# Patient Record
Sex: Female | Born: 1955 | Hispanic: Yes | Marital: Married | Smoking: Never smoker
Health system: Southern US, Community
[De-identification: ages and names within clinical notes are randomized; demographics above are authoritative.]

## PROBLEM LIST (undated history)

## (undated) DIAGNOSIS — N281 Cyst of kidney, acquired: Secondary | ICD-10-CM

## (undated) DIAGNOSIS — K5790 Diverticulosis of intestine, part unspecified, without perforation or abscess without bleeding: Secondary | ICD-10-CM

## (undated) DIAGNOSIS — E7800 Pure hypercholesterolemia, unspecified: Secondary | ICD-10-CM

## (undated) DIAGNOSIS — I2510 Atherosclerotic heart disease of native coronary artery without angina pectoris: Secondary | ICD-10-CM

## (undated) DIAGNOSIS — G4733 Obstructive sleep apnea (adult) (pediatric): Secondary | ICD-10-CM

## (undated) DIAGNOSIS — Z9889 Other specified postprocedural states: Secondary | ICD-10-CM

## (undated) DIAGNOSIS — I471 Supraventricular tachycardia, unspecified: Secondary | ICD-10-CM

## (undated) DIAGNOSIS — E559 Vitamin D deficiency, unspecified: Secondary | ICD-10-CM

## (undated) DIAGNOSIS — I2089 Other forms of angina pectoris: Secondary | ICD-10-CM

## (undated) DIAGNOSIS — N393 Stress incontinence (female) (male): Secondary | ICD-10-CM

## (undated) DIAGNOSIS — A048 Other specified bacterial intestinal infections: Secondary | ICD-10-CM

## (undated) DIAGNOSIS — I5181 Takotsubo syndrome: Secondary | ICD-10-CM

## (undated) DIAGNOSIS — I701 Atherosclerosis of renal artery: Secondary | ICD-10-CM

## (undated) DIAGNOSIS — K219 Gastro-esophageal reflux disease without esophagitis: Secondary | ICD-10-CM

## (undated) DIAGNOSIS — F419 Anxiety disorder, unspecified: Secondary | ICD-10-CM

## (undated) DIAGNOSIS — I10 Essential (primary) hypertension: Secondary | ICD-10-CM

## (undated) DIAGNOSIS — R000 Tachycardia, unspecified: Secondary | ICD-10-CM

## (undated) DIAGNOSIS — N644 Mastodynia: Secondary | ICD-10-CM

## (undated) DIAGNOSIS — E871 Hypo-osmolality and hyponatremia: Secondary | ICD-10-CM

## (undated) DIAGNOSIS — I5189 Other ill-defined heart diseases: Secondary | ICD-10-CM

## (undated) DIAGNOSIS — G4700 Insomnia, unspecified: Secondary | ICD-10-CM

## (undated) DIAGNOSIS — I219 Acute myocardial infarction, unspecified: Secondary | ICD-10-CM

## (undated) DIAGNOSIS — G894 Chronic pain syndrome: Secondary | ICD-10-CM

## (undated) DIAGNOSIS — E785 Hyperlipidemia, unspecified: Secondary | ICD-10-CM

## (undated) DIAGNOSIS — I480 Paroxysmal atrial fibrillation: Secondary | ICD-10-CM

## (undated) DIAGNOSIS — N993 Prolapse of vaginal vault after hysterectomy: Secondary | ICD-10-CM

## (undated) DIAGNOSIS — R0600 Dyspnea, unspecified: Secondary | ICD-10-CM

## (undated) DIAGNOSIS — I252 Old myocardial infarction: Secondary | ICD-10-CM

## (undated) DIAGNOSIS — R7303 Prediabetes: Secondary | ICD-10-CM

## (undated) HISTORY — DX: Old myocardial infarction: I25.2

## (undated) HISTORY — DX: Essential (primary) hypertension: I10

## (undated) HISTORY — DX: Paroxysmal atrial fibrillation: I48.0

## (undated) HISTORY — DX: Atherosclerosis of renal artery: I70.1

## (undated) HISTORY — DX: Hyperlipidemia, unspecified: E78.5

## (undated) HISTORY — DX: Other specified postprocedural states: Z98.890

## (undated) HISTORY — DX: Stress incontinence (female) (male): N39.3

## (undated) HISTORY — DX: Prolapse of vaginal vault after hysterectomy: N99.3

## (undated) HISTORY — DX: Mastodynia: N64.4

## (undated) HISTORY — DX: Tachycardia, unspecified: R00.0

## (undated) HISTORY — PX: VAGINAL PROLAPSE REPAIR: SHX830

## (undated) HISTORY — PX: CLAVICLE SURGERY: SHX598

## (undated) HISTORY — DX: Acute myocardial infarction, unspecified: I21.9

---

## 1996-05-26 DIAGNOSIS — I219 Acute myocardial infarction, unspecified: Secondary | ICD-10-CM

## 1996-05-26 DIAGNOSIS — I2510 Atherosclerotic heart disease of native coronary artery without angina pectoris: Secondary | ICD-10-CM

## 1996-05-26 DIAGNOSIS — I252 Old myocardial infarction: Secondary | ICD-10-CM

## 1996-05-26 HISTORY — PX: NECK SURGERY: SHX720

## 1996-05-26 HISTORY — PX: CORONARY BALLOON ANGIOPLASTY: CATH118233

## 1996-05-26 HISTORY — DX: Atherosclerotic heart disease of native coronary artery without angina pectoris: I25.10

## 1996-05-26 HISTORY — DX: Acute myocardial infarction, unspecified: I21.9

## 1996-05-26 HISTORY — DX: Old myocardial infarction: I25.2

## 1996-05-26 HISTORY — PX: CERVICAL SPINE SURGERY: SHX589

## 1998-08-16 ENCOUNTER — Observation Stay (HOSPITAL_COMMUNITY): Admission: RE | Admit: 1998-08-16 | Discharge: 1998-08-17 | Payer: Self-pay | Admitting: Neurosurgery

## 1998-08-16 ENCOUNTER — Encounter: Payer: Self-pay | Admitting: Neurosurgery

## 1998-09-07 ENCOUNTER — Ambulatory Visit (HOSPITAL_COMMUNITY): Admission: RE | Admit: 1998-09-07 | Discharge: 1998-09-07 | Payer: Self-pay | Admitting: Neurosurgery

## 2000-11-23 DIAGNOSIS — I214 Non-ST elevation (NSTEMI) myocardial infarction: Secondary | ICD-10-CM

## 2000-11-23 HISTORY — DX: Non-ST elevation (NSTEMI) myocardial infarction: I21.4

## 2000-11-23 HISTORY — PX: CORONARY BALLOON ANGIOPLASTY: CATH118233

## 2000-12-22 DIAGNOSIS — I2542 Coronary artery dissection: Secondary | ICD-10-CM

## 2000-12-22 HISTORY — DX: Coronary artery dissection: I25.42

## 2002-05-26 HISTORY — PX: ABDOMINAL HYSTERECTOMY: SHX81

## 2003-04-26 HISTORY — PX: LEFT HEART CATH AND CORONARY ANGIOGRAPHY: CATH118249

## 2004-05-30 ENCOUNTER — Ambulatory Visit: Payer: Self-pay | Admitting: Unknown Physician Specialty

## 2005-07-07 ENCOUNTER — Ambulatory Visit: Payer: Self-pay | Admitting: Internal Medicine

## 2008-10-05 ENCOUNTER — Ambulatory Visit: Payer: Self-pay

## 2012-09-08 ENCOUNTER — Ambulatory Visit: Payer: Self-pay

## 2012-09-13 ENCOUNTER — Ambulatory Visit: Payer: Self-pay

## 2012-10-05 ENCOUNTER — Encounter: Payer: Self-pay | Admitting: General Surgery

## 2012-10-05 ENCOUNTER — Other Ambulatory Visit: Payer: Self-pay

## 2012-10-05 ENCOUNTER — Ambulatory Visit (INDEPENDENT_AMBULATORY_CARE_PROVIDER_SITE_OTHER): Payer: Medicaid Other | Admitting: General Surgery

## 2012-10-05 VITALS — BP 120/70 | HR 78 | Resp 14 | Ht 64.0 in | Wt 157.0 lb

## 2012-10-05 DIAGNOSIS — N644 Mastodynia: Secondary | ICD-10-CM

## 2012-10-05 DIAGNOSIS — N63 Unspecified lump in unspecified breast: Secondary | ICD-10-CM

## 2012-10-05 NOTE — Patient Instructions (Addendum)
The patient is aware to use an antiinflammatory of  Aleve 2 times aday for an month.

## 2012-10-05 NOTE — Progress Notes (Signed)
Patient ID: Jenny Lee, female   DOB: [birth year], 57 y.o.   MRN: [id number]  Chief Complaint  Patient presents with  . Other    left breast pain    HPI Jenny Lee is a 57 y.o. female here today for left breast pain. Patient states the pain has been going on for three months in her upper outer quadrant. Patient had her mammogram 09/13/12 cat 2 . Never had any breast problems. Her sister was diagnosed with breast cancer at the age 25. The patient denies any history of trauma to the breast.  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   HPI  Past Medical History  Diagnosis Date  . Heart attack     2001    Past Surgical History  Procedure Laterality Date  . Abdominal hysterectomy  2004  . Neck surgery  1998    Family History  Problem Relation Age of Onset  . Breast cancer Sister 74    Social History History  Substance Use Topics  . Smoking status: Current Every Day Smoker -- 0.50 packs/day for 5 years  . Smokeless tobacco: Never Used  . Alcohol Use: No    No Known Allergies  Current Outpatient Prescriptions  Medication Sig Dispense Refill  . aspirin 81 MG tablet Take 81 mg by mouth daily.      Marland Kitchen estradiol (ESTRACE) 0.5 MG tablet Take 0.5 mg by mouth daily.       No current facility-administered medications for this visit.    Review of Systems Review of Systems  Constitutional: Negative.   Respiratory: Negative.   Cardiovascular: Negative.     Blood pressure 120/70, pulse 78, resp. rate 14, height 5\' 4"  (1.626 m), weight 157 lb (71.215 kg).  Physical Exam Physical Exam  Constitutional: She appears well-developed and well-nourished.  Neck: Neck supple.  Cardiovascular: Normal rate, regular rhythm and normal heart sounds.   Pulmonary/Chest: Effort normal and breath sounds normal. Right breast exhibits no inverted nipple, no mass, no nipple discharge, no skin change and no tenderness. Left breast exhibits no inverted nipple,  no mass, no nipple discharge, no skin change and no tenderness. Breasts are symmetrical.  Lymphadenopathy:       Right axillary: No pectoral and no lateral adenopathy present.  Tender at the base of the left axilla   There is mild tenderness in the axillary tail bilaterally. This is slightly more pronounced in the left axillary tail. There is also some mild tenderness along serrate his muscle on the left side. There is mild thickening in the axillary tail on the left more so than the right.  Data Reviewed Bilateral mammograms dated 09/13/2012 were compared to previous studies completed in 2010. No discernible mass effect, architectural distortion or microcalcifications of concern identified. Heterogeneously dense. BI-RAD-2.  Ultrasound examination was completed of the upper-outer quadrant of the left breast due to the patient's report of pain and focal thickening on exam. Examination in the 1:00 position 10 cm from the nipple showed a irregular isoechoic/hyperechoic area with focal shadowing measuring 0.78 x 1.16 x 1.4 cm. This was near the periphery of the breast parenchyma towards the axilla. No other cystic or solid areas were appreciated. There is mild tenderness with compression by the ultrasound probe over this area.  2006 hysterectomy pathology: SECRETORY ENDOMETRIUM, BODY OF UTERUS (MORCELLATED/SUPRACERVICAL). ADENOMYOSIS. OVARY WITH CORPUS LUTEUM CYST, FOLLICULAR CYSTS AND FIBROUS ADHESIONS, LEFT. FALLOPIAN TUBE WITH FIBROUS ADHESIONS, LEFT. KCT/05/31/2004 .                               [  Final Report         ]                   Electronically signed:                                          Skip Mayer, MD, FCAP, Pathologist   Assessment    The ultrasound findings are indeterminate, and may be related to focal irritation.    Plan    The patient has been asked to make use of a trial Aleve 2 tablets twice a day with a small amount of food for the next month. We'll  plan for reassessment 6 weeks to see her response to conservative treatment.       Jenny Lee 10/06/2012, 7:25 AM

## 2012-10-06 ENCOUNTER — Encounter: Payer: Self-pay | Admitting: General Surgery

## 2012-10-06 DIAGNOSIS — N644 Mastodynia: Secondary | ICD-10-CM | POA: Insufficient documentation

## 2012-10-06 HISTORY — DX: Mastodynia: N64.4

## 2012-11-16 ENCOUNTER — Ambulatory Visit: Payer: Medicaid Other | Admitting: General Surgery

## 2012-12-14 ENCOUNTER — Encounter: Payer: Self-pay | Admitting: *Deleted

## 2014-03-27 ENCOUNTER — Encounter: Payer: Self-pay | Admitting: General Surgery

## 2014-08-15 DIAGNOSIS — N393 Stress incontinence (female) (male): Secondary | ICD-10-CM | POA: Insufficient documentation

## 2014-08-15 DIAGNOSIS — N993 Prolapse of vaginal vault after hysterectomy: Secondary | ICD-10-CM

## 2014-08-15 DIAGNOSIS — Z01818 Encounter for other preprocedural examination: Secondary | ICD-10-CM | POA: Insufficient documentation

## 2014-08-15 DIAGNOSIS — I252 Old myocardial infarction: Secondary | ICD-10-CM

## 2014-08-15 HISTORY — DX: Old myocardial infarction: I25.2

## 2014-08-15 HISTORY — DX: Prolapse of vaginal vault after hysterectomy: N99.3

## 2014-08-15 HISTORY — DX: Stress incontinence (female) (male): N39.3

## 2015-10-31 ENCOUNTER — Ambulatory Visit (INDEPENDENT_AMBULATORY_CARE_PROVIDER_SITE_OTHER): Payer: BLUE CROSS/BLUE SHIELD | Admitting: Podiatry

## 2015-10-31 ENCOUNTER — Ambulatory Visit (INDEPENDENT_AMBULATORY_CARE_PROVIDER_SITE_OTHER): Payer: BLUE CROSS/BLUE SHIELD

## 2015-10-31 ENCOUNTER — Ambulatory Visit: Payer: Self-pay

## 2015-10-31 ENCOUNTER — Encounter: Payer: Self-pay | Admitting: Podiatry

## 2015-10-31 VITALS — BP 132/85 | HR 73 | Resp 16

## 2015-10-31 DIAGNOSIS — M898X9 Other specified disorders of bone, unspecified site: Secondary | ICD-10-CM | POA: Diagnosis not present

## 2015-10-31 DIAGNOSIS — Q828 Other specified congenital malformations of skin: Secondary | ICD-10-CM | POA: Diagnosis not present

## 2015-10-31 DIAGNOSIS — B079 Viral wart, unspecified: Secondary | ICD-10-CM | POA: Diagnosis not present

## 2015-10-31 DIAGNOSIS — M2010 Hallux valgus (acquired), unspecified foot: Secondary | ICD-10-CM | POA: Diagnosis not present

## 2015-10-31 DIAGNOSIS — M2040 Other hammer toe(s) (acquired), unspecified foot: Secondary | ICD-10-CM | POA: Diagnosis not present

## 2015-10-31 DIAGNOSIS — M779 Enthesopathy, unspecified: Secondary | ICD-10-CM

## 2015-10-31 DIAGNOSIS — M79671 Pain in right foot: Secondary | ICD-10-CM

## 2015-10-31 NOTE — Progress Notes (Signed)
   Subjective:    Patient ID: Jenny Lee, female    DOB: [birth year], 60 y.o.   MRN: [id number]  HPI: She presents today with chief complaint of a painful nodule beneath the hallux of the right foot. She states it is been there before and has been burned off by another doctor but it came back. She is wondering if there is anything we can do to prevent it from coming back again. She's also has pain beneath her second metatarsophalangeal joints bilaterally left worse than the right she says these calluses are painful and she refers to reactive hyperkeratotic lesion plantar aspect of the bilateral second metatarsals. She states that it hurts to bend her second toe on the left foot. She's also states that it hurts to bend all of her toes 2 through 4 on both feet at the level of the PIPJ's because they're stiff. 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    Review of Systems  All other systems reviewed and are negative.      Objective:   Physical Exam: Vital signs are stable alert and oriented 3 pulses are strongly palpable. Neurologic sensorium is intact. Deep tendon reflexes are intact. Muscle strength +5 over 5 dorsiflexion plantar flexors and inverters everters all into the musculature is intact. Orthopedic evaluation and x-rays all joints distal to the ankle range of motion without crepitus she has hallux abductovalgus deformity and rigid hammertoe deformities bilateral. She has pain on end range of motion the second metatarsophalangeal joint of the left foot with some edema in the area. No erythema cellulitis drainage or odor no open lesions or wounds. Cutaneous evaluation does demonstrate broad callus or tyloma beneath the second metatarsophalangeal joints bilaterally. She also has a raised keratotic lesion on the plantar aspect of the proximal phalanx hallux right is it is exquisitely tender. I do not demonstrate any type of thrombosed capillaries this appears to be a  porokeratotic or cornified lesion I do think that this is something that we should excise and sent for pathology.        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 Verrucoid lesion versus porokeratotic lesion plantar aspect of the hallux right.  Plan: I injected her second metatarsophalangeal joint periarticular Truman Hayward today with Kenalog and local anesthetic left foot. I also had her scan for a set of orthotics. I also performed surgical curettage to the hallux right. And debridement all reactive hyperkeratotic lesions. She was provided with both oral and written home going instructions for care and soaking of her toe and I will follow-up with her in a couple of weeks for reevaluation lesion was then sent for pathologic evaluation.

## 2015-10-31 NOTE — Patient Instructions (Signed)

## 2015-11-12 ENCOUNTER — Ambulatory Visit (INDEPENDENT_AMBULATORY_CARE_PROVIDER_SITE_OTHER): Payer: BLUE CROSS/BLUE SHIELD | Admitting: Podiatry

## 2015-11-12 ENCOUNTER — Encounter: Payer: Self-pay | Admitting: Podiatry

## 2015-11-12 DIAGNOSIS — B079 Viral wart, unspecified: Secondary | ICD-10-CM

## 2015-11-12 NOTE — Patient Instructions (Signed)

## 2015-11-12 NOTE — Progress Notes (Addendum)
She presents today for follow-up of a painful wart excision right forefoot She states is still sore. She states it is still sore and she can believe that is growing back already.  Objective: Vital signs are stable she is alert and oriented 3. Pulses are palpable. Neurologic sensorium is intact. No erythema edema cellulose drainage or odor. The excision site appears to be healing uneventfully. There are hyperkeratotic plug is already present.  Assessment: well-healing surgical foot right.  Plan: I debrided the area today and placed small amount of Cantharone to assistant blistering the wound. She still leave this on until tomorrow washed off thoroughly.

## 2015-11-20 ENCOUNTER — Telehealth: Payer: Self-pay | Admitting: *Deleted

## 2015-11-20 NOTE — Telephone Encounter (Addendum)
Dr. Milinda Pointer reviewed 10/31/2015 biopsy results as wart.  Unable to leave a message number had been disconnected. Left message for pt to call for results. Pt returned my call, informed pt of Dr. Stephenie Acres statement.  Pt states the surgery site is doing fine.  Gave pt next appt date as 12/07/2015 at 1100am to pick up orthotics in [place].

## 2015-11-25 ENCOUNTER — Observation Stay
Admission: EM | Admit: 2015-11-25 | Discharge: 2015-11-27 | Disposition: A | Discharge status: Home / Self Care | Payer: BLUE CROSS/BLUE SHIELD | Attending: Internal Medicine | Admitting: Internal Medicine

## 2015-11-25 DIAGNOSIS — Z7982 Long term (current) use of aspirin: Secondary | ICD-10-CM | POA: Diagnosis not present

## 2015-11-25 DIAGNOSIS — I10 Essential (primary) hypertension: Secondary | ICD-10-CM | POA: Diagnosis not present

## 2015-11-25 DIAGNOSIS — I253 Aneurysm of heart: Secondary | ICD-10-CM | POA: Diagnosis not present

## 2015-11-25 DIAGNOSIS — I252 Old myocardial infarction: Secondary | ICD-10-CM | POA: Insufficient documentation

## 2015-11-25 DIAGNOSIS — I25119 Atherosclerotic heart disease of native coronary artery with unspecified angina pectoris: Secondary | ICD-10-CM | POA: Diagnosis present

## 2015-11-25 DIAGNOSIS — R55 Syncope and collapse: Secondary | ICD-10-CM | POA: Diagnosis not present

## 2015-11-25 DIAGNOSIS — R079 Chest pain, unspecified: Secondary | ICD-10-CM

## 2015-11-25 DIAGNOSIS — Z79890 Hormone replacement therapy (postmenopausal): Secondary | ICD-10-CM | POA: Diagnosis not present

## 2015-11-25 DIAGNOSIS — E785 Hyperlipidemia, unspecified: Secondary | ICD-10-CM | POA: Insufficient documentation

## 2015-11-25 DIAGNOSIS — F329 Major depressive disorder, single episode, unspecified: Secondary | ICD-10-CM | POA: Insufficient documentation

## 2015-11-25 DIAGNOSIS — Z79899 Other long term (current) drug therapy: Secondary | ICD-10-CM | POA: Insufficient documentation

## 2015-11-25 DIAGNOSIS — F17210 Nicotine dependence, cigarettes, uncomplicated: Secondary | ICD-10-CM | POA: Insufficient documentation

## 2015-11-25 DIAGNOSIS — Z23 Encounter for immunization: Secondary | ICD-10-CM | POA: Insufficient documentation

## 2015-11-25 DIAGNOSIS — E7800 Pure hypercholesterolemia, unspecified: Secondary | ICD-10-CM | POA: Insufficient documentation

## 2015-11-25 HISTORY — DX: Essential (primary) hypertension: I10

## 2015-11-25 HISTORY — DX: Pure hypercholesterolemia, unspecified: E78.00

## 2015-11-26 ENCOUNTER — Emergency Department: Payer: BLUE CROSS/BLUE SHIELD

## 2015-11-26 ENCOUNTER — Encounter
Admission: EM | Disposition: A | Discharge status: Home / Self Care | Payer: Self-pay | Source: Home / Self Care | Attending: Internal Medicine

## 2015-11-26 ENCOUNTER — Encounter: Payer: Self-pay | Admitting: Emergency Medicine

## 2015-11-26 DIAGNOSIS — Z9889 Other specified postprocedural states: Secondary | ICD-10-CM

## 2015-11-26 DIAGNOSIS — R55 Syncope and collapse: Secondary | ICD-10-CM | POA: Diagnosis present

## 2015-11-26 HISTORY — DX: Other specified postprocedural states: Z98.890

## 2015-11-26 HISTORY — PX: CARDIAC CATHETERIZATION: SHX172

## 2015-11-26 HISTORY — DX: Syncope and collapse: R55

## 2015-11-26 LAB — CBC WITH DIFFERENTIAL/PLATELET
BASOS PCT: 1 %
Basophils Absolute: 0.1 10*3/uL (ref 0–0.1)
EOS ABS: 0.1 10*3/uL (ref 0–0.7)
EOS PCT: 1 %
HCT: 36 % (ref 35.0–47.0)
Hemoglobin: 12.4 g/dL (ref 12.0–16.0)
LYMPHS ABS: 3.6 10*3/uL (ref 1.0–3.6)
Lymphocytes Relative: 37 %
MCH: 28.9 pg (ref 26.0–34.0)
MCHC: 34.3 g/dL (ref 32.0–36.0)
MCV: 84.3 fL (ref 80.0–100.0)
Monocytes Absolute: 0.6 10*3/uL (ref 0.2–0.9)
Monocytes Relative: 6 %
Neutro Abs: 5.3 10*3/uL (ref 1.4–6.5)
Neutrophils Relative %: 55 %
PLATELETS: 229 10*3/uL (ref 150–440)
RBC: 4.28 MIL/uL (ref 3.80–5.20)
RDW: 14 % (ref 11.5–14.5)
WBC: 9.8 10*3/uL (ref 3.6–11.0)

## 2015-11-26 LAB — COMPREHENSIVE METABOLIC PANEL
ALBUMIN: 4.3 g/dL (ref 3.5–5.0)
ALT: 19 U/L (ref 14–54)
ANION GAP: 6 (ref 5–15)
AST: 25 U/L (ref 15–41)
Alkaline Phosphatase: 60 U/L (ref 38–126)
BUN: 16 mg/dL (ref 6–20)
CHLORIDE: 105 mmol/L (ref 101–111)
CO2: 26 mmol/L (ref 22–32)
Calcium: 9.2 mg/dL (ref 8.9–10.3)
Creatinine, Ser: 0.76 mg/dL (ref 0.44–1.00)
GFR calc non Af Amer: >60 mL/min (ref >60)
GLUCOSE: 152 mg/dL — AB (ref 65–99)
Potassium: 3.6 mmol/L (ref 3.5–5.1)
SODIUM: 137 mmol/L (ref 135–145)
Total Bilirubin: 0.4 mg/dL (ref 0.3–1.2)
Total Protein: 7.2 g/dL (ref 6.5–8.1)

## 2015-11-26 LAB — ETHANOL: Alcohol, Ethyl (B): <5 mg/dL (ref <5)

## 2015-11-26 LAB — TROPONIN I
Troponin I: <0.03 ng/mL (ref <0.03)
Troponin I: <0.03 ng/mL (ref <0.03)
Troponin I: <0.03 ng/mL (ref <0.03)

## 2015-11-26 LAB — TSH: TSH: 1.073 u[IU]/mL (ref 0.350–4.500)

## 2015-11-26 LAB — HEMOGLOBIN A1C: Hgb A1c MFr Bld: 6.2 % — ABNORMAL HIGH (ref 4.0–6.0)

## 2015-11-26 SURGERY — LEFT HEART CATH AND CORONARY ANGIOGRAPHY
Anesthesia: Moderate Sedation

## 2015-11-26 MED ORDER — MIDAZOLAM HCL 2 MG/2ML IJ SOLN
INTRAMUSCULAR | Status: AC
  Filled 2015-11-26: qty 2

## 2015-11-26 MED ORDER — SIMVASTATIN 20 MG PO TABS
20.0000 mg | ORAL_TABLET | Freq: Every day | ORAL | Status: DC
  Administered 2015-11-26: 20 mg via ORAL
  Filled 2015-11-26: qty 1

## 2015-11-26 MED ORDER — ACETAMINOPHEN 650 MG RE SUPP: 650.0000 mg | Freq: Four times a day (QID) | RECTAL | Status: DC | PRN

## 2015-11-26 MED ORDER — FENTANYL CITRATE (PF) 100 MCG/2ML IJ SOLN
INTRAMUSCULAR | Status: AC
  Filled 2015-11-26: qty 2

## 2015-11-26 MED ORDER — SODIUM CHLORIDE 0.9% FLUSH: 3.0000 mL | Freq: Two times a day (BID) | INTRAVENOUS | Status: DC

## 2015-11-26 MED ORDER — ONDANSETRON HCL 4 MG/2ML IJ SOLN: 4.0000 mg | Freq: Four times a day (QID) | INTRAMUSCULAR | Status: DC | PRN

## 2015-11-26 MED ORDER — MORPHINE SULFATE (PF) 2 MG/ML IV SOLN: 2.0000 mg | INTRAVENOUS | Status: DC | PRN

## 2015-11-26 MED ORDER — SODIUM CHLORIDE 0.9 % WEIGHT BASED INFUSION: 3.0000 mL/kg/h | INTRAVENOUS | Status: DC

## 2015-11-26 MED ORDER — SODIUM CHLORIDE 0.9 % WEIGHT BASED INFUSION: 1.0000 mL/kg/h | INTRAVENOUS | Status: DC

## 2015-11-26 MED ORDER — SODIUM CHLORIDE 0.9 % WEIGHT BASED INFUSION: 1.0000 mL/kg/h | INTRAVENOUS | Status: AC

## 2015-11-26 MED ORDER — ESTRADIOL 1 MG PO TABS
0.5000 mg | ORAL_TABLET | Freq: Every day | ORAL | Status: DC
Start: 2015-11-26 — End: 2015-11-27
  Administered 2015-11-26 – 2015-11-27 (×2): 0.5 mg via ORAL
  Filled 2015-11-26: qty 1
  Filled 2015-11-26: qty 0.5

## 2015-11-26 MED ORDER — ONDANSETRON HCL 4 MG PO TABS: 4.0000 mg | ORAL_TABLET | Freq: Four times a day (QID) | ORAL | Status: DC | PRN

## 2015-11-26 MED ORDER — ENOXAPARIN SODIUM 40 MG/0.4ML ~~LOC~~ SOLN
40.0000 mg | SUBCUTANEOUS | Status: DC
  Administered 2015-11-26: 40 mg via SUBCUTANEOUS
  Filled 2015-11-26: qty 0.4

## 2015-11-26 MED ORDER — DOCUSATE SODIUM 100 MG PO CAPS
100.0000 mg | ORAL_CAPSULE | Freq: Two times a day (BID) | ORAL | Status: DC
  Administered 2015-11-26 – 2015-11-27 (×3): 100 mg via ORAL
  Filled 2015-11-26 (×3): qty 1

## 2015-11-26 MED ORDER — ACETAMINOPHEN 325 MG PO TABS
650.0000 mg | ORAL_TABLET | Freq: Four times a day (QID) | ORAL | Status: DC | PRN
Start: 2015-11-26 — End: 2015-11-27
  Administered 2015-11-26 (×2): 650 mg via ORAL
  Filled 2015-11-26 (×2): qty 2

## 2015-11-26 MED ORDER — IOPAMIDOL (ISOVUE-300) INJECTION 61%
INTRAVENOUS | Status: DC | PRN
  Administered 2015-11-26: 80 mL via INTRA_ARTERIAL

## 2015-11-26 MED ORDER — ASPIRIN 81 MG PO CHEW: 81.0000 mg | CHEWABLE_TABLET | ORAL | Status: DC

## 2015-11-26 MED ORDER — HEPARIN (PORCINE) IN NACL 2-0.9 UNIT/ML-% IJ SOLN
INTRAMUSCULAR | Status: AC
  Filled 2015-11-26: qty 500

## 2015-11-26 MED ORDER — PNEUMOCOCCAL VAC POLYVALENT 25 MCG/0.5ML IJ INJ
0.5000 mL | INJECTION | INTRAMUSCULAR | Status: AC
  Administered 2015-11-27: 0.5 mL via INTRAMUSCULAR
  Filled 2015-11-26: qty 0.5

## 2015-11-26 MED ORDER — SODIUM CHLORIDE 0.9% FLUSH: 3.0000 mL | INTRAVENOUS | Status: DC | PRN

## 2015-11-26 MED ORDER — ASPIRIN EC 81 MG PO TBEC
81.0000 mg | DELAYED_RELEASE_TABLET | Freq: Every day | ORAL | Status: DC
  Administered 2015-11-26 – 2015-11-27 (×2): 81 mg via ORAL
  Filled 2015-11-26 (×2): qty 1

## 2015-11-26 MED ORDER — SODIUM CHLORIDE 0.9 % IV SOLN: 250.0000 mL | INTRAVENOUS | Status: DC | PRN

## 2015-11-26 MED ORDER — SODIUM CHLORIDE 0.9% FLUSH
3.0000 mL | Freq: Two times a day (BID) | INTRAVENOUS | Status: DC
  Administered 2015-11-26 – 2015-11-27 (×2): 3 mL via INTRAVENOUS

## 2015-11-26 MED ORDER — OMEGA-3-ACID ETHYL ESTERS 1 G PO CAPS: 2.0000 g | ORAL_CAPSULE | Freq: Every day | ORAL | Status: DC

## 2015-11-26 MED ORDER — MIDAZOLAM HCL 2 MG/2ML IJ SOLN
INTRAMUSCULAR | Status: DC | PRN
  Administered 2015-11-26 (×2): 1 mg via INTRAVENOUS

## 2015-11-26 MED ORDER — METOPROLOL TARTRATE 25 MG PO TABS
25.0000 mg | ORAL_TABLET | Freq: Every day | ORAL | Status: DC
  Administered 2015-11-26 – 2015-11-27 (×2): 25 mg via ORAL
  Filled 2015-11-26 (×2): qty 1

## 2015-11-26 MED ORDER — ASPIRIN 81 MG PO CHEW
324.0000 mg | CHEWABLE_TABLET | Freq: Once | ORAL | Status: AC
  Administered 2015-11-26: 324 mg via ORAL
  Filled 2015-11-26: qty 4

## 2015-11-26 MED ORDER — FENTANYL CITRATE (PF) 100 MCG/2ML IJ SOLN
INTRAMUSCULAR | Status: DC | PRN
  Administered 2015-11-26 (×2): 25 ug via INTRAVENOUS

## 2015-11-26 MED ORDER — ACETAMINOPHEN 325 MG PO TABS: 650.0000 mg | ORAL_TABLET | ORAL | Status: DC | PRN

## 2015-11-26 MED ORDER — SERTRALINE HCL 50 MG PO TABS
50.0000 mg | ORAL_TABLET | Freq: Every day | ORAL | Status: DC
  Administered 2015-11-26: 50 mg via ORAL
  Filled 2015-11-26: qty 1

## 2015-11-26 SURGICAL SUPPLY — 9 items
CATH INFINITI 5FR ANG PIGTAIL (CATHETERS) ×2 IMPLANT
CATH INFINITI 5FR JL4 (CATHETERS) ×2 IMPLANT
CATH INFINITI JR4 5F (CATHETERS) ×2 IMPLANT
KIT MANI 3VAL PERCEP (MISCELLANEOUS) ×3 IMPLANT
NDL PERC 18GX7CM (NEEDLE) IMPLANT
NEEDLE PERC 18GX7CM (NEEDLE) ×3 IMPLANT
PACK CARDIAC CATH (CUSTOM PROCEDURE TRAY) ×3 IMPLANT
SHEATH PINNACLE 5F 10CM (SHEATH) ×2 IMPLANT
WIRE EMERALD 3MM-J .035X150CM (WIRE) ×2 IMPLANT

## 2015-11-26 NOTE — ED Notes (Signed)
Per EMS, patient was home sitting in a chair and all of a sudden she felt flush and diaphoretic.  Patient felt weak and was sliding out of her chair but her son kept her from hitting the floor.  Patient denies LOC.  She presents AOx4 and able to answer questions.  She is spooked by this happening.  EMS says this patient had 4 MI's at age 60.  She has been taking a prescription for neck muscle spasms and is on her second refill.  The family is trying to get the name of the medication.

## 2015-11-26 NOTE — Progress Notes (Signed)
Patient had cardiac catheterization done without complication and findings revealed normal coronaries but left radical ejection fraction 35%. 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 Advise lifevest which has been arranged with the nurse manager. Patient should not be discharged and to lifevest has been installed. Also will start the patient on Entresto.

## 2015-11-26 NOTE — ED Notes (Signed)
XR at bedside

## 2015-11-26 NOTE — Discharge Summary (Addendum)
[facility] NAME: Jenny Lee    MR#:  [id number]  DATE OF BIRTH:  16-Jul-1955  DATE OF ADMISSION:  11/25/2015 ADMITTING PHYSICIAN: Harrie Foreman, MD  DATE OF DISCHARGE: 11/26/2015  PRIMARY CARE PHYSICIAN: Casilda Carls, MD    ADMISSION DIAGNOSIS:  Near syncope [R55] Coronary artery disease involving native coronary artery of native heart with angina pectoris ([place]) [I25.119]  DISCHARGE DIAGNOSIS:  Active Problems:   Syncope    CAD ruled out but have aneurysm on apical region and Low EF  SECONDARY DIAGNOSIS:   Past Medical History  Diagnosis Date  . Heart attack (Jenny Lee)     2001  . Hypertension   . Hypercholesteremia     HOSPITAL COURSE:   1. Syncope:   Telemetry and troponin negative   She had a negative stress test recently in the office so that Dr. Humphrey Rolls decided to do cardiac catheterization, which showed decreased ejection fraction and aneurysm in the Apical region. Sensory suggested to discharge with LifeVest and and enteresto, advised - but her Blood pressure is running on lower normal side, so we will not start now, it can be done in future , if BP stable.    2. Coronary artery disease: Stable; no chest pain. Troponin is negative and EKG shows no signs of ischemia. Patient reports that she recently underwent an exercise stress test which was normal. Cardiology has been consulted, cath done. Continue aspirin 3. Essential hypertension: Controlled; continue metoprolol. 4. Hyperlipidemia: Continue statin therapy 5. Depression: Continue Zoloft 6. DVT prophylaxis: Lovenox 7. GI prophylaxis: None  DISCHARGE CONDITIONS:   Stable.  CONSULTS OBTAINED:  Treatment Team:  Dionisio David, MD  DRUG ALLERGIES:  No Known Allergies  DISCHARGE MEDICATIONS:   Current Discharge Medication List    CONTINUE these medications which have NOT CHANGED   Details  aspirin 81 MG tablet Take 81 mg by mouth daily.     estradiol (ESTRACE) 0.5 MG tablet Take 0.5 mg by mouth daily.    metoprolol tartrate (LOPRESSOR) 25 MG tablet Take 25 mg by mouth daily.  Refills: 4    omega-3 acid ethyl esters (LOVAZA) 1 g capsule Take 2 g by mouth daily at 3 pm.     sertraline (ZOLOFT) 50 MG tablet Take 50 mg by mouth at bedtime.     simvastatin (ZOCOR) 20 MG tablet Take 20 mg by mouth daily at 6 PM.  Refills: 4         DISCHARGE INSTRUCTIONS:    Follow with cardiology clinic in 1 weeks.  If you experience worsening of your admission symptoms, develop shortness of breath, life threatening emergency, suicidal or homicidal thoughts you must seek medical attention immediately by calling 911 or calling your MD immediately  if symptoms less severe.  You Must read complete instructions/literature along with all the possible adverse reactions/side effects for all the Medicines you take and that have been prescribed to you. Take any new Medicines after you have completely understood and accept all the possible adverse reactions/side effects.   Please note  You were cared for by a hospitalist during your hospital stay. If you have any questions about your discharge medications or the care you received while you were in the hospital after you are discharged, you can call the unit and asked to speak with the hospitalist on call if the hospitalist that took care of you is not available. Once you are discharged, your primary care physician  will handle any further medical issues. Please note that NO REFILLS for any discharge medications will be authorized once you are discharged, as it is imperative that you return to your primary care physician (or establish a relationship with a primary care physician if you do not have one) for your aftercare needs so that they can reassess your need for medications and monitor your lab values.    Today   CHIEF COMPLAINT:   Chief Complaint  Patient presents with  . Near Syncope     HISTORY OF PRESENT ILLNESS:  Jenny Lee  is a 60 y.o. female with a known history of coronary artery disease presents to the emergency department after a fainting episode. She states that she recalls feeling nauseous and diaphoretic while sitting. She stood to walk upstairs and gradually felt herself becoming more lightheaded. Her husband reports that he caught her before she fell. The patient did not hit her head. Loss of consciousness lasted approximately 30 seconds to 1 minute. The patient denies chest pain, shortness of breath, vomiting or diarrhea. She admits to feeling pressure in her throat which has eased to some degree but is still present. The patient reports that she had chest pain over her left chest when she has had heart attacks in the past. She denies any symptoms similar to her previous acute coronary events. Due to her cardiovascular risk the emergency department staff called the hospitalist service for admission.   VITAL SIGNS:  Blood pressure 115/58, pulse 57, temperature 97.8 F (36.6 C), temperature source Oral, resp. rate 14, height 5\' 4"  (1.626 m), weight 69.945 kg (154 lb 3.2 oz), SpO2 96 %.  I/O:   Intake/Output Summary (Last 24 hours) at 11/26/15 1604 Last data filed at 11/26/15 0900  Gross per 24 hour  Intake      0 ml  Output    250 ml  Net   -250 ml    PHYSICAL EXAMINATION:  GENERAL:  60 y.o.-year-old patient lying in the bed with no acute distress.  EYES: Pupils equal, round, reactive to light and accommodation. No scleral icterus. Extraocular muscles intact.  HEENT: Head atraumatic, normocephalic. Oropharynx and nasopharynx clear.  NECK:  Supple, no jugular venous distention. No thyroid enlargement, no tenderness.  LUNGS: Normal breath sounds bilaterally, no wheezing, rales,rhonchi or crepitation. No use of accessory muscles of respiration.  CARDIOVASCULAR: S1, S2 normal. No murmurs, rubs, or gallops.  ABDOMEN: Soft, non-tender, non-distended. Bowel  sounds present. No organomegaly or mass.  EXTREMITIES: No pedal edema, cyanosis, or clubbing.  NEUROLOGIC: Cranial nerves II through XII are intact. Muscle strength 5/5 in all extremities. Sensation intact. Gait not checked.  PSYCHIATRIC: The patient is alert and oriented x 3.  SKIN: No obvious rash, lesion, or ulcer.   DATA REVIEW:   CBC  Recent Labs Lab 11/26/15 0046  WBC 9.8  HGB 12.4  HCT 36.0  PLT 229    Chemistries   Recent Labs Lab 11/26/15 0046  NA 137  K 3.6  CL 105  CO2 26  GLUCOSE 152*  BUN 16  CREATININE 0.76  CALCIUM 9.2  AST 25  ALT 19  ALKPHOS 60  BILITOT 0.4    Cardiac Enzymes  Recent Labs Lab 11/26/15 1047  TROPONINI <0.03    Microbiology Results  No results found for this or any previous visit.  RADIOLOGY:  Dg Chest Port 1 View  11/26/2015  CLINICAL DATA:  Near syncope while sitting on a chair. EXAM: PORTABLE CHEST 1 VIEW COMPARISON:  Report from chest radiograph 07/12/2002, images not available. FINDINGS: The cardiomediastinal contours are normal. The lungs are clear. Pulmonary vasculature is normal. No consolidation, pleural effusion, or pneumothorax. Broad-base dextroscoliosis of the thoracic spine. No acute osseous abnormalities are seen. IMPRESSION: No acute pulmonary process. Electronically Signed   By: Jeb Levering M.D.   On: 11/26/2015 00:42    EKG:   Orders placed or performed during the hospital encounter of 11/25/15  . ED EKG  . ED EKG  . EKG 12-Lead  . EKG 12-Lead  . EKG 12-Lead  . EKG 12-Lead      Management plans discussed with the patient, family and they are in agreement.  CODE STATUS:     Code Status Orders        Start     Ordered   11/26/15 0455  Full code   Continuous     11/26/15 0454    Code Status History    Date Active Date Inactive Code Status Order ID Comments User Context   This patient has a current code status but no historical code status.      TOTAL TIME TAKING CARE OF THIS  PATIENT: 35 minutes.    Vaughan Basta M.D on 11/26/2015 at 4:04 PM  Between 7am to 6pm - Pager - [phone]  After 6pm go to [email or web address] - password [email or web address] [facility]  Office  [phone]  CC: Primary care physician; Casilda Carls, MD   Note: This dictation was prepared with Dragon dictation along with smaller phrase technology. Any transcriptional errors that result from this process are unintentional.

## 2015-11-26 NOTE — Progress Notes (Signed)
Patient complained of blood of bed pad. Assessed groin site for cardiac cath. Clean dry and intact. Per assessment blood coming from patient vaginally. Per patient she has not had menstrual cycle in a long time and has had a hysterectomy. Md notified. Acknowledged. New consult placed. Will continue to monitor.

## 2015-11-26 NOTE — Progress Notes (Signed)
Jenny Lee is a 60 y.o. female  [id number]  Primary Cardiologist: Neoma Laming Reason for Consultation: Chest pain and syncope  HPI: This is a [age]-year-old white female with a past medical history of hypertension hyperlipidemia apparently states had MI 2001 but no history of PCI or CABG. Patient presented with episode of passing out with complete loss of consciousness for 1 minute and 30 seconds according to her husband. Patient states she was having no chest pain but had severe jaw pain and felt like she was being choked. Along with this she had severe diaphoresis. All this happened before she passed out.  Review of Systems: No chest pain at this time no orthopnea PND or leg swelling.   Past Medical History  Diagnosis Date  . Heart attack ([place])     2001  . Hypertension   . Hypercholesteremia     Medications Prior to Admission  Medication Sig Dispense Refill  . aspirin 81 MG tablet Take 81 mg by mouth daily.    Marland Kitchen estradiol (ESTRACE) 0.5 MG tablet Take 0.5 mg by mouth daily.    . metoprolol tartrate (LOPRESSOR) 25 MG tablet Take 25 mg by mouth daily.   4  . omega-3 acid ethyl esters (LOVAZA) 1 g capsule Take 2 g by mouth daily at 3 pm.     . sertraline (ZOLOFT) 50 MG tablet Take 50 mg by mouth at bedtime.     . simvastatin (ZOCOR) 20 MG tablet Take 20 mg by mouth daily at 6 PM.   4     . aspirin EC  81 mg Oral Daily  . docusate sodium  100 mg Oral BID  . enoxaparin (LOVENOX) injection  40 mg Subcutaneous Q24H  . estradiol  0.5 mg Oral Daily  . metoprolol tartrate  25 mg Oral Daily  . omega-3 acid ethyl esters  2 g Oral Q1500  . [START ON 11/27/2015] pneumococcal 23 valent vaccine  0.5 mL Intramuscular Tomorrow-1000  . sertraline  50 mg Oral QHS  . simvastatin  20 mg Oral q1800  . sodium chloride flush  3 mL Intravenous Q12H  . sodium chloride flush  3 mL Intravenous Q12H    Infusions: . [START ON 11/27/2015] sodium chloride     Followed by  . [START ON 11/27/2015]  sodium chloride      No Known Allergies  Social History   Social History  . Marital Status: Married    Spouse Name: N/A  . Number of Children: N/A  . Years of Education: N/A   Occupational History  . Not on file.   Social History Main Topics  . Smoking status: Current Some Day Smoker -- 0.50 packs/day for 5 years  . Smokeless tobacco: Never Used  . Alcohol Use: No  . Drug Use: No  . Sexual Activity: Not on file   Other Topics Concern  . Not on file   Social History Narrative    Family History  Problem Relation Age of Onset  . Breast cancer Sister 39    PHYSICAL EXAM: Filed Vitals:   11/26/15 0735 11/26/15 1122  BP: 121/51 99/49  Pulse: 62 57  Temp:  97.8 F (36.6 C)  Resp: 20 16     Intake/Output Summary (Last 24 hours) at 11/26/15 1250 Last data filed at 11/26/15 0900  Gross per 24 hour  Intake      0 ml  Output    250 ml  Net   -250 ml  General:  Well appearing. No respiratory difficulty HEENT: normal Neck: supple. no JVD. Carotids 2+ bilat; no bruits. No lymphadenopathy or thryomegaly appreciated. Cor: PMI nondisplaced. Regular rate & rhythm. No rubs, gallops or murmurs. Lungs: clear Abdomen: soft, nontender, nondistended. No hepatosplenomegaly. No bruits or masses. Good bowel sounds. Extremities: no cyanosis, clubbing, rash, edema Neuro: alert & oriented x 3, cranial nerves grossly intact. moves all 4 extremities w/o difficulty. Affect pleasant.  ECG: Normal sinus rhythm and no acute ST changes  Results for orders placed or performed during the hospital encounter of 11/25/15 (from the past 24 hour(s))  CBC with Differential     Status: None   Collection Time: 11/26/15 12:46 AM  Result Value Ref Range   WBC 9.8 3.6 - 11.0 K/uL   RBC 4.28 3.80 - 5.20 MIL/uL   Hemoglobin 12.4 12.0 - 16.0 g/dL   HCT 36.0 35.0 - 47.0 %   MCV 84.3 80.0 - 100.0 fL   MCH 28.9 26.0 - 34.0 pg   MCHC 34.3 32.0 - 36.0 g/dL   RDW 14.0 11.5 - 14.5 %   Platelets 229  150 - 440 K/uL   Neutrophils Relative % 55 %   Neutro Abs 5.3 1.4 - 6.5 K/uL   Lymphocytes Relative 37 %   Lymphs Abs 3.6 1.0 - 3.6 K/uL   Monocytes Relative 6 %   Monocytes Absolute 0.6 0.2 - 0.9 K/uL   Eosinophils Relative 1 %   Eosinophils Absolute 0.1 0 - 0.7 K/uL   Basophils Relative 1 %   Basophils Absolute 0.1 0 - 0.1 K/uL  Comprehensive metabolic panel     Status: Abnormal   Collection Time: 11/26/15 12:46 AM  Result Value Ref Range   Sodium 137 135 - 145 mmol/L   Potassium 3.6 3.5 - 5.1 mmol/L   Chloride 105 101 - 111 mmol/L   CO2 26 22 - 32 mmol/L   Glucose, Bld 152 (H) 65 - 99 mg/dL   BUN 16 6 - 20 mg/dL   Creatinine, Ser 0.76 0.44 - 1.00 mg/dL   Calcium 9.2 8.9 - 10.3 mg/dL   Total Protein 7.2 6.5 - 8.1 g/dL   Albumin 4.3 3.5 - 5.0 g/dL   AST 25 15 - 41 U/L   ALT 19 14 - 54 U/L   Alkaline Phosphatase 60 38 - 126 U/L   Total Bilirubin 0.4 0.3 - 1.2 mg/dL   GFR calc non Af Amer >60 >60 mL/min   GFR calc Af Amer >60 >60 mL/min   Anion gap 6 5 - 15  Ethanol     Status: None   Collection Time: 11/26/15 12:46 AM  Result Value Ref Range   Alcohol, Ethyl (B) <5 <5 mg/dL  Troponin I     Status: None   Collection Time: 11/26/15 12:46 AM  Result Value Ref Range   Troponin I <0.03 <0.03 ng/mL  TSH     Status: None   Collection Time: 11/26/15  5:16 AM  Result Value Ref Range   TSH 1.073 0.350 - 4.500 uIU/mL  Troponin I     Status: None   Collection Time: 11/26/15  5:16 AM  Result Value Ref Range   Troponin I <0.03 <0.03 ng/mL  Troponin I     Status: None   Collection Time: 11/26/15 10:47 AM  Result Value Ref Range   Troponin I <0.03 <0.03 ng/mL   Dg Chest Port 1 View  11/26/2015  CLINICAL DATA:  Near syncope while sitting on  a chair. EXAM: PORTABLE CHEST 1 VIEW COMPARISON:  Report from chest radiograph 07/12/2002, images not available. FINDINGS: The cardiomediastinal contours are normal. The lungs are clear. Pulmonary vasculature is normal. No consolidation, pleural  effusion, or pneumothorax. Broad-base dextroscoliosis of the thoracic spine. No acute osseous abnormalities are seen. IMPRESSION: No acute pulmonary process. Electronically Signed   By: Jeb Levering M.D.   On: 11/26/2015 00:42     ASSESSMENT AND PLAN:Syncopal episode with anginal type symptoms prior to passing out. She had a negative stress test at Dr. Rosario Jacks office 3 weeks ago. This stress test was nuclear stress test. Option of CTA coronaries in the office was given was versus cardiac catheterization and patient wants to have answered before being discharged. She agreed to have a cardiac catheterization which will be done today.  Alysabeth Scalia A

## 2015-11-26 NOTE — ED Provider Notes (Signed)
[facility] Emergency Department Provider Note   ____________________________________________  Time seen: Approximately 1:09 AM  I have reviewed the triage vital signs and the nursing notes.   HISTORY  Chief Complaint Near Syncope    HPI Jenny Lee is a 60 y.o. female who presents to the ED from home with a chief complaint of near syncope. Patient has a history of CAD s/p 4 MIs who was sitting in her chair and visiting with her sons when she became profusely diaphoretic, nauseated, slightly short of breath with a discomfort in her neck. Son states she passed out for approximately 30 seconds then recovered. Patient denies recent fever, chills, chest pain, abdominal pain, vomiting, diarrhea. Denies recent travel or trauma. Nothing made her symptoms better or worse.   Past Medical History  Diagnosis Date  . Heart attack ([place])     2001  . Hypertension   . Hypercholesteremia     Patient Active Problem List   Diagnosis Date Noted  . H/O acute myocardial infarction 08/15/2014  . Encounter for other preprocedural examination 08/15/2014  . Female genuine stress incontinence 08/15/2014  . 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 Mastalgia 10/06/2012    Past Surgical History  Procedure Laterality Date  . Abdominal hysterectomy  2004  . Neck surgery  1998    Current Outpatient Rx  Name  Route  Sig  Dispense  Refill  . aspirin 81 MG tablet   Oral   Take 81 mg by mouth daily.         Marland Kitchen estradiol (ESTRACE) 0.5 MG tablet   Oral   Take 0.5 mg by mouth daily.         . metoprolol tartrate (LOPRESSOR) 25 MG tablet   Oral   Take 25 mg by mouth daily.       4   . omega-3 acid ethyl esters (LOVAZA) 1 g capsule   Oral   Take 2 g by mouth daily at 3 pm.          . sertraline (ZOLOFT) 50 MG tablet   Oral   Take 50 mg by mouth at bedtime.          . simvastatin (ZOCOR) 20 MG tablet   Oral   Take 20 mg by mouth  daily at 6 PM.       4     Allergies Review of patient's allergies indicates no known allergies.  Family History  Problem Relation Age of Onset  . Breast cancer Sister 54    Social History Social History  Substance Use Topics  . Smoking status: Current Some Day Smoker -- 0.50 packs/day for 5 years  . Smokeless tobacco: Never Used  . Alcohol Use: No    Review of Systems  Constitutional: No fever/chills. Eyes: No visual changes. ENT: No sore throat. Cardiovascular: Denies chest pain. Respiratory: Denies shortness of breath. Gastrointestinal: No abdominal pain.  Positive for nausea, no vomiting.  No diarrhea.  No constipation. Genitourinary: Negative for dysuria. Musculoskeletal: Negative for back pain. Skin: Positive for diaphoresis. Negative for rash. Neurological: Positive for near-syncope. Negative for headaches, focal weakness or numbness.  10-point ROS otherwise negative.  ____________________________________________   PHYSICAL EXAM:  VITAL SIGNS: ED Triage Vitals  Enc Vitals Group     BP 11/26/15 0013 121/63 mmHg     Pulse Rate 11/26/15 0013 70     Resp 11/26/15 0013 16     Temp 11/26/15 0013 97.6 F (36.4  C)     Temp Source 11/26/15 0013 Oral     SpO2 11/26/15 0004 97 %     Weight --      Height --      Head Cir --      Peak Flow --      Pain Score 11/26/15 0014 0     Pain Loc --      Pain Edu? --      Excl. in [place]? --     Constitutional: Alert and oriented. Tired appearing and in no acute distress. Eyes: Conjunctivae are normal. PERRL. EOMI. Head: Atraumatic. Nose: No congestion/rhinnorhea. Mouth/Throat: Mucous membranes are moist.  Oropharynx non-erythematous. Neck: No stridor.  No carotid bruits. Cardiovascular: Normal rate, regular rhythm. Grossly normal heart sounds.  Good peripheral circulation. Respiratory: Normal respiratory effort.  No retractions. Lungs CTAB. Gastrointestinal: Soft and nontender. No distention. No abdominal bruits.  No CVA tenderness. Musculoskeletal: No lower extremity tenderness nor edema.  No joint effusions. Neurologic:  Normal speech and language. No gross focal neurologic deficits are appreciated.  Skin:  Skin is warm, dry and intact. No rash noted. Psychiatric: Mood and affect are normal. Speech and behavior are normal.  ____________________________________________   LABS (all labs ordered are listed, but only abnormal results are displayed)  Labs Reviewed  COMPREHENSIVE METABOLIC PANEL - Abnormal; Notable for the following:    Glucose, Bld 152 (*)    All other components within normal limits  CBC WITH DIFFERENTIAL/PLATELET  ETHANOL  TROPONIN I   ____________________________________________  EKG  ED ECG REPORT I, Amaris Garrette J, the attending physician, personally viewed and interpreted this ECG.   Date: 11/26/2015  EKG Time: 0129  Rate: 63  Rhythm: normal EKG, normal sinus rhythm  Axis: Normal  Intervals:none  ST&T Change: Nonspecific  ____________________________________________  RADIOLOGY  Chest x-ray (viewed by me, interpreted per Dr. Marisue Humble): No acute pulmonary process. ____________________________________________   PROCEDURES  Procedure(s) performed: None  Critical Care performed: No  ____________________________________________   INITIAL IMPRESSION / ASSESSMENT AND PLAN / ED COURSE  Pertinent labs & imaging results that were available during my care of the patient were reviewed by me and considered in my medical decision making (see chart for details).  60 year old female with a significant history of CAD who presents with near syncopal episode associated with profuse diaphoresis, nausea and mild shortness of breath. Will obtain screening lab work, chest x-ray and orthostatics. Currently patient reports symptoms have improved.  ----------------------------------------- 1:44 AM on 11/26/2015 -----------------------------------------  Orthostatics normal.  Aspirin administered. Discussed with hospitalist to evaluate patient in the emergency department for admission. ____________________________________________   FINAL CLINICAL IMPRESSION(S) / ED DIAGNOSES  Final diagnoses:  Near syncope  Coronary artery disease involving native coronary artery of native heart with angina pectoris ([place])      NEW MEDICATIONS STARTED DURING THIS VISIT:  New Prescriptions   No medications on file     Note:  This document was prepared using Dragon voice recognition software and may include unintentional dictation errors.    Paulette Blanch, MD 11/26/15 [phone]

## 2015-11-26 NOTE — H&P (Signed)
Jenny Lee is an 60 y.o. female.   Chief Complaint: Fainting HPI: The patient with past medical history of coronary artery disease presents to the emergency department after a fainting episode. She states that she recalls feeling nauseous and diaphoretic while sitting. She stood to walk upstairs and gradually felt herself becoming more lightheaded. Her husband reports that he caught her before she fell. The patient did not hit her head. Loss of consciousness lasted approximately 30 seconds to 1 minute. The patient denies chest pain, shortness of breath, vomiting or diarrhea. She admits to feeling pressure in her throat which has eased to some degree but is still present. The patient reports that she had chest pain over her left chest when she has had heart attacks in the past. She denies any symptoms similar to her previous acute coronary events. Due to her cardiovascular risk the emergency department staff called the hospitalist service for admission.  Past Medical History  Diagnosis Date  . Heart attack ([place])     2001  . Hypertension   . Hypercholesteremia     Past Surgical History  Procedure Laterality Date  . Abdominal hysterectomy  2004  . Neck surgery  1998    Family History  Problem Relation Age of Onset  . Breast cancer Sister 103   Social History:  reports that she has been smoking.  She has never used smokeless tobacco. She reports that she does not drink alcohol or use illicit drugs.  Allergies: No Known Allergies  Medications Prior to Admission  Medication Sig Dispense Refill  . aspirin 81 MG tablet Take 81 mg by mouth daily.    Marland Kitchen estradiol (ESTRACE) 0.5 MG tablet Take 0.5 mg by mouth daily.    . metoprolol tartrate (LOPRESSOR) 25 MG tablet Take 25 mg by mouth daily.   4  . omega-3 acid ethyl esters (LOVAZA) 1 g capsule Take 2 g by mouth daily at 3 pm.     . sertraline (ZOLOFT) 50 MG tablet Take 50 mg by mouth at bedtime.     . simvastatin (ZOCOR) 20 MG tablet Take  20 mg by mouth daily at 6 PM.   4    Results for orders placed or performed during the hospital encounter of 11/25/15 (from the past 48 hour(s))  CBC with Differential     Status: None   Collection Time: 11/26/15 12:46 AM  Result Value Ref Range   WBC 9.8 3.6 - 11.0 K/uL   RBC 4.28 3.80 - 5.20 MIL/uL   Hemoglobin 12.4 12.0 - 16.0 g/dL   HCT 36.0 35.0 - 47.0 %   MCV 84.3 80.0 - 100.0 fL   MCH 28.9 26.0 - 34.0 pg   MCHC 34.3 32.0 - 36.0 g/dL   RDW 14.0 11.5 - 14.5 %   Platelets 229 150 - 440 K/uL   Neutrophils Relative % 55 %   Neutro Abs 5.3 1.4 - 6.5 K/uL   Lymphocytes Relative 37 %   Lymphs Abs 3.6 1.0 - 3.6 K/uL   Monocytes Relative 6 %   Monocytes Absolute 0.6 0.2 - 0.9 K/uL   Eosinophils Relative 1 %   Eosinophils Absolute 0.1 0 - 0.7 K/uL   Basophils Relative 1 %   Basophils Absolute 0.1 0 - 0.1 K/uL  Comprehensive metabolic panel     Status: Abnormal   Collection Time: 11/26/15 12:46 AM  Result Value Ref Range   Sodium 137 135 - 145 mmol/L   Potassium 3.6 3.5 - 5.1  mmol/L   Chloride 105 101 - 111 mmol/L   CO2 26 22 - 32 mmol/L   Glucose, Bld 152 (H) 65 - 99 mg/dL   BUN 16 6 - 20 mg/dL   Creatinine, Ser 0.76 0.44 - 1.00 mg/dL   Calcium 9.2 8.9 - 10.3 mg/dL   Total Protein 7.2 6.5 - 8.1 g/dL   Albumin 4.3 3.5 - 5.0 g/dL   AST 25 15 - 41 U/L   ALT 19 14 - 54 U/L   Alkaline Phosphatase 60 38 - 126 U/L   Total Bilirubin 0.4 0.3 - 1.2 mg/dL   GFR calc non Af Amer >60 >60 mL/min   GFR calc Af Amer >60 >60 mL/min    Comment: (NOTE) The eGFR has been calculated using the CKD EPI equation. This calculation has not been validated in all clinical situations. eGFR's persistently <60 mL/min signify possible Chronic Kidney Disease.    Anion gap 6 5 - 15  Ethanol     Status: None   Collection Time: 11/26/15 12:46 AM  Result Value Ref Range   Alcohol, Ethyl (B) <5 <5 mg/dL    Comment:        LOWEST DETECTABLE LIMIT FOR SERUM ALCOHOL IS 5 mg/dL FOR MEDICAL PURPOSES  ONLY   Troponin I     Status: None   Collection Time: 11/26/15 12:46 AM  Result Value Ref Range   Troponin I <0.03 <0.03 ng/mL   Dg Chest Port 1 View  11/26/2015  CLINICAL DATA:  Near syncope while sitting on a chair. EXAM: PORTABLE CHEST 1 VIEW COMPARISON:  Report from chest radiograph 07/12/2002, images not available. FINDINGS: The cardiomediastinal contours are normal. The lungs are clear. Pulmonary vasculature is normal. No consolidation, pleural effusion, or pneumothorax. Broad-base dextroscoliosis of the thoracic spine. No acute osseous abnormalities are seen. IMPRESSION: No acute pulmonary process. Electronically Signed   By: Jeb Levering M.D.   On: 11/26/2015 00:42    Review of Systems  Constitutional: Positive for diaphoresis. Negative for fever and chills.  HENT: Positive for sore throat (pressure). Negative for tinnitus.   Eyes: Negative for blurred vision and redness.  Respiratory: Negative for cough and shortness of breath.   Cardiovascular: Negative for chest pain, palpitations, orthopnea and PND.  Gastrointestinal: Positive for nausea. Negative for vomiting, abdominal pain and diarrhea.  Genitourinary: Negative for dysuria, urgency and frequency.  Musculoskeletal: Negative for myalgias and joint pain.  Skin: Negative for rash.       No lesions  Neurological: Positive for loss of consciousness. Negative for speech change, focal weakness and weakness.  Endo/Heme/Allergies: Does not bruise/bleed easily.       No temperature intolerance  Psychiatric/Behavioral: Negative for depression and suicidal ideas.    Blood pressure 117/69, pulse 59, temperature 97.9 F (36.6 C), temperature source Oral, resp. rate 18, height _0  (1.626 m), weight 69.945 kg (154 lb 3.2 oz), SpO2 97 %. Physical Exam  Vitals reviewed. Constitutional: She is oriented to person, place, and time. She appears well-developed and well-nourished. No distress.  HENT:  Head: Normocephalic and atraumatic.   Mouth/Throat: Oropharynx is clear and moist.  Eyes: Conjunctivae and EOM are normal. Pupils are equal, round, and reactive to light. No scleral icterus.  Neck: Normal range of motion. Neck supple. No JVD present. No tracheal deviation present. No thyromegaly present.  Cardiovascular: Normal rate, regular rhythm and normal heart sounds.  Exam reveals no gallop and no friction rub.   No murmur heard. Respiratory: Effort normal  and breath sounds normal.  GI: Soft. Bowel sounds are normal. She exhibits no distension. There is no tenderness.  Genitourinary:  Deferred  Musculoskeletal: Normal range of motion. She exhibits no edema.  Lymphadenopathy:    She has no cervical adenopathy.  Neurological: She is alert and oriented to person, place, and time. No cranial nerve deficit. She exhibits normal muscle tone.  Skin: Skin is warm and dry. No rash noted. No erythema.  Psychiatric: She has a normal mood and affect. Her behavior is normal. Judgment and thought content normal.     Assessment/Plan As is a 60 year old female admitted for syncope. 1. Syncope: Differential diagnosis includes vasovagal episode versus arrhythmia. Continue to monitor telemetry for the latter. The patient is well hydrated. May stand with assistance and fall precautions. 2. Coronary artery disease: Stable; no chest pain. Troponin is negative and EKG shows no signs of ischemia. Patient reports that she recently underwent an exercise stress test which was normal. Cardiology has been consulted for possible Holter monitor placement. Continue aspirin 3. Essential hypertension: Controlled; continue metoprolol 4. Hyperlipidemia: Continue statin therapy 5. Depression: Continue Zoloft 6. DVT prophylaxis: Lovenox 7. GI prophylaxis: None The patient is a full code. Time spent on admission orders and patient care approximately 45 minutes  Harrie Foreman, MD 11/26/2015, 5:50 AM

## 2015-11-26 NOTE — ED Notes (Signed)
Pt transported to room 258 

## 2015-11-26 NOTE — Care Management (Signed)
Faxed Life Vest order form to [employer] ( phone # [phone]/fax # [phone]). They will begin approval process and reach back out to [facility] when approval received. Representative stated [employer] should have an answer today.

## 2015-11-26 NOTE — Progress Notes (Signed)
Life vest needed before pt can be discharged per Dr. Humphrey Rolls Care Mag, Lattie Haw aware/ orders to keep pt. In hospital until life vest is applied.

## 2015-11-27 LAB — CBC
HCT: 39 % (ref 35.0–47.0)
Hemoglobin: 13.4 g/dL (ref 12.0–16.0)
MCH: 29.3 pg (ref 26.0–34.0)
MCHC: 34.4 g/dL (ref 32.0–36.0)
MCV: 85.2 fL (ref 80.0–100.0)
PLATELETS: 224 10*3/uL (ref 150–440)
RBC: 4.57 MIL/uL (ref 3.80–5.20)
RDW: 14.2 % (ref 11.5–14.5)
WBC: 10 10*3/uL (ref 3.6–11.0)

## 2015-11-27 NOTE — Progress Notes (Signed)
SUBJECTIVE: No chest pain or shortness of breath   Filed Vitals:   11/26/15 2038 11/27/15 0533 11/27/15 0912 11/27/15 0915  BP: 113/92 118/68 120/43   Pulse: 64 63 58 64  Temp: 98.2 F (36.8 C) 97.7 F (36.5 C)    TempSrc: Oral Oral    Resp:      Height:      Weight:  153 lb 14.4 oz (69.809 kg)    SpO2:  98%      Intake/Output Summary (Last 24 hours) at 11/27/15 1050 Last data filed at 11/27/15 0938  Gross per 24 hour  Intake    240 ml  Output      0 ml  Net    240 ml    LABS: Basic Metabolic Panel:  Recent Labs  11/26/15 0046  NA 137  K 3.6  CL 105  CO2 26  GLUCOSE 152*  BUN 16  CREATININE 0.76  CALCIUM 9.2   Liver Function Tests:  Recent Labs  11/26/15 0046  AST 25  ALT 19  ALKPHOS 60  BILITOT 0.4  PROT 7.2  ALBUMIN 4.3   No results for input(s): LIPASE, AMYLASE in the last 72 hours. CBC:  Recent Labs  11/26/15 0046 11/27/15 0800  WBC 9.8 10.0  NEUTROABS 5.3  --   HGB 12.4 13.4  HCT 36.0 39.0  MCV 84.3 85.2  PLT 229 224   Cardiac Enzymes:  Recent Labs  11/26/15 0516 11/26/15 1047 11/26/15 1917  TROPONINI <0.03 <0.03 <0.03   BNP: Invalid input(s): POCBNP D-Dimer: No results for input(s): DDIMER in the last 72 hours. Hemoglobin A1C:  Recent Labs  11/26/15 0516  HGBA1C 6.2*   Fasting Lipid Panel: No results for input(s): CHOL, HDL, LDLCALC, TRIG, CHOLHDL, LDLDIRECT in the last 72 hours. Thyroid Function Tests:  Recent Labs  11/26/15 0516  TSH 1.073   Anemia Panel: No results for input(s): VITAMINB12, FOLATE, FERRITIN, TIBC, IRON, RETICCTPCT in the last 72 hours.   PHYSICAL EXAM General: Well developed, well nourished, in no acute distress HEENT:  Normocephalic and atramatic Neck:  No JVD.  Lungs: Clear bilaterally to auscultation and percussion. Heart: HRRR . Normal S1 and S2 without gallops or murmurs.  Abdomen: Bowel sounds are positive, abdomen soft and non-tender  Msk:  Back normal, normal gait. Normal  strength and tone for age. Extremities: No clubbing, cyanosis or edema.   Neuro: Alert and oriented X 3. Psych:  Good affect, responds appropriately  TELEMETRY:Sinus rhythm  ASSESSMENT AND PLAN:takasubo cardiomyopathy with anteroapical aneurysm and normal coronaries and left ventricular ejection fraction 35-40%. Advise lifevest and we'll start Entresto and Corag in the office as blood pressure is low right now. Patient will be getting lifevest and can be discharged with follow-up in the office 10:00 next Tuesday.  Active Problems:   Syncope    Tova Vater A, MD, [facility] 11/27/2015 10:50 AM

## 2015-11-27 NOTE — Care Management (Signed)
[employer] representative in with patient.

## 2015-11-27 NOTE — Progress Notes (Signed)
Patient received discharge instructions, pt verbalized understanding. IV was removed with no signs of infection. Dressing clean, dry intact. No skin tears or wounds present. Life vest has been applied and set up. Follow up appointments have been made. Patient was escorted out with staff member via wheelchair via private auto. No further needs from care management team.

## 2015-11-27 NOTE — Care Management (Addendum)
Spoke with [employer] who states they never received the fax even though RNCM received a confirmation. Refaxed to [phone] and [phone]. Dr. Anselm Jungling states patient will not be discharged on Entresto at this time due to BP. Dr. Humphrey Rolls will have patient follow up in the office.

## 2015-11-27 NOTE — Care Management (Addendum)
Patient updated on Life Vest status. Verified that fax has been received. Insurance approval received. Representative in route for vest fit. No other CM needs. Primary nurse and patient updated

## 2015-11-28 ENCOUNTER — Encounter: Payer: Self-pay | Admitting: Cardiovascular Disease

## 2015-12-04 DIAGNOSIS — I219 Acute myocardial infarction, unspecified: Secondary | ICD-10-CM | POA: Insufficient documentation

## 2015-12-04 HISTORY — DX: Acute myocardial infarction, unspecified: I21.9

## 2015-12-07 ENCOUNTER — Encounter: Payer: Self-pay | Admitting: Podiatry

## 2015-12-07 ENCOUNTER — Ambulatory Visit (INDEPENDENT_AMBULATORY_CARE_PROVIDER_SITE_OTHER): Payer: BLUE CROSS/BLUE SHIELD | Admitting: Sports Medicine

## 2015-12-07 DIAGNOSIS — M779 Enthesopathy, unspecified: Secondary | ICD-10-CM

## 2015-12-07 DIAGNOSIS — M2040 Other hammer toe(s) (acquired), unspecified foot: Secondary | ICD-10-CM

## 2015-12-07 DIAGNOSIS — M898X9 Other specified disorders of bone, unspecified site: Secondary | ICD-10-CM

## 2015-12-07 DIAGNOSIS — M2010 Hallux valgus (acquired), unspecified foot: Secondary | ICD-10-CM

## 2015-12-07 NOTE — Patient Instructions (Signed)

## 2015-12-07 NOTE — Progress Notes (Signed)
Patient ID: Jenny Lee, female   DOB: 24-Oct-1955, 60 y.o.   MRN: [id number] Patient discussed with medical assistant. One pair of Custom Functional orthotics were fitted and dispensed to patient with wear instructions and break in period explained. Patient to follow up as scheduled for continued care or sooner if problems or issues arise. -Dr. Cannon Kettle

## 2015-12-11 DIAGNOSIS — Z9889 Other specified postprocedural states: Secondary | ICD-10-CM

## 2015-12-11 DIAGNOSIS — I10 Essential (primary) hypertension: Secondary | ICD-10-CM | POA: Insufficient documentation

## 2015-12-11 HISTORY — DX: Other specified postprocedural states: Z98.890

## 2015-12-20 ENCOUNTER — Emergency Department: Payer: BLUE CROSS/BLUE SHIELD

## 2015-12-20 ENCOUNTER — Observation Stay
Admission: EM | Admit: 2015-12-20 | Discharge: 2015-12-22 | Disposition: A | Discharge status: Home / Self Care | Payer: BLUE CROSS/BLUE SHIELD | Attending: Internal Medicine | Admitting: Internal Medicine

## 2015-12-20 DIAGNOSIS — F17200 Nicotine dependence, unspecified, uncomplicated: Secondary | ICD-10-CM | POA: Insufficient documentation

## 2015-12-20 DIAGNOSIS — Z79899 Other long term (current) drug therapy: Secondary | ICD-10-CM | POA: Diagnosis not present

## 2015-12-20 DIAGNOSIS — K219 Gastro-esophageal reflux disease without esophagitis: Secondary | ICD-10-CM | POA: Diagnosis not present

## 2015-12-20 DIAGNOSIS — R000 Tachycardia, unspecified: Secondary | ICD-10-CM

## 2015-12-20 DIAGNOSIS — I252 Old myocardial infarction: Secondary | ICD-10-CM | POA: Diagnosis not present

## 2015-12-20 DIAGNOSIS — E7800 Pure hypercholesterolemia, unspecified: Secondary | ICD-10-CM | POA: Insufficient documentation

## 2015-12-20 DIAGNOSIS — Z7982 Long term (current) use of aspirin: Secondary | ICD-10-CM | POA: Insufficient documentation

## 2015-12-20 DIAGNOSIS — Z9049 Acquired absence of other specified parts of digestive tract: Secondary | ICD-10-CM | POA: Diagnosis not present

## 2015-12-20 DIAGNOSIS — I5181 Takotsubo syndrome: Secondary | ICD-10-CM | POA: Diagnosis present

## 2015-12-20 DIAGNOSIS — E785 Hyperlipidemia, unspecified: Secondary | ICD-10-CM | POA: Diagnosis present

## 2015-12-20 DIAGNOSIS — Z803 Family history of malignant neoplasm of breast: Secondary | ICD-10-CM | POA: Diagnosis not present

## 2015-12-20 DIAGNOSIS — I10 Essential (primary) hypertension: Secondary | ICD-10-CM | POA: Diagnosis not present

## 2015-12-20 DIAGNOSIS — F419 Anxiety disorder, unspecified: Secondary | ICD-10-CM | POA: Diagnosis not present

## 2015-12-20 DIAGNOSIS — K8020 Calculus of gallbladder without cholecystitis without obstruction: Secondary | ICD-10-CM | POA: Insufficient documentation

## 2015-12-20 DIAGNOSIS — Z9889 Other specified postprocedural states: Secondary | ICD-10-CM | POA: Insufficient documentation

## 2015-12-20 DIAGNOSIS — I2510 Atherosclerotic heart disease of native coronary artery without angina pectoris: Secondary | ICD-10-CM | POA: Diagnosis not present

## 2015-12-20 DIAGNOSIS — I471 Supraventricular tachycardia: Principal | ICD-10-CM | POA: Insufficient documentation

## 2015-12-20 HISTORY — DX: Essential (primary) hypertension: I10

## 2015-12-20 HISTORY — DX: Gastro-esophageal reflux disease without esophagitis: K21.9

## 2015-12-20 HISTORY — DX: Atherosclerotic heart disease of native coronary artery without angina pectoris: I25.10

## 2015-12-20 HISTORY — DX: Hyperlipidemia, unspecified: E78.5

## 2015-12-20 HISTORY — DX: Takotsubo syndrome: I51.81

## 2015-12-20 HISTORY — DX: Tachycardia, unspecified: R00.0

## 2015-12-20 LAB — CBC WITH DIFFERENTIAL/PLATELET
Basophils Absolute: 0.1 10*3/uL (ref 0–0.1)
Basophils Relative: 1 %
EOS ABS: 0 10*3/uL (ref 0–0.7)
EOS PCT: 1 %
HEMATOCRIT: 38.9 % (ref 35.0–47.0)
HEMOGLOBIN: 13.1 g/dL (ref 12.0–16.0)
LYMPHS ABS: 3.1 10*3/uL (ref 1.0–3.6)
LYMPHS PCT: 34 %
MCH: 28.7 pg (ref 26.0–34.0)
MCHC: 33.7 g/dL (ref 32.0–36.0)
MCV: 85.1 fL (ref 80.0–100.0)
MONO ABS: 0.4 10*3/uL (ref 0.2–0.9)
MONOS PCT: 5 %
Neutro Abs: 5.5 10*3/uL (ref 1.4–6.5)
Neutrophils Relative %: 59 %
Platelets: 235 10*3/uL (ref 150–440)
RBC: 4.57 MIL/uL (ref 3.80–5.20)
RDW: 14.1 % (ref 11.5–14.5)
WBC: 9.1 10*3/uL (ref 3.6–11.0)

## 2015-12-20 LAB — COMPREHENSIVE METABOLIC PANEL
ALBUMIN: 4.2 g/dL (ref 3.5–5.0)
ALK PHOS: 68 U/L (ref 38–126)
ALT: 19 U/L (ref 14–54)
ANION GAP: 10 (ref 5–15)
AST: 27 U/L (ref 15–41)
BILIRUBIN TOTAL: 0.6 mg/dL (ref 0.3–1.2)
BUN: 16 mg/dL (ref 6–20)
CALCIUM: 9.1 mg/dL (ref 8.9–10.3)
CO2: 24 mmol/L (ref 22–32)
CREATININE: 0.88 mg/dL (ref 0.44–1.00)
Chloride: 101 mmol/L (ref 101–111)
GFR calc Af Amer: >60 mL/min (ref >60)
GFR calc non Af Amer: >60 mL/min (ref >60)
GLUCOSE: 177 mg/dL — AB (ref 65–99)
Potassium: 3.9 mmol/L (ref 3.5–5.1)
Sodium: 135 mmol/L (ref 135–145)
TOTAL PROTEIN: 7.1 g/dL (ref 6.5–8.1)

## 2015-12-20 LAB — LIPASE, BLOOD: Lipase: 26 U/L (ref 11–51)

## 2015-12-20 LAB — BRAIN NATRIURETIC PEPTIDE: B NATRIURETIC PEPTIDE 5: 41 pg/mL (ref 0.0–100.0)

## 2015-12-20 LAB — TROPONIN I: Troponin I: <0.03 ng/mL (ref <0.03)

## 2015-12-20 MED ORDER — ONDANSETRON HCL 4 MG/2ML IJ SOLN
4.0000 mg | Freq: Once | INTRAMUSCULAR | Status: AC
  Administered 2015-12-20: 4 mg via INTRAVENOUS
  Filled 2015-12-20: qty 2

## 2015-12-20 MED ORDER — ACETAMINOPHEN 325 MG PO TABS
650.0000 mg | ORAL_TABLET | Freq: Four times a day (QID) | ORAL | Status: DC | PRN
  Administered 2015-12-21 – 2015-12-22 (×2): 650 mg via ORAL
  Filled 2015-12-20 (×2): qty 2

## 2015-12-20 MED ORDER — SERTRALINE HCL 50 MG PO TABS
50.0000 mg | ORAL_TABLET | Freq: Every day | ORAL | Status: DC
  Administered 2015-12-20 – 2015-12-21 (×2): 50 mg via ORAL
  Filled 2015-12-20 (×2): qty 1

## 2015-12-20 MED ORDER — ENOXAPARIN SODIUM 40 MG/0.4ML ~~LOC~~ SOLN
40.0000 mg | SUBCUTANEOUS | Status: DC
  Administered 2015-12-20 – 2015-12-21 (×2): 40 mg via SUBCUTANEOUS
  Filled 2015-12-20 (×2): qty 0.4

## 2015-12-20 MED ORDER — SODIUM CHLORIDE 0.9% FLUSH
3.0000 mL | Freq: Two times a day (BID) | INTRAVENOUS | Status: DC
  Administered 2015-12-20 – 2015-12-21 (×3): 3 mL via INTRAVENOUS

## 2015-12-20 MED ORDER — SODIUM CHLORIDE 0.9 % IV BOLUS (SEPSIS): 1000.0000 mL | Freq: Once | INTRAVENOUS | Status: DC

## 2015-12-20 MED ORDER — ONDANSETRON HCL 4 MG/2ML IJ SOLN
4.0000 mg | Freq: Four times a day (QID) | INTRAMUSCULAR | Status: DC | PRN
Start: 2015-12-20 — End: 2015-12-22

## 2015-12-20 MED ORDER — IOPAMIDOL (ISOVUE-370) INJECTION 76%
100.0000 mL | Freq: Once | INTRAVENOUS | Status: AC | PRN
Start: 2015-12-20 — End: 2015-12-20
  Administered 2015-12-20: 75 mL via INTRAVENOUS

## 2015-12-20 MED ORDER — CARVEDILOL 6.25 MG PO TABS
ORAL_TABLET | ORAL | Status: AC
  Administered 2015-12-20: 12.5 mg via ORAL
  Filled 2015-12-20: qty 2

## 2015-12-20 MED ORDER — LORAZEPAM 0.5 MG PO TABS
0.5000 mg | ORAL_TABLET | Freq: Four times a day (QID) | ORAL | Status: DC | PRN
  Administered 2015-12-20: 0.5 mg via ORAL
  Filled 2015-12-20: qty 1

## 2015-12-20 MED ORDER — ACETAMINOPHEN 650 MG RE SUPP: 650.0000 mg | Freq: Four times a day (QID) | RECTAL | Status: DC | PRN

## 2015-12-20 MED ORDER — CARVEDILOL 12.5 MG PO TABS
12.5000 mg | ORAL_TABLET | Freq: Two times a day (BID) | ORAL | Status: DC
  Administered 2015-12-20: 12.5 mg via ORAL

## 2015-12-20 MED ORDER — PANTOPRAZOLE SODIUM 40 MG PO TBEC
40.0000 mg | DELAYED_RELEASE_TABLET | Freq: Every day | ORAL | Status: DC
  Administered 2015-12-21 – 2015-12-22 (×2): 40 mg via ORAL
  Filled 2015-12-20 (×2): qty 1

## 2015-12-20 MED ORDER — SIMVASTATIN 20 MG PO TABS
20.0000 mg | ORAL_TABLET | Freq: Every day | ORAL | Status: DC
  Administered 2015-12-21: 20 mg via ORAL
  Filled 2015-12-20: qty 1

## 2015-12-20 MED ORDER — LORAZEPAM 2 MG/ML IJ SOLN: 1.0000 mg | Freq: Once | INTRAMUSCULAR | Status: DC

## 2015-12-20 MED ORDER — ONDANSETRON HCL 4 MG PO TABS: 4.0000 mg | ORAL_TABLET | Freq: Four times a day (QID) | ORAL | Status: DC | PRN

## 2015-12-20 MED ORDER — ASPIRIN EC 81 MG PO TBEC
81.0000 mg | DELAYED_RELEASE_TABLET | Freq: Every day | ORAL | Status: DC
  Administered 2015-12-21 – 2015-12-22 (×2): 81 mg via ORAL
  Filled 2015-12-20 (×2): qty 1

## 2015-12-20 NOTE — ED Notes (Signed)
Pt up to bathroom and back in bed.  No shortness of breath noted.

## 2015-12-20 NOTE — ED Triage Notes (Signed)
Pt from work via EMS, report she was eating when she began to feel nauseated and fatigued and felt heart beating fast. 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 Pt wearing heart vest due to similar complaints on 7/4

## 2015-12-20 NOTE — ED Provider Notes (Signed)
[facility] Provider Note   CSN: [id number] Arrival date & time: 12/20/15  1548  First Provider Contact:  First MD Initiated Contact with Patient 12/20/15 1629        History   Chief Complaint Chief Complaint  Patient presents with  . Weakness  . Nausea    HPI Jenny Lee is a 60 y.o. female.  The history is provided by the patient.  Jenny Lee is a 60 y.o. female hx of MI, HL, HTN, takasubo cardiomyopathy here with palpitations. Patient states that she has some intermittent palpitations since her discharge. She was admitted about a month ago and had a cath that showed EF 30% with no CAD. She was sent home with life vest. Has intermittent palpitations and shortness of breath since then. Saw cardiology yesterday and was thought to be doing well. She has sudden onset of worsening shortness of breath and palpitations while eating dinner tonight. Denies fever or cough. Denies leg swelling. Came by EMS, apparently was in SVT rate 160s. Never received a shock from the vest.    Past Medical History:  Diagnosis Date  . Heart attack ([place])    2001  . Hypercholesteremia   . Hypertension     Patient Active Problem List   Diagnosis Date Noted  . Syncope 11/26/2015  . H/O acute myocardial infarction 08/15/2014  . Encounter for other preprocedural examination 08/15/2014  . Female genuine stress incontinence 08/15/2014  . 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 Mastalgia 10/06/2012    Past Surgical History:  Procedure Laterality Date  . ABDOMINAL HYSTERECTOMY  2004  . CARDIAC CATHETERIZATION N/A 11/26/2015   Procedure: Left Heart Cath and Coronary Angiography;  Surgeon: Dionisio David, MD;  Location: [facility];  Service: Cardiovascular;  Laterality: N/A;  . NECK SURGERY  1998    OB History    Gravida Para Term Preterm AB Living   4 2     2 2    SAB TAB Ectopic Multiple Live Births   2              Obstetric Comments   1st  Menstrual Cycle:  14 1st Pregnancy: 19       Home Medications    Prior to Admission medications   Medication Sig Start Date End Date Taking? Authorizing Provider  aspirin 81 MG tablet Take 81 mg by mouth daily.   Yes Historical Provider, MD  carvedilol (COREG) 6.25 MG tablet Take 1 tablet by mouth. 12/04/15  Yes Historical Provider, MD  estradiol (ESTRACE) 0.5 MG tablet Take 0.5 mg by mouth daily.   Yes Historical Provider, MD  metoprolol tartrate (LOPRESSOR) 25 MG tablet Take 25 mg by mouth daily.  10/10/15  Yes Historical Provider, MD  omega-3 acid ethyl esters (LOVAZA) 1 g capsule Take 2 g by mouth daily at 3 pm.    Yes Historical Provider, MD  pantoprazole (PROTONIX) 40 MG tablet Take 1 tablet by mouth daily. 12/04/15  Yes Historical Provider, MD  sertraline (ZOLOFT) 50 MG tablet Take 50 mg by mouth at bedtime.    Yes Historical Provider, MD  simvastatin (ZOCOR) 20 MG tablet Take 20 mg by mouth daily at 6 PM.  10/10/15  Yes Historical Provider, MD    Family History Family History  Problem Relation Age of Onset  . Breast cancer Sister 19    Social History Social History  Substance Use Topics  . Smoking status: Current Some Day Smoker  Packs/day: 0.50    Years: 5.00  . Smokeless tobacco: Never Used  . Alcohol use No     Allergies   Review of patient's allergies indicates no known allergies.   Review of Systems Review of Systems  Cardiovascular: Positive for palpitations.  Neurological: Positive for weakness.  All other systems reviewed and are negative.    Physical Exam Updated Vital Signs BP (!) 162/105   Pulse 88   Resp 14   SpO2 96%   Physical Exam  Constitutional: She is oriented to person, place, and time.  uncomfortable  HENT:  Head: Normocephalic.  Eyes: EOM are normal. Pupils are equal, round, and reactive to light.  Neck: Normal range of motion.  Cardiovascular:  Tachycardic   Pulmonary/Chest: Effort normal and breath sounds normal.    Abdominal: Soft. Bowel sounds are normal.  Musculoskeletal: Normal range of motion.  Neurological: She is alert and oriented to person, place, and time.  Skin: Skin is warm.  Psychiatric: She has a normal mood and affect.  Nursing note and vitals reviewed.    ED Treatments / Results  Labs (all labs ordered are listed, but only abnormal results are displayed) Labs Reviewed  COMPREHENSIVE METABOLIC PANEL - Abnormal; Notable for the following:       Result Value   Glucose, Bld 177 (*)    All other components within normal limits  CBC WITH DIFFERENTIAL/PLATELET  TROPONIN I  LIPASE, BLOOD  BRAIN NATRIURETIC PEPTIDE    EKG  EKG Interpretation None      ED ECG REPORT I, YAO, DAVID, the attending physician, personally viewed and interpreted this ECG.   Date: 12/20/2015  EKG Time: 15:53 pm  Rate: 79  Rhythm: normal EKG, normal sinus rhythm  Axis: normal  Intervals:none  ST&T Change: nonspecific   ED ECG REPORT I, YAO, DAVID, the attending physician, personally viewed and interpreted this ECG.   Date: 12/20/2015  EKG Time: 16:35 pm  Rate: 133  Rhythm: sinus tachycardia  Axis: normal  Intervals:none  ST&T Change: nonspecific    Radiology Dg Chest 2 View  Result Date: 12/20/2015 CLINICAL DATA:  Nausea and fatigue with rapid heart rate. EXAM: CHEST  2 VIEW COMPARISON:  11/26/2015. FINDINGS: The lungs are clear wiithout focal pneumonia, edema, pneumothorax or pleural effusion. The cardiopericardial silhouette is within normal limits for size. The visualized bony structures of the thorax are intact. IMPRESSION: Stable.  No acute cardiopulmonary findings. Electronically Signed   By: Misty Stanley M.D.   On: 12/20/2015 17:11  Ct Angio Chest Pe W Or Wo Contrast  Result Date: 12/20/2015 CLINICAL DATA:  Shortness of breath and nausea EXAM: CT ANGIOGRAPHY CHEST WITH CONTRAST TECHNIQUE: Multidetector CT imaging of the chest was performed using the standard protocol during bolus  administration of intravenous contrast. Multiplanar CT image reconstructions and MIPs were obtained to evaluate the vascular anatomy. CONTRAST:  75 mL Isovue 370. COMPARISON:  None. FINDINGS: Mediastinum/Lymph Nodes: Thoracic inlet is within normal limits. No mediastinal or hilar adenopathy is noted. Cardiovascular: The thoracic aorta and its branches are within normal limits. The pulmonary artery demonstrates a normal branching pattern. No pulmonary emboli are seen. The cardiac structures are within normal limits. No significant coronary calcifications are noted. Lungs/Pleura: No pulmonary mass, infiltrate, or effusion. Upper abdomen: Small dependent gallstones are noted within the gallbladder. No other focal abnormality is noted in the upper abdomen. Musculoskeletal: No acute bony abnormality is noted. Review of the MIP images confirms the above findings. IMPRESSION: No evidence of  pulmonary emboli. Small gallstones without complicating factors. Electronically Signed   By: Inez Catalina M.D.   On: 12/20/2015 18:07   Procedures Procedures (including critical care time)  Medications Ordered in ED Medications  carvedilol (COREG) tablet 12.5 mg (12.5 mg Oral Given 12/20/15 2007)  ondansetron (ZOFRAN) injection 4 mg (4 mg Intravenous Given 12/20/15 1648)  iopamidol (ISOVUE-370) 76 % injection 100 mL (75 mLs Intravenous Contrast Given 12/20/15 1746)     Initial Impression / Assessment and Plan / ED Course  I have reviewed the triage vital signs and the nursing notes.  Pertinent labs & imaging results that were available during my care of the patient were reviewed by me and considered in my medical decision making (see chart for details).  Clinical Course    Jenny Lee is a 60 y.o. female here presenting with palpitations, shortness of breath. Concerned for worsening heart failure from takasubo's vs PE. Will get labs, CT angio. Will reassess.   8:12 PM Still intermittently tachy to 120s. CT  showed no PE. BNP nl. No overt heart failure. Called Dr. Clayborn Bigness, who will see patient as consult. Will admit for persistent tachycardia.     Final Clinical Impressions(s) / ED Diagnoses   Final diagnoses:  None    New Prescriptions New Prescriptions   No medications on file     Drenda Freeze, MD 12/20/15 2014

## 2015-12-20 NOTE — Progress Notes (Signed)
Notified physician about patient request to remove life vest, no new orders must remain on person.

## 2015-12-20 NOTE — H&P (Signed)
Jenny Lee NAME: Jenny Lee    MR#:  [id number]  DATE OF BIRTH:  02-28-1956  DATE OF ADMISSION:  12/20/2015  PRIMARY CARE PHYSICIAN: Casilda Carls, MD   REQUESTING/REFERRING PHYSICIAN: Darl Householder, MD  CHIEF COMPLAINT:   Chief Complaint  Patient presents with  . Weakness  . Nausea    HISTORY OF PRESENT ILLNESS:  Jenny Lee  is a 60 y.o. female who presents with Palpitations associated with shortness of breath. Patient has a history of CAD and prior MI and was treated with angioplasty without stenting when she was 60 years old. About one month ago she had a syncopal episode and had evaluation with subsequent cardiac catheter which showed no obstruction but did show reduced ejection fraction. She was diagnosed with Takotsubo cardiomyopathy. Since that time she has had several episodes of palpitations associated with shortness of breath and chest discomfort. Today the episode was prolonged and intense and so she came to the ED for evaluation. On interview the patient is very anxious and crying, she states that she is very nervous that this episode will happen again because it is extremely uncomfortable feeling. She initially had a heart rate in the 160s that is reported to be in a rhythm of SVT. EKG here shows sinus tachycardia. Cardiology was contacted by ED physician and they recommended admission and they will consult on the patient. Initial lab and imaging workup is negative. Hospitalists were called for admission.  PAST MEDICAL HISTORY:   Past Medical History:  Diagnosis Date  . GERD (gastroesophageal reflux disease)   . Heart attack ([place])    2001  . Hypercholesteremia   . Hypertension   . Takotsubo cardiomyopathy     PAST SURGICAL HISTORY:   Past Surgical History:  Procedure Laterality Date  . ABDOMINAL HYSTERECTOMY  2004  . CARDIAC CATHETERIZATION N/A 11/26/2015   Procedure: Left Heart Cath and Coronary  Angiography;  Surgeon: Dionisio Tramon Crescenzo, MD;  Location: [facility];  Service: Cardiovascular;  Laterality: N/A;  . NECK SURGERY  1998    SOCIAL HISTORY:   Social History  Substance Use Topics  . Smoking status: Current Some Day Smoker    Packs/day: 0.50    Years: 5.00  . Smokeless tobacco: Never Used  . Alcohol use No    FAMILY HISTORY:   Family History  Problem Relation Age of Onset  . Breast cancer Sister 30    DRUG ALLERGIES:  No Known Allergies  MEDICATIONS AT HOME:   Prior to Admission medications   Medication Sig Start Date End Date Taking? Authorizing Provider  aspirin 81 MG tablet Take 81 mg by mouth daily.   Yes Historical Provider, MD  carvedilol (COREG) 6.25 MG tablet Take 1 tablet by mouth. 12/04/15  Yes Historical Provider, MD  estradiol (ESTRACE) 0.5 MG tablet Take 0.5 mg by mouth daily.   Yes Historical Provider, MD  metoprolol tartrate (LOPRESSOR) 25 MG tablet Take 25 mg by mouth daily.  10/10/15  Yes Historical Provider, MD  omega-3 acid ethyl esters (LOVAZA) 1 g capsule Take 2 g by mouth daily at 3 pm.    Yes Historical Provider, MD  pantoprazole (PROTONIX) 40 MG tablet Take 1 tablet by mouth daily. 12/04/15  Yes Historical Provider, MD  sertraline (ZOLOFT) 50 MG tablet Take 50 mg by mouth at bedtime.    Yes Historical Provider, MD  simvastatin (ZOCOR) 20 MG tablet Take 20 mg by mouth daily at  6 PM.  10/10/15  Yes Historical Provider, MD    REVIEW OF SYSTEMS:  Review of Systems  Constitutional: Negative for chills, fever, malaise/fatigue and weight loss.  HENT: Negative for ear pain, hearing loss and tinnitus.   Eyes: Negative for blurred vision, double vision, pain and redness.  Respiratory: Positive for shortness of breath. Negative for cough and hemoptysis.   Cardiovascular: Positive for chest pain and palpitations. Negative for orthopnea and leg swelling.  Gastrointestinal: Negative for abdominal pain, constipation, diarrhea, nausea and  vomiting.  Genitourinary: Negative for dysuria, frequency and hematuria.  Musculoskeletal: Negative for back pain, joint pain and neck pain.  Skin:       No acne, rash, or lesions  Neurological: Negative for dizziness, tremors, focal weakness and weakness.  Endo/Heme/Allergies: Negative for polydipsia. Does not bruise/bleed easily.  Psychiatric/Behavioral: Negative for depression. The patient is not nervous/anxious and does not have insomnia.      VITAL SIGNS:   Vitals:   12/20/15 1630 12/20/15 1830 12/20/15 1900 12/20/15 2007  BP: (!) 118/103 (!) 141/74 (!) 146/81 (!) 162/105  Pulse: (!) 126 91 87 88  Resp: (!) 22 17 14    SpO2: 98% 97% 96%    Wt Readings from Last 3 Encounters:  11/27/15 69.8 kg (153 lb 14.4 oz)  10/05/12 71.2 kg (157 lb)    PHYSICAL EXAMINATION:  Physical Exam  Vitals reviewed. Constitutional: She is oriented to person, place, and time. She appears well-developed and well-nourished. She appears distressed (patient is very tearful and anxious).  HENT:  Head: Normocephalic and atraumatic.  Mouth/Throat: Oropharynx is clear and moist.  Eyes: Conjunctivae and EOM are normal. Pupils are equal, round, and reactive to light. No scleral icterus.  Neck: Normal range of motion. Neck supple. No JVD present. No thyromegaly present.  Cardiovascular: Regular rhythm and intact distal pulses.  Exam reveals no gallop and no friction rub.   No murmur heard. Intermittent low-level tachycardia  Respiratory: Effort normal and breath sounds normal. No respiratory distress. She has no wheezes. She has no rales.  GI: Soft. Bowel sounds are normal. She exhibits no distension. There is no tenderness.  Musculoskeletal: Normal range of motion. She exhibits no edema.  No arthritis, no gout  Lymphadenopathy:    She has no cervical adenopathy.  Neurological: She is alert and oriented to person, place, and time. No cranial nerve deficit.  No dysarthria, no aphasia  Skin: Skin is warm  and dry. No rash noted. No erythema.  Psychiatric:  Tearful and anxious, but demonstrates good judgment and insight.    LABORATORY PANEL:   CBC  Recent Labs Lab 12/20/15 1554  WBC 9.1  HGB 13.1  HCT 38.9  PLT 235   ------------------------------------------------------------------------------------------------------------------  Chemistries   Recent Labs Lab 12/20/15 1554  NA 135  K 3.9  CL 101  CO2 24  GLUCOSE 177*  BUN 16  CREATININE 0.88  CALCIUM 9.1  AST 27  ALT 19  ALKPHOS 68  BILITOT 0.6   ------------------------------------------------------------------------------------------------------------------  Cardiac Enzymes  Recent Labs Lab 12/20/15 1554  TROPONINI <0.03   ------------------------------------------------------------------------------------------------------------------  RADIOLOGY:  Dg Chest 2 View  Result Date: 12/20/2015 CLINICAL DATA:  Nausea and fatigue with rapid heart rate. EXAM: CHEST  2 VIEW COMPARISON:  11/26/2015. FINDINGS: The lungs are clear wiithout focal pneumonia, edema, pneumothorax or pleural effusion. The cardiopericardial silhouette is within normal limits for size. The visualized bony structures of the thorax are intact. IMPRESSION: Stable.  No acute cardiopulmonary findings. Electronically Signed  By: Misty Stanley M.D.   On: 12/20/2015 17:11  Ct Angio Chest Pe W Or Wo Contrast  Result Date: 12/20/2015 CLINICAL DATA:  Shortness of breath and nausea EXAM: CT ANGIOGRAPHY CHEST WITH CONTRAST TECHNIQUE: Multidetector CT imaging of the chest was performed using the standard protocol during bolus administration of intravenous contrast. Multiplanar CT image reconstructions and MIPs were obtained to evaluate the vascular anatomy. CONTRAST:  75 mL Isovue 370. COMPARISON:  None. FINDINGS: Mediastinum/Lymph Nodes: Thoracic inlet is within normal limits. No mediastinal or hilar adenopathy is noted. Cardiovascular: The thoracic aorta  and its branches are within normal limits. The pulmonary artery demonstrates a normal branching pattern. No pulmonary emboli are seen. The cardiac structures are within normal limits. No significant coronary calcifications are noted. Lungs/Pleura: No pulmonary mass, infiltrate, or effusion. Upper abdomen: Small dependent gallstones are noted within the gallbladder. No other focal abnormality is noted in the upper abdomen. Musculoskeletal: No acute bony abnormality is noted. Review of the MIP images confirms the above findings. IMPRESSION: No evidence of pulmonary emboli. Small gallstones without complicating factors. Electronically Signed   By: Inez Catalina M.D.   On: 12/20/2015 18:07   EKG:   Orders placed or performed during the hospital encounter of 12/20/15  . EKG 12-Lead  . EKG 12-Lead    IMPRESSION AND PLAN:  Principal Problem:   Tachycardia - initially felt to be SVT, she was not given any IV medication to break this rhythm. Nurse did instruct her to use vagal maneuvers. Since that time she has been in sinus tach. We'll monitor her tonight with telemetry and have cardiology see her in the morning. We'll order something when necessary for her anxiety. Active Problems:   Takotsubo cardiomyopathy - last reported EF was around 30%. Patient has LifeVest in place. Troponin and BNP were normal today.   HTN (hypertension) - currently at goal, continue home meds   HLD (hyperlipidemia) - continue home dose statin   GERD (gastroesophageal reflux disease) - continue home dose PPI  All the records are reviewed and case discussed with ED provider. Management plans discussed with the patient and/or family.  DVT PROPHYLAXIS: SubQ lovenox  GI PROPHYLAXIS: PPI  ADMISSION STATUS: Observation  CODE STATUS: Full Code Status History    Date Active Date Inactive Code Status Order ID Comments User Context   11/26/2015  4:54 AM 11/27/2015  4:21 PM Full Code [id number]  Harrie Foreman, MD Inpatient       TOTAL TIME TAKING CARE OF THIS PATIENT: 40 minutes.    Tania Steinhauser Hickman 12/20/2015, 8:37 PM  [facility]  Office  [phone]  CC: Primary care physician; Casilda Carls, MD

## 2015-12-20 NOTE — ED Notes (Signed)
Attempted to call report, nurse not available at this time 

## 2015-12-21 LAB — TSH: TSH: 0.607 u[IU]/mL (ref 0.350–4.500)

## 2015-12-21 LAB — CBC
HCT: 36.3 % (ref 35.0–47.0)
Hemoglobin: 12.2 g/dL (ref 12.0–16.0)
MCH: 28.8 pg (ref 26.0–34.0)
MCHC: 33.6 g/dL (ref 32.0–36.0)
MCV: 85.8 fL (ref 80.0–100.0)
PLATELETS: 222 10*3/uL (ref 150–440)
RBC: 4.22 MIL/uL (ref 3.80–5.20)
RDW: 14.1 % (ref 11.5–14.5)
WBC: 10 10*3/uL (ref 3.6–11.0)

## 2015-12-21 LAB — BASIC METABOLIC PANEL
Anion gap: 5 (ref 5–15)
BUN: 18 mg/dL (ref 6–20)
CO2: 29 mmol/L (ref 22–32)
CREATININE: 0.76 mg/dL (ref 0.44–1.00)
Calcium: 8.9 mg/dL (ref 8.9–10.3)
Chloride: 106 mmol/L (ref 101–111)
GFR calc Af Amer: >60 mL/min (ref >60)
Glucose, Bld: 88 mg/dL (ref 65–99)
Potassium: 4.2 mmol/L (ref 3.5–5.1)
SODIUM: 140 mmol/L (ref 135–145)

## 2015-12-21 MED ORDER — IBUPROFEN 400 MG PO TABS
400.0000 mg | ORAL_TABLET | Freq: Four times a day (QID) | ORAL | Status: DC | PRN
  Administered 2015-12-21: 400 mg via ORAL
  Filled 2015-12-21: qty 1

## 2015-12-21 MED ORDER — POLYETHYLENE GLYCOL 3350 17 G PO PACK
17.0000 g | PACK | Freq: Every day | ORAL | Status: DC
  Administered 2015-12-21 – 2015-12-22 (×2): 17 g via ORAL
  Filled 2015-12-21 (×2): qty 1

## 2015-12-21 MED ORDER — CARVEDILOL 6.25 MG PO TABS
6.2500 mg | ORAL_TABLET | Freq: Two times a day (BID) | ORAL | Status: DC
  Administered 2015-12-21 – 2015-12-22 (×2): 6.25 mg via ORAL
  Filled 2015-12-21 (×3): qty 1

## 2015-12-21 NOTE — Progress Notes (Signed)
Patient ID: Jenny Lee, female   DOB: December 16, 1955, 60 y.o.   MRN: [id number]  [facility] PROGRESS NOTE  Jenny Lee [id number] DOB: 1955-11-19 DOA: 12/20/2015 PCP: Casilda Carls, MD  HPI/Subjective: Patient is very nervous about something like this is going to happen again. She describes the feeling yesterday as weakness in her entire body and her legs. No energy. Nauseous. Her hand started shaking. She developed pressure in the chest and heart rate went fast. Right now she is nervous that something like this is going to happen again.  Objective: Vitals:   12/21/15 0851 12/21/15 1107  BP:  (!) 107/50  Pulse: (!) 58 63  Resp:  16  Temp:  98.2 F (36.8 C)    Filed Weights   12/20/15 2140 12/21/15 0446  Weight: 70.3 kg (155 lb) 69.8 kg (153 lb 12.8 oz)    ROS: Review of Systems  Constitutional: Negative for chills and fever.  Eyes: Negative for blurred vision.  Respiratory: Positive for shortness of breath. Negative for cough.   Cardiovascular: Positive for chest pain and palpitations.  Gastrointestinal: Positive for nausea. Negative for abdominal pain, constipation, diarrhea and vomiting.  Genitourinary: Negative for dysuria.  Musculoskeletal: Negative for joint pain.  Neurological: Positive for weakness. Negative for dizziness and headaches.   Exam: Physical Exam  Constitutional: She is oriented to person, place, and time.  HENT:  Nose: No mucosal edema.  Mouth/Throat: No oropharyngeal exudate or posterior oropharyngeal edema.  Eyes: Conjunctivae, EOM and lids are normal. Pupils are equal, round, and reactive to light.  Neck: No JVD present. Carotid bruit is not present. No edema present. No thyroid mass and no thyromegaly present.  Cardiovascular: S1 normal and S2 normal.  Exam reveals no gallop.   No murmur heard. Pulses:      Dorsalis pedis pulses are 2+ on the right side, and 2+ on the left side.  Respiratory: No respiratory distress. She has no  wheezes. She has no rhonchi. She has no rales.  GI: Soft. Bowel sounds are normal. There is no tenderness.  Musculoskeletal:       Right ankle: She exhibits no swelling.       Left ankle: She exhibits no swelling.  Lymphadenopathy:    She has no cervical adenopathy.  Neurological: She is alert and oriented to person, place, and time. No cranial nerve deficit.  Skin: Skin is warm. No rash noted. Nails show no clubbing.  Psychiatric: Her mood appears anxious.      Data Reviewed: Basic Metabolic Panel:  Recent Labs Lab 12/20/15 1554 12/21/15 0432  NA 135 140  K 3.9 4.2  CL 101 106  CO2 24 29  GLUCOSE 177* 88  BUN 16 18  CREATININE 0.88 0.76  CALCIUM 9.1 8.9   Liver Function Tests:  Recent Labs Lab 12/20/15 1554  AST 27  ALT 19  ALKPHOS 68  BILITOT 0.6  PROT 7.1  ALBUMIN 4.2    Recent Labs Lab 12/20/15 1554  LIPASE 26   CBC:  Recent Labs Lab 12/20/15 1554 12/21/15 0432  WBC 9.1 10.0  NEUTROABS 5.5  --   HGB 13.1 12.2  HCT 38.9 36.3  MCV 85.1 85.8  PLT 235 222   Cardiac Enzymes:  Recent Labs Lab 12/20/15 1554  TROPONINI <0.03   BNP (last 3 results)  Recent Labs  12/20/15 1554  BNP 41.0   Studies: Dg Chest 2 View  Result Date: 12/20/2015 CLINICAL DATA:  Nausea and fatigue with rapid heart  rate. EXAM: CHEST  2 VIEW COMPARISON:  11/26/2015. FINDINGS: The lungs are clear wiithout focal pneumonia, edema, pneumothorax or pleural effusion. The cardiopericardial silhouette is within normal limits for size. The visualized bony structures of the thorax are intact. IMPRESSION: Stable.  No acute cardiopulmonary findings. Electronically Signed   By: Misty Stanley M.D.   On: 12/20/2015 17:11  Ct Angio Chest Pe W Or Wo Contrast  Result Date: 12/20/2015 CLINICAL DATA:  Shortness of breath and nausea EXAM: CT ANGIOGRAPHY CHEST WITH CONTRAST TECHNIQUE: Multidetector CT imaging of the chest was performed using the standard protocol during bolus administration  of intravenous contrast. Multiplanar CT image reconstructions and MIPs were obtained to evaluate the vascular anatomy. CONTRAST:  75 mL Isovue 370. COMPARISON:  None. FINDINGS: Mediastinum/Lymph Nodes: Thoracic inlet is within normal limits. No mediastinal or hilar adenopathy is noted. Cardiovascular: The thoracic aorta and its branches are within normal limits. The pulmonary artery demonstrates a normal branching pattern. No pulmonary emboli are seen. The cardiac structures are within normal limits. No significant coronary calcifications are noted. Lungs/Pleura: No pulmonary mass, infiltrate, or effusion. Upper abdomen: Small dependent gallstones are noted within the gallbladder. No other focal abnormality is noted in the upper abdomen. Musculoskeletal: No acute bony abnormality is noted. Review of the MIP images confirms the above findings. IMPRESSION: No evidence of pulmonary emboli. Small gallstones without complicating factors. Electronically Signed   By: Inez Catalina M.D.   On: 12/20/2015 18:07   Scheduled Meds: . aspirin EC  81 mg Oral Daily  . carvedilol  6.25 mg Oral BID WC  . enoxaparin (LOVENOX) injection  40 mg Subcutaneous Q24H  . LORazepam  1 mg Intravenous Once  . pantoprazole  40 mg Oral Daily  . sertraline  50 mg Oral QHS  . simvastatin  20 mg Oral q1800  . sodium chloride flush  3 mL Intravenous Q12H    Assessment/Plan:  1. Tachycardia unspecified, chest pain. Potentially SVT. Her LifeVest did not shock her. Resume previous dose of Coreg secondary to low pressure being low and heart rate being low. This could potentially be anxiety attack or arrhythmia. May need loop recorder or event monitor as outpatient. I spoke with Dr. Clayborn Bigness about whether or not this LifeVest would monitor arrhythmias or not. 2. Takotsubo cardiomyopathy. Continue Coreg. 3. Hyperlipidemia unspecified on simvastatin 4. GERD on Protonix 5. Anxiety on Zoloft and lorazepam  Code Status:     Code Status  Orders        Start     Ordered   12/20/15 2144  Full code  Continuous     12/20/15 2143    Code Status History    Date Active Date Inactive Code Status Order ID Comments User Context   11/26/2015  4:54 AM 11/27/2015  4:21 PM Full Code [id number]  Harrie Foreman, MD Inpatient     Disposition Plan: Potentially home tomorrow if no further arrhythmias  Consultants:  Cardiology  Time spent: 25 minutes  [place], [place]

## 2015-12-21 NOTE — Care Management (Signed)
Placed in observation for palpitations. Recent discharge following another observation stay for syncope.  Patient placed in zoll life vest during that stay due to low EF. Jenny Lee was considered during that admission but deferred to low blood pressure. Independent in all adls, denies issues accessing medical care, obtaining medications or with transportation.  Current with her PCP.  No discharge needs identified at present by care manager or members of care team.  Discussed the need to ambulate with patient .

## 2015-12-21 NOTE — Consult Note (Signed)
Reason for Consult: Syncope palpitations tachycardia Referring Physician: Dr. Lance Coon hospitalist  Jenny Lee is an 60 y.o. female.  HPI: Pt states to have trouble syncope  Eventually had a cardiac catheter around early July which showed apical dyskinesis suggestive of Takotsubo cardiomyopathy. Patient was placed in a LifeVest for what was reported to be depressed left ventricular function. Patient reportedly had an episode of tachycardia possibly SVT rhythm strips are not available reportedly felt poorly lightheaded week short of breath nausea vomiting so was brought to the emergency room and subsequently admitted for further evaluation. Patient is very anxious.  Past Medical History:  Diagnosis Date  . CAD (coronary artery disease)   . GERD (gastroesophageal reflux disease)   . Heart attack ([place])    2001  . Hypercholesteremia   . Hypertension   . Takotsubo cardiomyopathy     Past Surgical History:  Procedure Laterality Date  . ABDOMINAL HYSTERECTOMY  2004  . CARDIAC CATHETERIZATION N/A 11/26/2015   Procedure: Left Heart Cath and Coronary Angiography;  Surgeon: Dionisio David, MD;  Location: [facility];  Service: Cardiovascular;  Laterality: N/A;  . NECK SURGERY  1998    Family History  Problem Relation Age of Onset  . Breast cancer Sister 75    Social History:  reports that she has been smoking.  She has a 2.50 pack-year smoking history. She has never used smokeless tobacco. She reports that she does not drink alcohol or use drugs.  Allergies: No Known Allergies  Medications: I have reviewed the patient's current medications.  Results for orders placed or performed during the hospital encounter of 12/20/15 (from the past 48 hour(s))  CBC with Differential/Platelet     Status: None   Collection Time: 12/20/15  3:54 PM  Result Value Ref Range   WBC 9.1 3.6 - 11.0 K/uL   RBC 4.57 3.80 - 5.20 MIL/uL   Hemoglobin 13.1 12.0 - 16.0 g/dL   HCT 38.9 35.0 -  47.0 %   MCV 85.1 80.0 - 100.0 fL   MCH 28.7 26.0 - 34.0 pg   MCHC 33.7 32.0 - 36.0 g/dL   RDW 14.1 11.5 - 14.5 %   Platelets 235 150 - 440 K/uL   Neutrophils Relative % 59 %   Neutro Abs 5.5 1.4 - 6.5 K/uL   Lymphocytes Relative 34 %   Lymphs Abs 3.1 1.0 - 3.6 K/uL   Monocytes Relative 5 %   Monocytes Absolute 0.4 0.2 - 0.9 K/uL   Eosinophils Relative 1 %   Eosinophils Absolute 0.0 0 - 0.7 K/uL   Basophils Relative 1 %   Basophils Absolute 0.1 0 - 0.1 K/uL  Comprehensive metabolic panel     Status: Abnormal   Collection Time: 12/20/15  3:54 PM  Result Value Ref Range   Sodium 135 135 - 145 mmol/L   Potassium 3.9 3.5 - 5.1 mmol/L   Chloride 101 101 - 111 mmol/L   CO2 24 22 - 32 mmol/L   Glucose, Bld 177 (H) 65 - 99 mg/dL   BUN 16 6 - 20 mg/dL   Creatinine, Ser 0.88 0.44 - 1.00 mg/dL   Calcium 9.1 8.9 - 10.3 mg/dL   Total Protein 7.1 6.5 - 8.1 g/dL   Albumin 4.2 3.5 - 5.0 g/dL   AST 27 15 - 41 U/L   ALT 19 14 - 54 U/L   Alkaline Phosphatase 68 38 - 126 U/L   Total Bilirubin 0.6 0.3 - 1.2 mg/dL  GFR calc non Af Amer >60 >60 mL/min   GFR calc Af Amer >60 >60 mL/min    Comment: (NOTE) The eGFR has been calculated using the CKD EPI equation. This calculation has not been validated in all clinical situations. eGFR's persistently <60 mL/min signify possible Chronic Kidney Disease.    Anion gap 10 5 - 15  Troponin I     Status: None   Collection Time: 12/20/15  3:54 PM  Result Value Ref Range   Troponin I <0.03 <0.03 ng/mL  Lipase, blood     Status: None   Collection Time: 12/20/15  3:54 PM  Result Value Ref Range   Lipase 26 11 - 51 U/L  Brain natriuretic peptide     Status: None   Collection Time: 12/20/15  3:54 PM  Result Value Ref Range   B Natriuretic Peptide 41.0 0.0 - 100.0 pg/mL  Basic metabolic panel     Status: None   Collection Time: 12/21/15  4:32 AM  Result Value Ref Range   Sodium 140 135 - 145 mmol/L   Potassium 4.2 3.5 - 5.1 mmol/L   Chloride 106  101 - 111 mmol/L   CO2 29 22 - 32 mmol/L   Glucose, Bld 88 65 - 99 mg/dL   BUN 18 6 - 20 mg/dL   Creatinine, Ser 0.76 0.44 - 1.00 mg/dL   Calcium 8.9 8.9 - 10.3 mg/dL   GFR calc non Af Amer >60 >60 mL/min   GFR calc Af Amer >60 >60 mL/min    Comment: (NOTE) The eGFR has been calculated using the CKD EPI equation. This calculation has not been validated in all clinical situations. eGFR's persistently <60 mL/min signify possible Chronic Kidney Disease.    Anion gap 5 5 - 15  CBC     Status: None   Collection Time: 12/21/15  4:32 AM  Result Value Ref Range   WBC 10.0 3.6 - 11.0 K/uL   RBC 4.22 3.80 - 5.20 MIL/uL   Hemoglobin 12.2 12.0 - 16.0 g/dL   HCT 36.3 35.0 - 47.0 %   MCV 85.8 80.0 - 100.0 fL   MCH 28.8 26.0 - 34.0 pg   MCHC 33.6 32.0 - 36.0 g/dL   RDW 14.1 11.5 - 14.5 %   Platelets 222 150 - 440 K/uL    Dg Chest 2 View  Result Date: 12/20/2015 CLINICAL DATA:  Nausea and fatigue with rapid heart rate. EXAM: CHEST  2 VIEW COMPARISON:  11/26/2015. FINDINGS: The lungs are clear wiithout focal pneumonia, edema, pneumothorax or pleural effusion. The cardiopericardial silhouette is within normal limits for size. The visualized bony structures of the thorax are intact. IMPRESSION: Stable.  No acute cardiopulmonary findings. Electronically Signed   By: Misty Stanley M.D.   On: 12/20/2015 17:11  Ct Angio Chest Pe W Or Wo Contrast  Result Date: 12/20/2015 CLINICAL DATA:  Shortness of breath and nausea EXAM: CT ANGIOGRAPHY CHEST WITH CONTRAST TECHNIQUE: Multidetector CT imaging of the chest was performed using the standard protocol during bolus administration of intravenous contrast. Multiplanar CT image reconstructions and MIPs were obtained to evaluate the vascular anatomy. CONTRAST:  75 mL Isovue 370. COMPARISON:  None. FINDINGS: Mediastinum/Lymph Nodes: Thoracic inlet is within normal limits. No mediastinal or hilar adenopathy is noted. Cardiovascular: The thoracic aorta and its  branches are within normal limits. The pulmonary artery demonstrates a normal branching pattern. No pulmonary emboli are seen. The cardiac structures are within normal limits. No significant coronary calcifications are noted.  Lungs/Pleura: No pulmonary mass, infiltrate, or effusion. Upper abdomen: Small dependent gallstones are noted within the gallbladder. No other focal abnormality is noted in the upper abdomen. Musculoskeletal: No acute bony abnormality is noted. Review of the MIP images confirms the above findings. IMPRESSION: No evidence of pulmonary emboli. Small gallstones without complicating factors. Electronically Signed   By: Inez Catalina M.D.   On: 12/20/2015 18:07   Review of Systems  Constitutional: Positive for diaphoresis and malaise/fatigue.  HENT: Positive for congestion.   Eyes: Positive for blurred vision.  Cardiovascular: Positive for chest pain and palpitations.  Gastrointestinal: Negative.   Genitourinary: Negative.   Musculoskeletal: Negative.   Skin: Negative.   Neurological: Positive for dizziness, tingling, tremors and weakness.  Psychiatric/Behavioral: The patient is nervous/anxious.    Blood pressure (!) 107/50, pulse 63, temperature 98.2 F (36.8 C), temperature source Oral, resp. rate 16, height _0  (1.6 m), weight 69.8 kg (153 lb 12.8 oz), SpO2 97 %. Physical Exam  Constitutional: She is oriented to person, place, and time. She appears well-developed and well-nourished.  HENT:  Head: Normocephalic and atraumatic.  Eyes: Conjunctivae and EOM are normal. Pupils are equal, round, and reactive to light.  Neck: Normal range of motion. Neck supple.  Cardiovascular: Normal rate, regular rhythm and normal heart sounds.   Respiratory: Effort normal and breath sounds normal.  GI: Soft. Bowel sounds are normal.  Musculoskeletal: Normal range of motion.  Neurological: She is alert and oriented to person, place, and time. She has normal reflexes.  Skin: Skin is warm  and dry.  Psychiatric: She has a normal mood and affect.    Assessment/Plan: Takotsubo Cardiomyopathy History of SVT Tachycardia Syncope Reported history of CAD Anxiety Atypical chest pain Weakness Nausea and vomiting GERD Hyperlipidemia . Plan Agree with overnight telemetry evaluation because of syncope Continue telemetry for evaluation of left myocardial infarction Recommend beta-blockade therapy for  palpitations and tachycardia Hyperlipidemia therapy with Zocor and lovaza GERD currently on Protonix Anxiety consider benzodiazepines continue sertraline Depression continue sertraline therapy Currently in a LifeVest no discharges have the patient follow-up in EP Recommend discharge home soon follow-up with her primary cardiologist   Amyriah Buras D. 12/21/2015, 12:53 PM

## 2015-12-22 MED ORDER — CARVEDILOL 6.25 MG PO TABS: 6.2500 mg | ORAL_TABLET | Freq: Two times a day (BID) | ORAL | 0 refills | Status: DC

## 2015-12-22 NOTE — Discharge Summary (Signed)
[facility] [place] NAME: Jenny Lee    MR#:  [id number]  DATE OF BIRTH:  [birth year]  DATE OF ADMISSION:  12/20/2015 ADMITTING PHYSICIAN: Lance Coon, MD  DATE OF DISCHARGE: 12/22/15  PRIMARY CARE PHYSICIAN: Casilda Carls, MD    ADMISSION DIAGNOSIS:  Tachycardia [R00.0]  DISCHARGE DIAGNOSIS:  Principal Problem:   Tachycardia Active Problems:   Takotsubo cardiomyopathy   HTN (hypertension)   HLD (hyperlipidemia)   GERD (gastroesophageal reflux disease)   SECONDARY DIAGNOSIS:   Past Medical History:  Diagnosis Date  . CAD (coronary artery disease)   . GERD (gastroesophageal reflux disease)   . Heart attack ([place])    2001  . Hypercholesteremia   . Hypertension   . Takotsubo cardiomyopathy     HOSPITAL COURSE:  Jenny Lee  is a 60 y.o. female admitted 12/20/2015 with chief complaint Weakness and Nausea . Please see H&P performed by Lance Coon, MD for further information. Patient presented with with above symptoms in addition to palpitations. Spontaneous conversion to NSR. She has had intermittent episodes of sinus tachycardia during hospitalization. Evaluated by cardiology during her stay beta blockers adjusted. No further episodes while inpatient.  DISCHARGE CONDITIONS:   stable  CONSULTS OBTAINED:    DRUG ALLERGIES:  No Known Allergies  DISCHARGE MEDICATIONS:   Current Discharge Medication List    START taking these medications   Details  !! carvedilol (COREG) 6.25 MG tablet Take 1 tablet (6.25 mg total) by mouth 2 (two) times daily with a meal. Qty: 60 tablet, Refills: 0     !! - Potential duplicate medications found. Please discuss with provider.    CONTINUE these medications which have NOT CHANGED   Details  aspirin 81 MG tablet Take 81 mg by mouth daily.    !! carvedilol (COREG) 6.25 MG tablet Take 1 tablet by mouth. Refills: 0    estradiol (ESTRACE) 0.5 MG tablet Take 0.5 mg by mouth  daily.    omega-3 acid ethyl esters (LOVAZA) 1 g capsule Take 2 g by mouth daily at 3 pm.     pantoprazole (PROTONIX) 40 MG tablet Take 1 tablet by mouth daily. Refills: 1    sertraline (ZOLOFT) 50 MG tablet Take 50 mg by mouth at bedtime.     simvastatin (ZOCOR) 20 MG tablet Take 20 mg by mouth daily at 6 PM.  Refills: 4     !! - Potential duplicate medications found. Please discuss with provider.    STOP taking these medications     metoprolol tartrate (LOPRESSOR) 25 MG tablet          DISCHARGE INSTRUCTIONS:   Follow up with your cardiologist as an outpatient DIET:  Cardiac diet  DISCHARGE CONDITION:  Stable  ACTIVITY:  Activity as tolerated  OXYGEN:  Home Oxygen: No.   Oxygen Delivery: room air  DISCHARGE LOCATION:  home   If you experience worsening of your admission symptoms, develop shortness of breath, life threatening emergency, suicidal or homicidal thoughts you must seek medical attention immediately by calling 911 or calling your MD immediately  if symptoms less severe.  You Must read complete instructions/literature along with all the possible adverse reactions/side effects for all the Medicines you take and that have been prescribed to you. Take any new Medicines after you have completely understood and accpet all the possible adverse reactions/side effects.   Please note  You were cared for by a hospitalist during your hospital stay. If you have  any questions about your discharge medications or the care you received while you were in the hospital after you are discharged, you can call the unit and asked to speak with the hospitalist on call if the hospitalist that took care of you is not available. Once you are discharged, your primary care physician will handle any further medical issues. Please note that NO REFILLS for any discharge medications will be authorized once you are discharged, as it is imperative that you return to your primary care  physician (or establish a relationship with a primary care physician if you do not have one) for your aftercare needs so that they can reassess your need for medications and monitor your lab values.    On the day of Discharge:   VITAL SIGNS:  Blood pressure 116/86, pulse (!) 58, temperature 97.5 F (36.4 C), temperature source Oral, resp. rate 15, height 5\' 3"  (1.6 m), weight 153 lb 12.8 oz (69.8 kg), SpO2 97 %.  I/O:   Intake/Output Summary (Last 24 hours) at 12/22/15 1026 Last data filed at 12/21/15 2100  Gross per 24 hour  Intake              360 ml  Output              820 ml  Net             -460 ml    PHYSICAL EXAMINATION:  GENERAL:  60 y.o.-year-old patient lying in the bed with no acute distress.  EYES: Pupils equal, round, reactive to light and accommodation. No scleral icterus. Extraocular muscles intact.  HEENT: Head atraumatic, normocephalic. Oropharynx and nasopharynx clear.  NECK:  Supple, no jugular venous distention. No thyroid enlargement, no tenderness.  LUNGS: Normal breath sounds bilaterally, no wheezing, rales,rhonchi or crepitation. No use of accessory muscles of respiration.  CARDIOVASCULAR: S1, S2 normal. No murmurs, rubs, or gallops.  ABDOMEN: Soft, non-tender, non-distended. Bowel sounds present. No organomegaly or mass.  EXTREMITIES: No pedal edema, cyanosis, or clubbing.  NEUROLOGIC: Cranial nerves II through XII are intact. Muscle strength 5/5 in all extremities. Sensation intact. Gait not checked.  PSYCHIATRIC: The patient is alert and oriented x 3.  SKIN: No obvious rash, lesion, or ulcer.   DATA REVIEW:   CBC  Recent Labs Lab 12/21/15 0432  WBC 10.0  HGB 12.2  HCT 36.3  PLT 222    Chemistries   Recent Labs Lab 12/20/15 1554 12/21/15 0432  NA 135 140  K 3.9 4.2  CL 101 106  CO2 24 29  GLUCOSE 177* 88  BUN 16 18  CREATININE 0.88 0.76  CALCIUM 9.1 8.9  AST 27  --   ALT 19  --   ALKPHOS 68  --   BILITOT 0.6  --     Cardiac  Enzymes  Recent Labs Lab 12/20/15 [place] <0.03    Microbiology Results  No results found for this or any previous visit.  RADIOLOGY:  Dg Chest 2 View  Result Date: 12/20/2015 CLINICAL DATA:  Nausea and fatigue with rapid heart rate. EXAM: CHEST  2 VIEW COMPARISON:  11/26/2015. FINDINGS: The lungs are clear wiithout focal pneumonia, edema, pneumothorax or pleural effusion. The cardiopericardial silhouette is within normal limits for size. The visualized bony structures of the thorax are intact. IMPRESSION: Stable.  No acute cardiopulmonary findings. Electronically Signed   By: Misty Stanley M.D.   On: 12/20/2015 17:11  Ct Angio Chest Pe W Or Wo Contrast  Result Date:  12/20/2015 CLINICAL DATA:  Shortness of breath and nausea EXAM: CT ANGIOGRAPHY CHEST WITH CONTRAST TECHNIQUE: Multidetector CT imaging of the chest was performed using the standard protocol during bolus administration of intravenous contrast. Multiplanar CT image reconstructions and MIPs were obtained to evaluate the vascular anatomy. CONTRAST:  75 mL Isovue 370. COMPARISON:  None. FINDINGS: Mediastinum/Lymph Nodes: Thoracic inlet is within normal limits. No mediastinal or hilar adenopathy is noted. Cardiovascular: The thoracic aorta and its branches are within normal limits. The pulmonary artery demonstrates a normal branching pattern. No pulmonary emboli are seen. The cardiac structures are within normal limits. No significant coronary calcifications are noted. Lungs/Pleura: No pulmonary mass, infiltrate, or effusion. Upper abdomen: Small dependent gallstones are noted within the gallbladder. No other focal abnormality is noted in the upper abdomen. Musculoskeletal: No acute bony abnormality is noted. Review of the MIP images confirms the above findings. IMPRESSION: No evidence of pulmonary emboli. Small gallstones without complicating factors. Electronically Signed   By: Inez Catalina M.D.   On: 12/20/2015  18:07    Management plans discussed with the patient, family and they are in agreement.  CODE STATUS:     Code Status Orders        Start     Ordered   12/20/15 2144  Full code  Continuous     12/20/15 2143    Code Status History    Date Active Date Inactive Code Status Order ID Comments User Context   11/26/2015  4:54 AM 11/27/2015  4:21 PM Full Code [id number]  Harrie Foreman, MD Inpatient      TOTAL TIME TAKING CARE OF THIS PATIENT: 28 minutes.    Braxston Quinter,  Karenann Cai.D on 12/22/2015 at 10:26 AM  Between 7am to 6pm - Pager - [phone]  After 6pm go to [email or web address] - Proofreader  [facility]  Office  [phone]  CC: Primary care physician; Casilda Carls, MD

## 2015-12-22 NOTE — Progress Notes (Signed)
Pt discharged, IV and telemetry removed, reviewed discharge instructions and home meds, rx given and printed med list, no questions verbalized, escorted by volunteer

## 2015-12-28 ENCOUNTER — Encounter: Payer: Self-pay | Admitting: *Deleted

## 2016-01-07 ENCOUNTER — Ambulatory Visit: Payer: BLUE CROSS/BLUE SHIELD | Admitting: Podiatry

## 2016-02-05 ENCOUNTER — Other Ambulatory Visit: Payer: Self-pay | Admitting: Internal Medicine

## 2016-02-05 DIAGNOSIS — R109 Unspecified abdominal pain: Secondary | ICD-10-CM

## 2016-02-11 ENCOUNTER — Ambulatory Visit
Admission: RE | Admit: 2016-02-11 | Discharge: 2016-02-11 | Disposition: A | Discharge status: Home / Self Care | Payer: BLUE CROSS/BLUE SHIELD | Source: Ambulatory Visit | Attending: Internal Medicine | Admitting: Internal Medicine

## 2016-02-11 DIAGNOSIS — K8020 Calculus of gallbladder without cholecystitis without obstruction: Secondary | ICD-10-CM | POA: Insufficient documentation

## 2016-02-11 DIAGNOSIS — R1011 Right upper quadrant pain: Secondary | ICD-10-CM | POA: Insufficient documentation

## 2016-02-11 DIAGNOSIS — R109 Unspecified abdominal pain: Secondary | ICD-10-CM

## 2016-04-29 DIAGNOSIS — F419 Anxiety disorder, unspecified: Secondary | ICD-10-CM | POA: Insufficient documentation

## 2016-06-24 ENCOUNTER — Other Ambulatory Visit: Payer: Self-pay | Admitting: Internal Medicine

## 2016-06-24 DIAGNOSIS — Z1231 Encounter for screening mammogram for malignant neoplasm of breast: Secondary | ICD-10-CM

## 2016-06-27 ENCOUNTER — Other Ambulatory Visit: Payer: Self-pay

## 2016-07-02 ENCOUNTER — Ambulatory Visit: Payer: Self-pay | Admitting: Surgery

## 2016-07-02 ENCOUNTER — Other Ambulatory Visit
Admission: RE | Admit: 2016-07-02 | Discharge: 2016-07-02 | Disposition: A | Discharge status: Home / Self Care | Payer: BLUE CROSS/BLUE SHIELD | Source: Ambulatory Visit | Attending: Surgery | Admitting: Surgery

## 2016-07-02 ENCOUNTER — Encounter: Payer: Self-pay | Admitting: Surgery

## 2016-07-02 ENCOUNTER — Ambulatory Visit (INDEPENDENT_AMBULATORY_CARE_PROVIDER_SITE_OTHER): Payer: BLUE CROSS/BLUE SHIELD | Admitting: Surgery

## 2016-07-02 VITALS — BP 164/92 | HR 82 | Temp 98.1°F | Ht 63.0 in | Wt 163.4 lb

## 2016-07-02 DIAGNOSIS — R1012 Left upper quadrant pain: Secondary | ICD-10-CM | POA: Diagnosis not present

## 2016-07-02 DIAGNOSIS — M549 Dorsalgia, unspecified: Secondary | ICD-10-CM | POA: Diagnosis not present

## 2016-07-02 DIAGNOSIS — K8020 Calculus of gallbladder without cholecystitis without obstruction: Secondary | ICD-10-CM | POA: Insufficient documentation

## 2016-07-02 LAB — COMPREHENSIVE METABOLIC PANEL
ALBUMIN: 4.6 g/dL (ref 3.5–5.0)
ALK PHOS: 75 U/L (ref 38–126)
ALT: 18 U/L (ref 14–54)
ANION GAP: 7 (ref 5–15)
AST: 22 U/L (ref 15–41)
BUN: 17 mg/dL (ref 6–20)
CALCIUM: 9.4 mg/dL (ref 8.9–10.3)
CHLORIDE: 103 mmol/L (ref 101–111)
CO2: 27 mmol/L (ref 22–32)
Creatinine, Ser: 0.86 mg/dL (ref 0.44–1.00)
GFR calc non Af Amer: >60 mL/min (ref >60)
GLUCOSE: 99 mg/dL (ref 65–99)
POTASSIUM: 4.8 mmol/L (ref 3.5–5.1)
SODIUM: 137 mmol/L (ref 135–145)
Total Bilirubin: 0.4 mg/dL (ref 0.3–1.2)
Total Protein: 8 g/dL (ref 6.5–8.1)

## 2016-07-02 LAB — CBC WITH DIFFERENTIAL/PLATELET
Basophils Absolute: 0.1 10*3/uL (ref 0–0.1)
Basophils Relative: 1 %
Eosinophils Absolute: 0.1 10*3/uL (ref 0–0.7)
Eosinophils Relative: 1 %
HEMATOCRIT: 38.8 % (ref 35.0–47.0)
Hemoglobin: 13 g/dL (ref 12.0–16.0)
LYMPHS ABS: 3.4 10*3/uL (ref 1.0–3.6)
LYMPHS PCT: 35 %
MCH: 28 pg (ref 26.0–34.0)
MCHC: 33.4 g/dL (ref 32.0–36.0)
MCV: 83.8 fL (ref 80.0–100.0)
MONOS PCT: 6 %
Monocytes Absolute: 0.6 10*3/uL (ref 0.2–0.9)
NEUTROS ABS: 5.7 10*3/uL (ref 1.4–6.5)
Neutrophils Relative %: 57 %
Platelets: 258 10*3/uL (ref 150–440)
RBC: 4.63 MIL/uL (ref 3.80–5.20)
RDW: 14 % (ref 11.5–14.5)
WBC: 9.9 10*3/uL (ref 3.6–11.0)

## 2016-07-02 NOTE — Patient Instructions (Addendum)
We have ordered labs today. Please go to the [facility] after your appointment to have these completed.  Directions to Medical Mall: When leaving our office, go right. Go all of the way down to the very end of the hallway. You will have a purple wall in front of you. You will now have a tunnel to the hospital on your left hand side. Go through this tunnel and the elevators will be on your left. Go down to the 1st floor and take a slight left. The very first desk on the right hand side is the registration desk.  We have scheduled an appointment with a Gastroenterologist for evaluation. Please see appointment below.  After seeing the Gastroenterologist, you will follow-up with Dr. Burt Knack (Surgeon) as scheduled.

## 2016-07-02 NOTE — Progress Notes (Signed)
Jenny Lee is an 61 y.o. female.   Chief Complaint: LUQ pain HPI: This a patient with occasional left upper quadrant pain she also points to the epigastrium but denies any right upper quadrant pain her right sided back pain. Her pain radiates from her left upper quadrant to her left back. She has extensive nausea and in fact the nausea is really her chief complaint. Vomiting and no weight loss and this does not relate to any sort of particular food especially fatty foods. She has never seen a GI doctor that she knows of and history has it was many years ago when she had a "heart attack" She denies jaundice or acholic stools no fevers or chills this is been going on since last July. She's had no recent lab tests and her ultrasound was dated last September  Past Medical History:  Diagnosis Date  . CAD (coronary artery disease)   . GERD (gastroesophageal reflux disease)   . Heart attack    2001  . Hypercholesteremia   . Hypertension   . Takotsubo cardiomyopathy     Past Surgical History:  Procedure Laterality Date  . ABDOMINAL HYSTERECTOMY  2004  . CARDIAC CATHETERIZATION N/A 11/26/2015   Procedure: Left Heart Cath and Coronary Angiography;  Surgeon: Dionisio David, MD;  Location: [facility];  Service: Cardiovascular;  Laterality: N/A;  . NECK SURGERY  1998   No family history of gallbladder disease. Family History  Problem Relation Age of Onset  . Breast cancer Sister 80   Social History:  reports that she has been smoking.  She has a 2.50 pack-year smoking history. She has never used smokeless tobacco. She reports that she does not drink alcohol or use drugs. Patient denies smoking to me. And does not drink alcohol. 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  Allergies: No Known Allergies   (Not in a hospital admission)   Review of Systems:   Review of Systems  Constitutional: Negative for chills, fever and weight loss.  HENT: Negative.   Eyes: Negative.    Respiratory: Negative.   Cardiovascular: Negative.   Gastrointestinal: Positive for abdominal pain and nausea. Negative for blood in stool, constipation, diarrhea, heartburn and vomiting.  Genitourinary: Negative.   Musculoskeletal: Negative.   Skin: Positive for rash. Negative for itching.  Neurological: Negative.   Endo/Heme/Allergies: Negative.   Psychiatric/Behavioral: Negative.     Physical Exam:  Physical Exam  Constitutional: She is oriented to person, place, and time and well-developed, well-nourished, and in no distress. No distress.  HENT:  Head: Normocephalic and atraumatic.  Eyes: Pupils are equal, round, and reactive to light. Right eye exhibits no discharge. Left eye exhibits no discharge. No scleral icterus.  Neck: Normal range of motion.  Cardiovascular: Normal rate, regular rhythm and normal heart sounds.   Hypertensive  Pulmonary/Chest: Effort normal and breath sounds normal. No respiratory distress. She has no wheezes. She has no rales.  Abdominal: Soft. She exhibits no distension. There is no tenderness. There is no rebound and no guarding.  Laparoscopy scars noted  Musculoskeletal: Normal range of motion. She exhibits no edema or tenderness.  Lymphadenopathy:    She has no cervical adenopathy.  Neurological: She is alert and oriented to person, place, and time.  Skin: Skin is warm and dry. Rash noted. She is not diaphoretic. No erythema.  Psychiatric: Mood and affect normal.  Vitals reviewed.   Blood pressure (!) 164/92, pulse 82, temperature 98.1 F (36.7 C), temperature source Oral,  height 5\' 3"  (1.6 m), weight 163 lb 6.4 oz (74.1 kg).    No results found for this or any previous visit (from the past 48 hour(s)). No results found.   Assessment/Plan All the patient's labs and studies are from last year in fact last summer over 6 months ago. I would like to repeat her LFTs at this time. Her left upper quadrant and left back pain did not suggest that  this is gallbladder disease although she does have gallstones on a study from September 2017 why do not need to repeat her ultrasound I would like to obtain labs at this time and obtain a GI consultation and see her back after that for possible cholecystectomy should her symptoms become more gallbladder related.  Florene Glen, MD, FACS

## 2016-07-03 ENCOUNTER — Telehealth: Payer: Self-pay

## 2016-07-03 NOTE — Telephone Encounter (Signed)
Phone call made to patient at this time to let her know that her CMP and CBC done yesterday were all normal. She was encouraged to follow-up with Dr. Allen Norris as scheduled and then we will see her back after the work-up from Gastroenterology.

## 2016-07-24 ENCOUNTER — Encounter: Payer: Self-pay | Admitting: Gastroenterology

## 2016-07-24 ENCOUNTER — Ambulatory Visit (INDEPENDENT_AMBULATORY_CARE_PROVIDER_SITE_OTHER): Payer: BLUE CROSS/BLUE SHIELD | Admitting: Gastroenterology

## 2016-07-24 VITALS — BP 141/70 | HR 71 | Temp 98.1°F | Ht 64.0 in | Wt 163.0 lb

## 2016-07-24 DIAGNOSIS — R1012 Left upper quadrant pain: Secondary | ICD-10-CM

## 2016-07-24 NOTE — Progress Notes (Signed)
Gastroenterology Consultation  Referring Provider:     Casilda Carls Primary Care Physician:  Casilda Carls Primary Gastroenterologist:  Dr. Allen Norris     Reason for Consultation:     Abdominal pain        HPI:   Jenny Lee is a 61 y.o. y/o female referred for consultation & management of Abdominal pain by Dr. Casilda Carls.  This patient comes to see me after being seen by Dr. Burt Knack for abdominal pain.  The patient was found to have gallstones for which Dr. Burt Knack was not convinced that her pain was due to the gallstones.  The patient's main pain is on her left side and not her right upper quadrant.  She states that her left side bulges out more than the right side does.  There is no report of any unexplained weight loss.  The patient states that her symptoms are worse when she lays down at night.  The symptoms are not associated with eating drinking or moving her bowels.  She denies any change in bowel habits such as diarrhea or constipation.  The pain is also not made any better or worse with greasy foods or fatty foods.  She also denies that spicy foods make her symptoms worse.  Past Medical History:  Diagnosis Date  . CAD (coronary artery disease)   . GERD (gastroesophageal reflux disease)   . Heart attack    2001  . Hypercholesteremia   . Hypertension   . Takotsubo cardiomyopathy     Past Surgical History:  Procedure Laterality Date  . ABDOMINAL HYSTERECTOMY  2004  . CARDIAC CATHETERIZATION N/A 11/26/2015   Procedure: Left Heart Cath and Coronary Angiography;  Surgeon: Dionisio David, MD;  Location: [facility];  Service: Cardiovascular;  Laterality: N/A;  . NECK SURGERY  1998    Prior to Admission medications   Medication Sig Start Date End Date Taking? Authorizing Provider  ALPRAZolam Duanne Moron) 0.5 MG tablet Take 0.5 mg by mouth at bedtime as needed for anxiety.   Yes Historical Provider, MD  aspirin 81 MG tablet Take 81 mg by mouth daily.   Yes Historical  Provider, MD  carvedilol (COREG) 12.5 MG tablet Take 12.5 mg by mouth 2 (two) times daily with a meal.   Yes Historical Provider, MD  enalapril (VASOTEC) 20 MG tablet Take 20 mg by mouth daily.   Yes Historical Provider, MD  estradiol (ESTRACE) 0.5 MG tablet Take 0.5 mg by mouth daily.   Yes Historical Provider, MD  Melatonin 5 MG TABS Take 5 mg by mouth daily.   Yes Historical Provider, MD  ondansetron (ZOFRAN-ODT) 4 MG disintegrating tablet Take 4 mg by mouth every 8 (eight) hours as needed for nausea or vomiting.   Yes Historical Provider, MD  pantoprazole (PROTONIX) 40 MG tablet Take 1 tablet by mouth daily. 12/04/15  Yes Historical Provider, MD  sertraline (ZOLOFT) 50 MG tablet Take 50 mg by mouth at bedtime.    Yes Historical Provider, MD  simvastatin (ZOCOR) 20 MG tablet Take 20 mg by mouth daily at 6 PM.  10/10/15  Yes Historical Provider, MD    Family History  Problem Relation Age of Onset  . Breast cancer Sister 88     Social History  Substance Use Topics  . Smoking status: Current Some Day Smoker    Packs/day: 0.50    Years: 5.00  . Smokeless tobacco: Never Used  . Alcohol use No    Allergies as of  07/24/2016  . (No Known Allergies)    Review of Systems:    All systems reviewed and negative except where noted in HPI.   Physical Exam:  BP (!) 141/70   Pulse 71   Temp 98.1 F (36.7 C) (Oral)   Ht 5\' 4"  (1.626 m)   Wt 163 lb (73.9 kg)   BMI 27.98 kg/m  No LMP recorded. Patient has had a hysterectomy. Psych:  Alert and cooperative. Normal mood and affect. General:   Alert,  Well-developed, well-nourished, pleasant and cooperative in NAD Head:  Normocephalic and atraumatic. Eyes:  Sclera clear, no icterus.   Conjunctiva pink. Ears:  Normal auditory acuity. Nose:  No deformity, discharge, or lesions. Mouth:  No deformity or lesions,oropharynx pink & moist. Neck:  Supple; no masses or thyromegaly. Lungs:  Respirations even and unlabored.  Clear throughout to  auscultation.   No wheezes, crackles, or rhonchi. No acute distress. Heart:  Regular rate and rhythm; no murmurs, clicks, rubs, or gallops. Abdomen:  Normal bowel sounds.  No bruits.  Soft, Positive tenderness to one finger light palpation while flexing the abdominal wall muscles by raising the patient's legs 6 inches above the exam table. and non-distended without masses, hepatosplenomegaly or hernias noted.  No guarding or rebound tenderness.  Positive Carnett sign.   Rectal:  Deferred.  Msk:  Symmetrical without gross deformities.  Good, equal movement & strength bilaterally. Pulses:  Normal pulses noted. Extremities:  No clubbing or edema.  No cyanosis. Neurologic:  Alert and oriented x3;  grossly normal neurologically. Skin:  Intact without significant lesions or rashes.  No jaundice. Lymph Nodes:  No significant cervical adenopathy. Psych:  Alert and cooperative. Normal mood and affect.  Imaging Studies: No results found.  Assessment and Plan:   Jenny Lee is a 61 y.o. y/o female who comes in with abdominal pain.  The abdominal pain is not related to any of her GI function such as eating drinking or going to the bathroom.  The symptoms are not made better or worse with defecation.  The patient on physical exam has findings consistent with musculoskeletal pain.  The pain is easily reproducible with one finger palpation while flexing the abdominal wall muscles. The patient has been told my findings and has been told to use warm compresses and NSAIDs for her pain and inflammation of her muscles.  She has also been told that the laxity of the muscles on the left side other reason that she feels that her left side is bulging out.  The patient will follow-up with me as needed.  She has been explained the plan and agrees with it.    [name], MD. Marval Regal   Note: This dictation was prepared with Dragon dictation along with smaller phrase technology. Any transcriptional errors that result  from this process are unintentional.

## 2016-08-07 ENCOUNTER — Ambulatory Visit: Payer: Self-pay | Admitting: Surgery

## 2016-08-27 ENCOUNTER — Ambulatory Visit
Admission: RE | Admit: 2016-08-27 | Discharge: 2016-08-27 | Disposition: A | Discharge status: Home / Self Care | Payer: BLUE CROSS/BLUE SHIELD | Source: Ambulatory Visit | Attending: Internal Medicine | Admitting: Internal Medicine

## 2016-08-27 DIAGNOSIS — Z1231 Encounter for screening mammogram for malignant neoplasm of breast: Secondary | ICD-10-CM

## 2016-09-04 ENCOUNTER — Other Ambulatory Visit: Payer: Self-pay | Admitting: *Deleted

## 2016-09-04 ENCOUNTER — Inpatient Hospital Stay
Admission: RE | Admit: 2016-09-04 | Discharge: 2016-09-04 | Disposition: A | Discharge status: Home / Self Care | Payer: Self-pay | Source: Ambulatory Visit | Attending: *Deleted | Admitting: *Deleted

## 2016-09-04 DIAGNOSIS — Z1231 Encounter for screening mammogram for malignant neoplasm of breast: Secondary | ICD-10-CM

## 2016-09-10 ENCOUNTER — Ambulatory Visit (INDEPENDENT_AMBULATORY_CARE_PROVIDER_SITE_OTHER): Payer: BLUE CROSS/BLUE SHIELD | Admitting: Surgery

## 2016-09-10 ENCOUNTER — Other Ambulatory Visit: Payer: Self-pay

## 2016-09-10 ENCOUNTER — Encounter: Payer: Self-pay | Admitting: Surgery

## 2016-09-10 VITALS — BP 137/82 | HR 68 | Temp 97.8°F | Wt 163.0 lb

## 2016-09-10 DIAGNOSIS — K8020 Calculus of gallbladder without cholecystitis without obstruction: Secondary | ICD-10-CM | POA: Diagnosis not present

## 2016-09-10 NOTE — Patient Instructions (Signed)
Please give [place] a call if you want [place] to refer you to a General Surgeon or Gastroenterologist for a second opinion.

## 2016-09-10 NOTE — Progress Notes (Signed)
Outpatient Surgical Follow Up  09/10/2016  Jenny Lee is an 61 y.o. female.   CC: Nausea and Left Upper quadrant pain  HPI: This a patient with gallstones and left upper quadrant pain she was sent to GI for consideration of alternatives. Dr. Allen Lee saw the patient and his consultation is been reviewed. Jenny Lee identified what sounds like musculoskeletal discomfort in the left upper quadrant not related to her gallstones.  Patient describes left upper quadrant pain and left back pain with nausea nausea is actually her chief complaint. She states that occasionally it starts in the right upper quadrant and radiates around but it is mostly left upper quadrant. She denies any problems with any kind of foods and kidney E whatever she wants. There is no fatty food intolerance. Has not vomited. His fevers or chills. She has not lost any weight.  Past Medical History:  Diagnosis Date  . CAD (coronary artery disease)   . Female genuine stress incontinence 08/15/2014  . GERD (gastroesophageal reflux disease)   . H/O acute myocardial infarction 08/15/2014   Overview:  2002   . Heart attack ([place])    2001  . HLD (hyperlipidemia) 12/20/2015  . HTN (hypertension) 12/20/2015  . Hx of cardiac cath 12/11/2015   Overview:  Normal coronary arteries 2017  . Hypercholesteremia   . Hypertension   . Mastalgia 10/06/2012  . 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 Tachycardia 12/20/2015  . Takotsubo cardiomyopathy     Past Surgical History:  Procedure Laterality Date  . ABDOMINAL HYSTERECTOMY  2004  . CARDIAC CATHETERIZATION N/A 11/26/2015   Procedure: Left Heart Cath and Coronary Angiography;  Surgeon: Dionisio David, MD;  Location: [facility];  Service: Cardiovascular;  Laterality: N/A;  . NECK SURGERY  1998    Family History  Problem Relation Age of Onset  . Breast cancer Sister 40    Social History:  reports that she has been smoking.  She has a 2.50 pack-year smoking  history. She has never used smokeless tobacco. She reports that she does not drink alcohol or use drugs.  Allergies: No Known Allergies  Medications reviewed.   Review of Systems:   Review of Systems  Constitutional: Positive for malaise/fatigue. Negative for chills, fever and weight loss.  HENT: Negative.   Eyes: Negative.   Respiratory: Negative.   Cardiovascular: Negative.   Gastrointestinal: Positive for abdominal pain and nausea. Negative for blood in stool, constipation, diarrhea, heartburn and vomiting.  Genitourinary: Negative.   Musculoskeletal: Negative.   Skin: Negative.   Neurological: Positive for weakness.  Endo/Heme/Allergies: Negative.   Psychiatric/Behavioral: Negative.      Physical Exam:  BP 137/82   Pulse 68   Temp 97.8 F (36.6 C) (Oral)   Wt 163 lb (73.9 kg)   BMI 27.98 kg/m   Physical Exam  Constitutional: She is oriented to person, place, and time and well-developed, well-nourished, and in no distress. No distress.  HENT:  Head: Normocephalic and atraumatic.  Eyes: Pupils are equal, round, and reactive to light. Right eye exhibits no discharge. Left eye exhibits no discharge. No scleral icterus.  Pulmonary/Chest: Effort normal. No respiratory distress.  Abdominal: Soft. She exhibits no distension. There is no tenderness. There is no rebound and no guarding.  No right upper quadrant tenderness negative Murphy sign no mass scarred umbilicus  Musculoskeletal: Normal range of motion. She exhibits no edema or tenderness.  Neurological: She is alert and oriented to person, place, and time.  Skin: Skin is warm and dry. No rash noted. She is not diaphoretic. No erythema.  Psychiatric: Mood and affect normal.  Vitals reviewed.     No results found for this or any previous visit (from the past 48 hour(s)). No results found.  Assessment/Plan:  This patient who is now had a workup to include GI consultation and neither I nor Dr. Allen Lee  believe that  this is gallbladder related her symptoms do not fit the typical pattern of gallbladder disease. She does not have postprandial pain associated fatty food intolerance her pain is in the left upper quadrant and left back rarely if ever in the right upper quadrant. She can eat whatever she wants she's not lost any weight and she has not vomited. Her chief complaint is nausea which would not be typical of gallbladder disease either. I will discuss with the patient he options but I would not recommend surgical intervention in this patient and I may consider offering a second opinion should she request that. The patient has had an EGD in the past but has not had a colonoscopy. Dr. Allen Lee did not recommend that but I believe a colonoscopy might be in order. I suggested that she could see a second opinion from GI or from general surgery in [place] and we would be happy to arrange that. She has a family friend that they are talking to concerning further referrals. She'll follow-up with [place] on an as-needed basis  Jenny Glen, MD, FACS

## 2016-10-08 ENCOUNTER — Telehealth: Payer: Self-pay

## 2016-10-08 NOTE — Telephone Encounter (Signed)
-----   Message from Glennie Isle, [place] sent at 09/15/2016 10:11 AM EDT ----- Regarding: RE: Question Good Morning!! Yes, Dr. Allen Norris has recommended her to have a double. I have spoken with the pt and she is speaking with her husband to find out which date works best for her. I am waiting on a return call.    ----- Message ----- From: Wayna Chalet, CMA Sent: 09/15/2016   9:40 AM To: Glennie Isle, CMA Subject: RE: Question                                   Good morning! I just wanted to follow up with what would be done for this patient. Has Dr. Allen Norris said anything?   ----- Message ----- From: Glennie Isle, CMA Sent: 09/10/2016   1:55 PM To: Wayna Chalet, CMA Subject: RE: Question                                   I will forward this to Dr. Allen Norris. Most likely he will suggest a Colonoscopy and EGD to rule out anything else GI related. I will call her tomorrow once he advises me if this would be the next step. I will let you know.  Thank you!!   Ginger ----- Message ----- From: Wayna Chalet, CMA Sent: 09/10/2016   1:41 PM To: Glennie Isle, CMA Subject: Question                                       We saw this patient today for a follow up on her abdominal pain. She had seen Dr. Allen Norris on 07/24/2016 and he thought it was musculoskeletal pain. However, when the came in today, she still believes that she has something wrong internally. Is there any way that Dr. Allen Norris could see her again and maybe double scope her. She has not had a colonoscopy before. Can you help me with this patient? What do you or Dr. Allen Norris recommend?

## 2016-12-03 ENCOUNTER — Other Ambulatory Visit: Payer: Self-pay

## 2016-12-03 ENCOUNTER — Telehealth: Payer: Self-pay | Admitting: Gastroenterology

## 2016-12-03 DIAGNOSIS — R1012 Left upper quadrant pain: Secondary | ICD-10-CM

## 2016-12-03 NOTE — Telephone Encounter (Signed)
Pt scheduled for an EGD and Colonoscopy at [facility] with Vicente Males on 12/23/16.  Instructs/rx mailed.

## 2016-12-03 NOTE — Telephone Encounter (Signed)
Patient stated that you wanted her to touch base with you. Please call

## 2016-12-09 ENCOUNTER — Other Ambulatory Visit: Payer: Self-pay

## 2016-12-09 DIAGNOSIS — R1013 Epigastric pain: Secondary | ICD-10-CM

## 2016-12-09 MED ORDER — PEG 3350-KCL-NA BICARB-NACL 420 G PO SOLR
4000.0000 mL | Freq: Once | ORAL | 0 refills | Status: AC
Start: 2016-12-09 — End: 2016-12-09

## 2016-12-22 ENCOUNTER — Other Ambulatory Visit: Payer: Self-pay | Admitting: Internal Medicine

## 2016-12-22 DIAGNOSIS — I10 Essential (primary) hypertension: Secondary | ICD-10-CM

## 2016-12-23 ENCOUNTER — Ambulatory Visit: Payer: BLUE CROSS/BLUE SHIELD | Admitting: Anesthesiology

## 2016-12-23 ENCOUNTER — Encounter
Admission: RE | Disposition: A | Discharge status: Home / Self Care | Payer: Self-pay | Source: Ambulatory Visit | Attending: Gastroenterology

## 2016-12-23 ENCOUNTER — Ambulatory Visit
Admission: RE | Admit: 2016-12-23 | Discharge: 2016-12-23 | Disposition: A | Discharge status: Home / Self Care | Payer: BLUE CROSS/BLUE SHIELD | Source: Ambulatory Visit | Attending: Gastroenterology | Admitting: Gastroenterology

## 2016-12-23 ENCOUNTER — Encounter: Payer: Self-pay | Admitting: *Deleted

## 2016-12-23 DIAGNOSIS — K219 Gastro-esophageal reflux disease without esophagitis: Secondary | ICD-10-CM | POA: Diagnosis not present

## 2016-12-23 DIAGNOSIS — F17210 Nicotine dependence, cigarettes, uncomplicated: Secondary | ICD-10-CM | POA: Insufficient documentation

## 2016-12-23 DIAGNOSIS — E7800 Pure hypercholesterolemia, unspecified: Secondary | ICD-10-CM | POA: Diagnosis not present

## 2016-12-23 DIAGNOSIS — K2950 Unspecified chronic gastritis without bleeding: Secondary | ICD-10-CM | POA: Insufficient documentation

## 2016-12-23 DIAGNOSIS — Z7982 Long term (current) use of aspirin: Secondary | ICD-10-CM | POA: Diagnosis not present

## 2016-12-23 DIAGNOSIS — B9681 Helicobacter pylori [H. pylori] as the cause of diseases classified elsewhere: Secondary | ICD-10-CM | POA: Diagnosis not present

## 2016-12-23 DIAGNOSIS — I2510 Atherosclerotic heart disease of native coronary artery without angina pectoris: Secondary | ICD-10-CM | POA: Insufficient documentation

## 2016-12-23 DIAGNOSIS — N393 Stress incontinence (female) (male): Secondary | ICD-10-CM | POA: Diagnosis not present

## 2016-12-23 DIAGNOSIS — I5181 Takotsubo syndrome: Secondary | ICD-10-CM | POA: Diagnosis not present

## 2016-12-23 DIAGNOSIS — I252 Old myocardial infarction: Secondary | ICD-10-CM | POA: Insufficient documentation

## 2016-12-23 DIAGNOSIS — R1012 Left upper quadrant pain: Secondary | ICD-10-CM | POA: Diagnosis not present

## 2016-12-23 DIAGNOSIS — R109 Unspecified abdominal pain: Secondary | ICD-10-CM | POA: Diagnosis present

## 2016-12-23 DIAGNOSIS — Z79899 Other long term (current) drug therapy: Secondary | ICD-10-CM | POA: Insufficient documentation

## 2016-12-23 DIAGNOSIS — F419 Anxiety disorder, unspecified: Secondary | ICD-10-CM | POA: Diagnosis not present

## 2016-12-23 HISTORY — PX: COLONOSCOPY WITH PROPOFOL: SHX5780

## 2016-12-23 HISTORY — DX: Anxiety disorder, unspecified: F41.9

## 2016-12-23 HISTORY — PX: ESOPHAGOGASTRODUODENOSCOPY (EGD) WITH PROPOFOL: SHX5813

## 2016-12-23 SURGERY — COLONOSCOPY WITH PROPOFOL
Anesthesia: General

## 2016-12-23 MED ORDER — LIDOCAINE HCL (PF) 2 % IJ SOLN
INTRAMUSCULAR | Status: DC | PRN
  Administered 2016-12-23: 50 mg via INTRADERMAL

## 2016-12-23 MED ORDER — PROPOFOL 500 MG/50ML IV EMUL
INTRAVENOUS | Status: AC
  Filled 2016-12-23: qty 50

## 2016-12-23 MED ORDER — PROPOFOL 10 MG/ML IV BOLUS
INTRAVENOUS | Status: DC | PRN
  Administered 2016-12-23: 50 mg via INTRAVENOUS
  Administered 2016-12-23 (×3): 20 mg via INTRAVENOUS

## 2016-12-23 MED ORDER — PROPOFOL 500 MG/50ML IV EMUL
INTRAVENOUS | Status: DC | PRN
  Administered 2016-12-23: 125 ug/kg/min via INTRAVENOUS

## 2016-12-23 MED ORDER — SODIUM CHLORIDE 0.9 % IV SOLN
INTRAVENOUS | Status: DC
  Administered 2016-12-23: 1000 mL via INTRAVENOUS

## 2016-12-23 MED ORDER — SODIUM CHLORIDE 0.9 % IV SOLN
INTRAVENOUS | Status: DC | PRN
  Administered 2016-12-23: 08:00:00 via INTRAVENOUS

## 2016-12-23 NOTE — Op Note (Addendum)
[facility] Gastroenterology Patient Name: Jenny Lee Procedure Date: 12/23/2016 8:52 AM MRN: [id number] Account #: [id number] Date of Birth: Feb 21, 1956 Admit Type: Ambulatory Age: 61 Room: [facility] ENDO ROOM 1 Gender: Female Note Status: Finalized Procedure:            Colonoscopy Indications:          Rectal bleeding Providers:            Jonathon Bellows MD, MD Referring MD:         No Local Md, MD (Referring MD) Medicines:            Monitored Anesthesia Care Complications:        No immediate complications. Procedure:            Pre-Anesthesia Assessment:                       - Prior to the procedure, a History and Physical was                        performed, and patient medications, allergies and                        sensitivities were reviewed. The patient's tolerance of                        previous anesthesia was reviewed.                       - The risks and benefits of the procedure and the                        sedation options and risks were discussed with the                        patient. All questions were answered and informed                        consent was obtained.                       - ASA Grade Assessment: II - A patient with mild                        systemic disease.                       After obtaining informed consent, the colonoscope was                        passed under direct vision. Throughout the procedure,                        the patient's blood pressure, pulse, and oxygen                        saturations were monitored continuously. The                        Colonoscope was introduced through the anus with the  intention of advancing to the cecum. The scope was                        advanced to the sigmoid colon before the procedure was                        aborted. Medications were given. The colonoscopy was                        performed with ease. The patient tolerated the                  procedure well. The quality of the bowel preparation                        was poor. Findings:      A large amount of solid stool was found in the sigmoid colon, making       visualization difficult. Impression:           - Preparation of the colon was poor.                       - Stool in the sigmoid colon.                       - No specimens collected. Recommendation:       - Discharge patient to home (with escort).                       - Resume previous diet.                       - Continue present medications.                       - Repeat colonoscopy in 4 weeks because the bowel                        preparation was suboptimal. Procedure Code(s):    --- Professional ---                       [phone], 53, Colonoscopy, flexible; diagnostic, including                        collection of specimen(s) by brushing or washing, when                        performed (separate procedure) Diagnosis Code(s):    --- Professional ---                       K62.5, Hemorrhage of anus and rectum CPT copyright 2016 American Medical Association. All rights reserved. The codes documented in this report are preliminary and upon coder review may  be revised to meet current compliance requirements. Jonathon Bellows, MD Jonathon Bellows MD, MD 12/23/2016 8:59:24 AM This report has been signed electronically. Number of Addenda: 0 Note Initiated On: 12/23/2016 8:52 AM Total Procedure Duration: 0 hours 1 minute 33 seconds       [facility]

## 2016-12-23 NOTE — Transfer of Care (Signed)
Immediate Anesthesia Transfer of Care Note  Patient: Jenny Lee  Procedure(s) Performed: Procedure(s): COLONOSCOPY WITH PROPOFOL (N/A) ESOPHAGOGASTRODUODENOSCOPY (EGD) WITH PROPOFOL (N/A)  Patient Location: Endoscopy Unit  Anesthesia Type:General  Level of Consciousness: awake and alert   Airway & Oxygen Therapy: Patient Spontanous Breathing and Patient connected to nasal cannula oxygen  Post-op Assessment: Report given to RN and Post -op Vital signs reviewed and stable  Post vital signs: Reviewed and stable  Last Vitals:  Vitals:   12/23/16 0748 12/23/16 0840  BP: (!) 149/73 (!) 103/48  Pulse: 70 70  Resp: 18 (!) 21  Temp: 36.4 C (!) 36.1 C    Last Pain:  Vitals:   12/23/16 0748  TempSrc: Tympanic         Complications: No apparent anesthesia complications

## 2016-12-23 NOTE — Anesthesia Post-op Follow-up Note (Signed)
Anesthesia QCDR form completed.        

## 2016-12-23 NOTE — Op Note (Addendum)
[facility] Gastroenterology Patient Name: Jenny Lee Procedure Date: 12/23/2016 8:20 AM MRN: [id number] Account #: [id number] Date of Birth: 23-Dec-1955 Admit Type: Ambulatory Age: 61 Room: [facility] ENDO ROOM 1 Gender: Female Note Status: Finalized Procedure:            Colonoscopy Indications:          Abdominal pain Providers:            Jonathon Bellows MD, MD Medicines:            Monitored Anesthesia Care Complications:        No immediate complications. Procedure:            Pre-Anesthesia Assessment:                       - Prior to the procedure, a History and Physical was                        performed, and patient medications, allergies and                        sensitivities were reviewed. The patient's tolerance of                        previous anesthesia was reviewed.                       - The risks and benefits of the procedure and the                        sedation options and risks were discussed with the                        patient. All questions were answered and informed                        consent was obtained.                       - ASA Grade Assessment: III - A patient with severe                        systemic disease.                       After obtaining informed consent, the colonoscope was                        passed under direct vision. Throughout the procedure,                        the patient's blood pressure, pulse, and oxygen                        saturations were monitored continuously. The                        Colonoscope was introduced through the anus and                        advanced to the the cecum, identified by the  appendiceal orifice, IC valve and transillumination.                        The colonoscopy was performed with ease. The patient                        tolerated the procedure well. The quality of the bowel                        preparation was good. Findings:  The perianal and digital rectal examinations were normal.      The entire examined colon appeared normal. Impression:           - The entire examined colon is normal.                       - No specimens collected. Recommendation:       - Discharge patient to home (with escort).                       - Advance diet as tolerated.                       - Continue present medications.                       - Return to GI office PRN. Procedure Code(s):    --- Professional ---                       [id number], Colonoscopy, flexible; diagnostic, including                        collection of specimen(s) by brushing or washing, when                        performed (separate procedure) Diagnosis Code(s):    --- Professional ---                       R10.9, Unspecified abdominal pain CPT copyright 2016 American Medical Association. All rights reserved. The codes documented in this report are preliminary and upon coder review may  be revised to meet current compliance requirements. Jonathon Bellows, MD Jonathon Bellows MD, MD 12/23/2016 8:38:11 AM This report has been signed electronically. Number of Addenda: 0 Note Initiated On: 12/23/2016 8:20 AM Scope Withdrawal Time: 0 hours 9 minutes 38 seconds  Total Procedure Duration: 0 hours 13 minutes 13 seconds       [facility]

## 2016-12-23 NOTE — Op Note (Addendum)
[facility] Gastroenterology Patient Name: Jenny Lee Procedure Date: 12/23/2016 8:20 AM MRN: [id number] Account #: [id number] Date of Birth: 07/11/1955 Admit Type: Ambulatory Age: 61 Room: [facility] ENDO ROOM 1 Gender: Female Note Status: Finalized THIS EXAM WAS SENT IN ERROR

## 2016-12-23 NOTE — H&P (Signed)
Jonathon Bellows MD [address], [place], [place] [address] Phone: [phone] Fax : [phone]  Primary Care Physician:  Casilda Carls, MD Primary Gastroenterologist:  Dr. Jonathon Bellows   Pre-Procedure History & Physical: HPI:  Jenny Lee is a 61 y.o. female is here for an endoscopy and colonoscopy.   Past Medical History:  Diagnosis Date  . Anxiety   . CAD (coronary artery disease)   . Female genuine stress incontinence 08/15/2014  . GERD (gastroesophageal reflux disease)   . H/O acute myocardial infarction 08/15/2014   Overview:  2002   . Heart attack ([place])    2001  . HLD (hyperlipidemia) 12/20/2015  . HTN (hypertension) 12/20/2015  . Hx of cardiac cath 12/11/2015   Overview:  Normal coronary arteries 2017  . Hypercholesteremia   . Hypertension   . Mastalgia 10/06/2012  . 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 Tachycardia 12/20/2015  . Takotsubo cardiomyopathy     Past Surgical History:  Procedure Laterality Date  . ABDOMINAL HYSTERECTOMY  2004  . CARDIAC CATHETERIZATION N/A 11/26/2015   Procedure: Left Heart Cath and Coronary Angiography;  Surgeon: Dionisio David, MD;  Location: [place];  Service: Cardiovascular;  Laterality: N/A;  . NECK SURGERY  1998    Prior to Admission medications   Medication Sig Start Date End Date Taking? Authorizing Provider  carvedilol (COREG) 12.5 MG tablet Take 12.5 mg by mouth 2 (two) times daily with a meal.   Yes [provider]  ALPRAZolam (XANAX) 0.5 MG tablet Take 0.5 mg by mouth at bedtime as needed for anxiety.    [provider]  aspirin 81 MG tablet Take 81 mg by mouth daily.    [provider]  enalapril (VASOTEC) 20 MG tablet Take 20 mg by mouth daily.    [provider]  estradiol (ESTRACE) 0.5 MG tablet Take 0.5 mg by mouth daily.    [provider]  Melatonin 5 MG TABS Take 5 mg by mouth daily.    [provider]  metoprolol  tartrate (LOPRESSOR) 25 MG tablet  09/18/16   [provider]  ondansetron (ZOFRAN) 4 MG tablet  12/01/16   [provider]  pantoprazole (PROTONIX) 40 MG tablet Take 1 tablet by mouth daily. 12/04/15   [provider]  sertraline (ZOLOFT) 100 MG tablet Take 1 tablet by mouth 1 day or 1 dose. 09/09/16   [provider]  simvastatin (ZOCOR) 20 MG tablet Take 20 mg by mouth daily at 6 PM.  10/10/15   [provider]  triamcinolone cream (KENALOG) 0.1 % Apply 1 application topically 1 day or 1 dose. 06/17/16   [provider]    Allergies as of 12/03/2016  . (No Known Allergies)    Family History  Problem Relation Age of Onset  . Breast cancer Sister 42    Social History   Social History  . Marital status: Married    Spouse name: N/A  . Number of children: N/A  . Years of education: N/A   Occupational History  . Not on file.   Social History Main Topics  . Smoking status: Current Some Day Smoker    Packs/day: 0.50    Years: 5.00  . Smokeless tobacco: Never Used  . Alcohol use No  . Drug use: No  . Sexual activity: Not on file   Other Topics Concern  . Not on file   Social History Narrative  . No narrative  on file    Review of Systems: See HPI, otherwise negative ROS  Physical Exam: BP (!) 149/73   Pulse 70   Temp 97.6 F (36.4 C) (Tympanic)   Resp 18   Ht 5\' 4"  (1.626 m)   Wt 164 lb (74.4 kg)   SpO2 100%   BMI 28.15 kg/m  General:   Alert,  pleasant and cooperative in NAD Head:  Normocephalic and atraumatic. Neck:  Supple; no masses or thyromegaly. Lungs:  Clear throughout to auscultation.    Heart:  Regular rate and rhythm. Abdomen:  Soft, nontender and nondistended. Normal bowel sounds, without guarding, and without rebound.   Neurologic:  Alert and  oriented x4;  grossly normal neurologically.  Impression/Plan: Jenny Lee is here for an endoscopy and colonoscopy to be performed for abdominal pain     Risks, benefits, limitations, and alternatives regarding  endoscopy and colonoscopy have been reviewed with the patient.  Questions have been answered.  All parties agreeable.   Jonathon Bellows, MD  12/23/2016, 7:57 AM

## 2016-12-23 NOTE — Anesthesia Postprocedure Evaluation (Signed)
Anesthesia Post Note  Patient: Jenny Lee  Procedure(s) Performed: Procedure(s) (LRB): COLONOSCOPY WITH PROPOFOL (N/A) ESOPHAGOGASTRODUODENOSCOPY (EGD) WITH PROPOFOL (N/A)  Patient location during evaluation: Endoscopy Anesthesia Type: General Level of consciousness: awake and alert and oriented Pain management: pain level controlled Vital Signs Assessment: post-procedure vital signs reviewed and stable Respiratory status: spontaneous breathing, nonlabored ventilation and respiratory function stable Cardiovascular status: blood pressure returned to baseline and stable Postop Assessment: no signs of nausea or vomiting Anesthetic complications: no     Last Vitals:  Vitals:   12/23/16 0840 12/23/16 0850  BP: (!) 103/48 (!) 110/59  Pulse: 70 (!) 59  Resp: (!) 21 14  Temp: (!) 36.1 C     Last Pain:  Vitals:   12/23/16 0748  TempSrc: Tympanic                 Velicia Dejager

## 2016-12-23 NOTE — Op Note (Addendum)
[facility] Gastroenterology Patient Name: Jenny Lee Procedure Date: 12/23/2016 8:20 AM MRN: [id number] Account #: [id number] Date of Birth: 1955-09-06 Admit Type: Ambulatory Age: 61 Room: [facility] ENDO ROOM 1 Gender: Female Note Status: Finalized Procedure:            Colonoscopy Indications:          Abdominal pain Providers:            Jonathon Bellows MD, MD Medicines:            Monitored Anesthesia Care Complications:        No immediate complications. Procedure:            Pre-Anesthesia Assessment:                       - Prior to the procedure, a History and Physical was                        performed, and patient medications, allergies and                        sensitivities were reviewed. The patient's tolerance of                        previous anesthesia was reviewed.                       - The risks and benefits of the procedure and the                        sedation options and risks were discussed with the                        patient. All questions were answered and informed                        consent was obtained.                       - ASA Grade Assessment: III - A patient with severe                        systemic disease.                       After obtaining informed consent, the colonoscope was                        passed under direct vision. Throughout the procedure,                        the patient's blood pressure, pulse, and oxygen                        saturations were monitored continuously. The                        Colonoscope was introduced through the and advanced to                        the the cecum, identified by the appendiceal orifice,  IC valve and transillumination. The colonoscopy was                        performed with ease. The patient tolerated the                        procedure well. The quality of the bowel preparation                        was good. Findings:      The  perianal and digital rectal examinations were normal.      The entire examined colon appeared normal. Impression:           - The entire examined colon is normal.                       - No specimens collected. Recommendation:       - Discharge patient to home (with escort).                       - Advance diet as tolerated.                       - Continue present medications.                       - Return to GI office PRN. Procedure Code(s):    --- Professional ---                       [phone], Colonoscopy, flexible; diagnostic, including                        collection of specimen(s) by brushing or washing, when                        performed (separate procedure) Diagnosis Code(s):    --- Professional ---                       R10.9, Unspecified abdominal pain CPT copyright 2016 American Medical Association. All rights reserved. The codes documented in this report are preliminary and upon coder review may  be revised to meet current compliance requirements. Jonathon Bellows, MD Jonathon Bellows MD, MD 12/23/2016 8:38:11 AM This report has been signed electronically. Number of Addenda: 0 Note Initiated On: 12/23/2016 8:20 AM Scope Withdrawal Time: 0 hours 9 minutes 38 seconds  Total Procedure Duration: 0 hours 13 minutes 13 seconds       [facility]

## 2016-12-23 NOTE — Anesthesia Preprocedure Evaluation (Signed)
Anesthesia Evaluation  Patient identified by MRN, date of birth, ID band Patient awake    Reviewed: Allergy & Precautions, NPO status , Patient's Chart, lab work & pertinent test results  History of Anesthesia Complications Negative for: history of anesthetic complications  Airway Mallampati: III  TM Distance: >3 FB Neck ROM: Full    Dental no notable dental hx.    Pulmonary neg sleep apnea, neg COPD, Current Smoker,    breath sounds clear to auscultation- rhonchi (-) wheezing      Cardiovascular hypertension, Pt. on medications (-) CAD, (-) Past MI, (-) Cardiac Stents and (-) CABG  Rhythm:Regular Rate:Normal - Systolic murmurs and - Diastolic murmurs Takosubo cardiomyopathy, EF 35%   Neuro/Psych Anxiety negative neurological ROS     GI/Hepatic Neg liver ROS, GERD  ,  Endo/Other  negative endocrine ROSneg diabetes  Renal/GU negative Renal ROS     Musculoskeletal negative musculoskeletal ROS (+)   Abdominal (+) - obese,   Peds  Hematology negative hematology ROS (+)   Anesthesia Other Findings Past Medical History: No date: Anxiety No date: CAD (coronary artery disease) 08/15/2014: Female genuine stress incontinence No date: GERD (gastroesophageal reflux disease) 08/15/2014: H/O acute myocardial infarction     Comment:  Overview:  2002  No date: Heart attack [facility])     Comment:  2001 12/20/2015: HLD (hyperlipidemia) 12/20/2015: HTN (hypertension) 12/11/2015: Hx of cardiac cath     Comment:  Overview:  Normal coronary arteries 2017 No date: Hypercholesteremia No date: Hypertension 10/06/2012: Mastalgia 08/15/2014: Prolapse of vaginal vault after hysterectomy 12/20/2015: Tachycardia No date: Takotsubo cardiomyopathy   Reproductive/Obstetrics                             Anesthesia Physical Anesthesia Plan  ASA: III  Anesthesia Plan: General   Post-op Pain Management:    Induction:  Intravenous  PONV Risk Score and Plan: 1 and Propofol infusion  Airway Management Planned: Natural Airway  Additional Equipment:   Intra-op Plan:   Post-operative Plan:   Informed Consent: I have reviewed the patients History and Physical, chart, labs and discussed the procedure including the risks, benefits and alternatives for the proposed anesthesia with the patient or authorized representative who has indicated his/her understanding and acceptance.   Dental advisory given  Plan Discussed with: CRNA and Anesthesiologist  Anesthesia Plan Comments:         Anesthesia Quick Evaluation

## 2016-12-23 NOTE — Op Note (Addendum)
[facility] Gastroenterology Patient Name: Jenny Lee Procedure Date: 12/23/2016 8:09 AM MRN: [id number] Account #: [id number] Date of Birth: 10/01/1955 Admit Type: Outpatient Age: 61 Room: [facility] ENDO ROOM 1 Gender: Female Note Status: Finalized Procedure:            Upper GI endoscopy Indications:          Abdominal pain Providers:            Jonathon Bellows MD, MD Referring MD:         Casilda Carls, MD (Referring MD) Medicines:            Monitored Anesthesia Care Complications:        No immediate complications. Procedure:            Pre-Anesthesia Assessment:                       - Prior to the procedure, a History and Physical was                        performed, and patient medications, allergies and                        sensitivities were reviewed. The patient's tolerance of                        previous anesthesia was reviewed.                       - The risks and benefits of the procedure and the                        sedation options and risks were discussed with the                        patient. All questions were answered and informed                        consent was obtained.                       - ASA Grade Assessment: III - A patient with severe                        systemic disease.                       After obtaining informed consent, the endoscope was                        passed under direct vision. Throughout the procedure,                        the patient's blood pressure, pulse, and oxygen                        saturations were monitored continuously. The                        Colonoscope was introduced through the mouth, and  advanced to the third part of duodenum. The upper GI                        endoscopy was accomplished with ease. The patient                        tolerated the procedure well. Findings:      The examined duodenum was normal.      The esophagus was normal.      Normal  mucosa was found in the entire examined stomach. Biopsies were       taken with a cold forceps for histology.      The exam was otherwise without abnormality. Impression:           - Normal examined duodenum.                       - Normal esophagus.                       - Normal mucosa was found in the entire stomach.                        Biopsied.                       - The examination was otherwise normal. Recommendation:       - Await pathology results.                       - Perform a colonoscopy today. Procedure Code(s):    --- Professional ---                       [phone], Esophagogastroduodenoscopy, flexible, transoral;                        with biopsy, single or multiple Diagnosis Code(s):    --- Professional ---                       R10.9, Unspecified abdominal pain CPT copyright 2016 American Medical Association. All rights reserved. The codes documented in this report are preliminary and upon coder review may  be revised to meet current compliance requirements. Jonathon Bellows, MD Jonathon Bellows MD, MD 12/23/2016 8:20:32 AM This report has been signed electronically. Number of Addenda: 0 Note Initiated On: 12/23/2016 8:09 AM      [facility]

## 2016-12-24 ENCOUNTER — Encounter: Payer: Self-pay | Admitting: Gastroenterology

## 2016-12-24 LAB — SURGICAL PATHOLOGY

## 2016-12-26 ENCOUNTER — Other Ambulatory Visit: Payer: Self-pay

## 2016-12-26 ENCOUNTER — Telehealth: Payer: Self-pay

## 2016-12-26 ENCOUNTER — Ambulatory Visit: Payer: BLUE CROSS/BLUE SHIELD

## 2016-12-26 ENCOUNTER — Ambulatory Visit
Admission: RE | Admit: 2016-12-26 | Discharge: 2016-12-26 | Disposition: A | Discharge status: Home / Self Care | Payer: BLUE CROSS/BLUE SHIELD | Source: Ambulatory Visit | Attending: Internal Medicine | Admitting: Internal Medicine

## 2016-12-26 DIAGNOSIS — R938 Abnormal findings on diagnostic imaging of other specified body structures: Secondary | ICD-10-CM | POA: Diagnosis not present

## 2016-12-26 DIAGNOSIS — I10 Essential (primary) hypertension: Secondary | ICD-10-CM | POA: Diagnosis present

## 2016-12-26 DIAGNOSIS — A048 Other specified bacterial intestinal infections: Secondary | ICD-10-CM

## 2016-12-26 MED ORDER — CLARITHROMYCIN 500 MG PO TABS: 500.0000 mg | ORAL_TABLET | Freq: Two times a day (BID) | ORAL | 0 refills | Status: AC

## 2016-12-26 MED ORDER — OMEPRAZOLE 20 MG PO CPDR: 20.0000 mg | DELAYED_RELEASE_CAPSULE | Freq: Two times a day (BID) | ORAL | 0 refills | Status: DC

## 2016-12-26 MED ORDER — AMOXICILLIN 500 MG PO TABS: 1000.0000 mg | ORAL_TABLET | Freq: Two times a day (BID) | ORAL | 0 refills | Status: AC

## 2016-12-26 NOTE — Telephone Encounter (Signed)
Advised patient of results per Dr. Vicente Males.   Ordered Rx for H. Pylori infection.   Explained medications to patient. Advised to callback after completion of meds for re-check labs.

## 2017-01-12 ENCOUNTER — Telehealth: Payer: Self-pay

## 2017-01-12 NOTE — Telephone Encounter (Signed)
Advised patient of re-check lab for H.Pylori.  Patient states Dr. Humphrey Rolls was doing testing at their office. She will carry sample there and relay results.

## 2017-01-21 ENCOUNTER — Telehealth: Payer: Self-pay

## 2017-01-21 NOTE — Telephone Encounter (Signed)
-----   Message from Leontine Locket, [place] sent at 12/26/2016  9:01 AM EDT ----- Regarding: Re-Check Labs Re-check labs for H. Pylori stool antigen.  4 month EGD for gastric intestinal metaplasia with gastric mapping

## 2017-02-02 ENCOUNTER — Other Ambulatory Visit: Payer: Self-pay | Admitting: Internal Medicine

## 2017-02-02 DIAGNOSIS — K839 Disease of biliary tract, unspecified: Secondary | ICD-10-CM

## 2017-02-17 ENCOUNTER — Encounter
Admission: RE | Admit: 2017-02-17 | Discharge: 2017-02-17 | Disposition: A | Discharge status: Home / Self Care | Payer: BLUE CROSS/BLUE SHIELD | Source: Ambulatory Visit | Attending: Internal Medicine | Admitting: Internal Medicine

## 2017-02-17 DIAGNOSIS — K839 Disease of biliary tract, unspecified: Secondary | ICD-10-CM | POA: Insufficient documentation

## 2017-02-17 MED ORDER — TECHNETIUM TC 99M MEBROFENIN IV KIT
5.0000 | PACK | Freq: Once | INTRAVENOUS | Status: AC | PRN
  Administered 2017-02-17: 5.22 via INTRAVENOUS

## 2017-02-24 ENCOUNTER — Encounter (INDEPENDENT_AMBULATORY_CARE_PROVIDER_SITE_OTHER): Payer: Self-pay

## 2017-02-24 ENCOUNTER — Encounter (INDEPENDENT_AMBULATORY_CARE_PROVIDER_SITE_OTHER): Payer: Self-pay | Admitting: Vascular Surgery

## 2017-02-24 ENCOUNTER — Ambulatory Visit (INDEPENDENT_AMBULATORY_CARE_PROVIDER_SITE_OTHER): Payer: BLUE CROSS/BLUE SHIELD | Admitting: Vascular Surgery

## 2017-02-24 VITALS — BP 122/72 | HR 70 | Resp 16 | Ht 65.0 in | Wt 166.0 lb

## 2017-02-24 DIAGNOSIS — I5181 Takotsubo syndrome: Secondary | ICD-10-CM

## 2017-02-24 DIAGNOSIS — E785 Hyperlipidemia, unspecified: Secondary | ICD-10-CM | POA: Diagnosis not present

## 2017-02-24 DIAGNOSIS — I701 Atherosclerosis of renal artery: Secondary | ICD-10-CM | POA: Diagnosis not present

## 2017-02-24 DIAGNOSIS — I150 Renovascular hypertension: Secondary | ICD-10-CM

## 2017-02-24 NOTE — Progress Notes (Signed)
Patient ID: Jenny Lee, female   DOB: 1956/03/30, 61 y.o.   MRN: [id number]  Chief Complaint  Patient presents with  . New Patient (Initial Visit)    Renal Consult    HPI Jenny Lee is a 61 y.o. female.  I am asked to see the patient by Dr. Rennis Harding for evaluation of renal artery disease.  The patient reports Years of high blood pressure which have gotten worse despite appropriate medical therapy over the past few years. Although her blood pressure is good today, she says it normally runs in the 140s and 150s despite medications. She has an extensive history of heart disease going back into her 55s. She's had multiple previous cardiac catheterization procedures. As far she knows, her renal function remains preserved. She has not been on dialysis and any point. The patient denies trauma or injury. She does have some occasional Lee in her lower back area and wonders if this is related to her kidney artery disease. A renal artery duplex was recently performed which showed elevated velocities particularly in the right renal artery worrisome for renal artery stenosis although the official report discussed the elevated velocities in the aorta as well which makes the ratios difficult to interpret. There is at least concern for renal artery stenosis although the study was certainly not 151% conclusive for renal artery stenosis. Given this finding, she is referred for further evaluation and treatment   Past Medical History:  Diagnosis Date  . Anxiety   . CAD (coronary artery disease)   . Female genuine stress incontinence 08/15/2014  . GERD (gastroesophageal reflux disease)   . H/O acute myocardial infarction 08/15/2014   Overview:  2002   . Heart attack ([place])    2001  . HLD (hyperlipidemia) 12/20/2015  . HTN (hypertension) 12/20/2015  . Hx of cardiac cath 12/11/2015   Overview:  Normal coronary arteries 2017  . Hypercholesteremia   . Hypertension   . Mastalgia 10/06/2012  . Prolapse  of vaginal vault after hysterectomy 08/15/2014  . Tachycardia 12/20/2015  . Takotsubo cardiomyopathy     Past Surgical History:  Procedure Laterality Date  . ABDOMINAL HYSTERECTOMY  2004  . CARDIAC CATHETERIZATION N/A 11/26/2015   Procedure: Left Heart Cath and Coronary Angiography;  Surgeon: Dionisio David, MD;  Location: [facility];  Service: Cardiovascular;  Laterality: N/A;  . COLONOSCOPY WITH PROPOFOL N/A 12/23/2016   Procedure: COLONOSCOPY WITH PROPOFOL;  Surgeon: Jonathon Bellows, MD;  Location: [facility] ENDOSCOPY;  Service: Endoscopy;  Laterality: N/A;  . ESOPHAGOGASTRODUODENOSCOPY (EGD) WITH PROPOFOL N/A 12/23/2016   Procedure: ESOPHAGOGASTRODUODENOSCOPY (EGD) WITH PROPOFOL;  Surgeon: Jonathon Bellows, MD;  Location[facility] ENDOSCOPY;  Service: Endoscopy;  Laterality: N/A;  . NECK SURGERY  1998    Family History  Problem Relation Age of Onset  . Breast cancer Sister 51  No bleeding disorders, clotting disorders, autoimmune diseases, or aneurysms  Social History Social History  Substance Use Topics  . Smoking status: Former Smoker    Packs/day: 0.00    Years: 0.00  . Smokeless tobacco: Never Used  . 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 ALPRAZolam (XANAX) 0.5 MG tablet Take 0.5 mg by mouth at bedtime as needed for anxiety.    Marland Kitchen aspirin 81 MG tablet Take 81 mg by mouth daily.    . carvedilol (COREG) 12.5 MG tablet Take 12.5 mg  by mouth 2 (two) times daily with a meal.    . chlorthalidone (HYGROTON) 25 MG tablet Take 25 mg by mouth daily.    . enalapril (VASOTEC) 20 MG tablet Take 20 mg by mouth daily.    Marland Kitchen estradiol (ESTRACE) 0.5 MG tablet Take 0.5 mg by mouth daily.    . Melatonin 5 MG TABS Take 5 mg by mouth daily.    . metoprolol tartrate (LOPRESSOR) 25 MG tablet   0  . ondansetron (ZOFRAN) 4 MG tablet   0  . pantoprazole (PROTONIX) 40 MG tablet Take 1 tablet by mouth  daily.  1  . sertraline (ZOLOFT) 100 MG tablet Take 1 tablet by mouth 1 day or 1 dose.  10  . simvastatin (ZOCOR) 20 MG tablet Take 20 mg by mouth daily at 6 PM.   4  . triamcinolone cream (KENALOG) 0.1 % Apply 1 application topically 1 day or 1 dose.  0  . dicyclomine (BENTYL) 10 MG capsule   2  . lansoprazole (PREVACID) 30 MG capsule   0  . omeprazole (PRILOSEC) 20 MG capsule Take 1 capsule (20 mg total) by mouth 2 (two) times daily. 28 capsule 0   No current facility-administered medications for this visit.       REVIEW OF SYSTEMS (Negative unless checked)  Constitutional: [] Weight loss  [] Fever  [] Chills Cardiac: [] Chest Lee   [] Chest pressure   [x] Palpitations   [] Shortness of breath when laying flat   [] Shortness of breath at rest   [x] Shortness of breath with exertion. Vascular:  [] Lee in legs with walking   [] Lee in legs at rest   [] Lee in legs when laying flat   [] Claudication   [] Lee in feet when walking  [] Lee in feet at rest  [] Lee in feet when laying flat   [] History of DVT   [] Phlebitis   [x] Swelling in legs   [] Varicose veins   [] Non-healing ulcers Pulmonary:   [] Uses home oxygen   [] Productive cough   [] Hemoptysis   [] Wheeze  [] COPD   [] Asthma Neurologic:  [] Dizziness  [] Blackouts   [] Seizures   [] History of stroke   [] History of TIA  [] Aphasia   [] Temporary blindness   [] Dysphagia   [] Weakness or numbness in arms   [] Weakness or numbness in legs Musculoskeletal:  [x] Arthritis   [] Joint swelling   [] Joint Lee   [x] Low back Lee Hematologic:  [] Easy bruising  [] Easy bleeding   [] Hypercoagulable state   [] Anemic  [] Hepatitis Gastrointestinal:  [] Blood in stool   [] Vomiting blood  [x] Gastroesophageal reflux/heartburn   [] Abdominal Lee Genitourinary:  [] Chronic kidney disease   [] Difficult urination  [] Frequent urination  [] Burning with urination   [] Hematuria Skin:  [] Rashes   [] Ulcers   [] Wounds Psychological:  [] History of anxiety   []  History of major  depression.    Physical Exam BP 122/72 (BP Location: Right Arm)   Pulse 70   Resp 16   Ht 5\' 5"  (8.341 m)   Wt 75.3 kg (166 lb)   BMI 27.62 kg/m  Gen:  WD/WN, NAD Head: [place]/AT, No temporalis wasting.  Ear/Nose/Throat: Hearing grossly intact, nares w/o erythema or drainage, oropharynx w/o Erythema/Exudate Eyes: Conjunctiva clear, sclera non-icteric  Neck: trachea midline.  No bruit or JVD.  Pulmonary:  Good air movement, clear to auscultation bilaterally.  Cardiac: RRR, normal S1, S2, no Murmurs, rubs or gallops. Vascular:  Vessel Right Left  Radial Palpable Palpable  Ulnar Palpable Palpable  Brachial Palpable Palpable  Carotid Palpable, without  bruit Palpable, without bruit  Aorta Not palpable N/A  Femoral Palpable Palpable  Popliteal Palpable Palpable  PT Palpable Palpable  DP Palpable Palpable   Gastrointestinal: soft, non-tender/non-distended. No guarding/reflex. No bruits Musculoskeletal: M/S 5/5 throughout.  Extremities without ischemic changes.  No deformity or atrophy.  Neurologic: Sensation grossly intact in extremities.  Symmetrical.  Speech is fluent. Motor exam as listed above. Psychiatric: Judgment intact, Mood & affect appropriate for pt's clinical situation. Dermatologic: No rashes or ulcers noted.  No cellulitis or open wounds.    Radiology Nm Hepato W/eject Fract  Result Date: 02/17/2017 CLINICAL DATA:  Upper abdominal Lee and nausea.  Reflux. EXAM: NUCLEAR MEDICINE HEPATOBILIARY IMAGING WITH GALLBLADDER EF TECHNIQUE: Sequential images of the abdomen were obtained out to 60 minutes following intravenous administration of radiopharmaceutical. After oral ingestion of 8 ounces of Half-and-Half cream, gallbladder ejection fraction was determined. RADIOPHARMACEUTICALS:  5.2 mCi Tc-11m Choletec IV COMPARISON:  Abdominal ultrasound 02/10/2017 FINDINGS: Satisfactory uptake of radiopharmaceutical from the blood pool. Biliary activity at 6 minutes. Gallbladder activity  at 12 minutes along with bowel activity. Calculated gallbladder ejection fraction is 78%. (Normal gallbladder ejection fraction with half-and-half is greater than 33%.) IMPRESSION: 1. Normal hepatobiliary scan, with gallbladder ejection fraction of 78%. Electronically Signed   By: Van Clines M.D.   On: 02/17/2017 12:22    Labs Recent Results (from the past 2160 hour(s))  Surgical pathology     Status: None   Collection Time: 12/23/16  8:17 AM  Result Value Ref Range   SURGICAL PATHOLOGY      Surgical Pathology CASE: [id number] PATIENT: [facility] Surgical Pathology Report     SPECIMEN SUBMITTED: A. Stomach, eval.  H pylori; cbxs  CLINICAL HISTORY: None provided  PRE-OPERATIVE DIAGNOSIS: LUQ abdominal Lee R10.12 nausea R11.0  POST-OPERATIVE DIAGNOSIS: Normal EGD, normal colon     DIAGNOSIS: A. STOMACH; COLD BIOPSY: - HELICOBACTER PYLORI-ASSOCIATED GASTRITIS WITH MODERATE CHRONIC ACTIVE INFLAMMATION. - FOCAL INTESTINAL METAPLASIA. - NEGATIVE FOR DYSPLASIA AND MALIGNANCY. - H. PYLORI BACTERIA IDENTIFIED IN HEMATOXYLIN AND EOSIN SECTIONS.   GROSS DESCRIPTION: A. Labeled: gastric C BX evaluate for H. pylori Tissue fragment(s): 4 Size: 0.2-0.3 cm Description: pink to tan fragments  Entirely submitted in one cassette(s).  Final Diagnosis performed by Bryan Lemma, MD.  Electronically signed 12/24/2016 6:09:01PM    The electronic signature indicates that the named Attending Pathologist has evaluated the specimen  Technical compon ent performed at [facility], [address], [place], [place] [address] Lab: [phone] Dir: Darrick Penna. Evette Doffing, MD  Professional component performed at [facility], [facility], [place], [place], [place] [address] Lab: [phone] Dir: Dellia Nims. Rubinas, MD      Assessment/Plan:  Takotsubo cardiomyopathy Follows with cardiology  HLD (hyperlipidemia) lipid control important in reducing the  progression of atherosclerotic disease. Continue statin therapy   Renovascular hypertension Blood pressure control is suboptimal despite 3 medications making a secondary cause of her hypertension much more likely.  Renal artery stenosis (HCC) A renal artery duplex was recently performed which showed elevated velocities particularly in the right renal artery worrisome for renal artery stenosis although the official report discussed the elevated velocities in the aorta as well which makes the ratios difficult to interpret. There is at least concern for renal artery stenosis although the study was certainly not 761% conclusive for renal artery stenosis. We had a long discussion today regarding treatment options for renal artery stenosis and the reason and rationale for treatment. In a patient with suboptimal blood pressure  control on multiple medications, consideration for intervention is generally given. I have discussed that CT angiogram and MR angiogram are other diagnostic tools that further evaluate her renal arteries, but in general these are somewhat limited. She has already had a CT scan which showed abnormalities in the renal arteries as well although I do not have that for review currently. As such, I would recommend a renal angiogram for further evaluation as well as potential treatment if significant disease is encountered. I discussed the risks and benefits of the procedure. The patient voices her understanding and desires to proceed.      Jenny Lee 02/24/2017, 2:05 PM   This note was created with Dragon medical transcription system.  Any errors from dictation are unintentional.

## 2017-02-24 NOTE — Patient Instructions (Signed)
Renal Artery Stenosis Renal artery stenosis (RAS) is narrowing of the artery that carries blood to your kidneys. It can affect one or both kidneys. Your kidneys filter waste and extra fluid from your blood. You get rid of the waste and fluid when you urinate. Your kidneys also make an important chemical messenger (hormone) called renin. Renin helps regulate your blood pressure. The first sign of RAS may be high blood pressure. Over time, other symptoms can develop. What are the causes? Plaque buildup in your arteries (atherosclerosis) is the main cause of RAS. The plaques that cause this are made up of:  Fat.  Cholesterol.  Calcium.  Other substances.  As these substances build up in your renal artery, this slows the blood supply to your kidneys. The lack of blood and oxygen causes the signs and symptoms of RAS. A much less common cause of RAS is a disease called fibromuscular dysplasia. This disease causes abnormal cell growth that narrows the renal artery. It is not related to atherosclerosis. It occurs mostly in women who are 25-50 years old. It may be passed down through families. What increases the risk? You may be at risk for renal artery stenosis if you:  Are a man who is at least 59 years old.  Are a woman who is at least 59 years old.  Have high blood pressure.  Have high cholesterol.  Are a smoker.  Abuse alcohol.  Have diabetes or prediabetes.  Are overweight.  Have a family history of early heart disease.  What are the signs or symptoms? RAS usually develops slowly. You may not have any signs or symptoms at first. The earliest signs may be:  Developing high blood pressure.  A sudden increase in existing high blood pressure.  No longer responding to medicine that used to control your blood pressure.  Later signs and symptoms are due to kidney damage. They may include:  Fatigue.  Shortness of breath.  Swollen legs and feet.  Dry  skin.  Headaches.  Muscle cramps.  Loss of appetite.  Nausea or vomiting.  How is this diagnosed? Your health care provider may suspect RAS based on changes in your blood pressure and your risk factors. A physical exam will be done. Your health care provider may use a stethoscope to listen for a whooshing sound (bruit) that can occur where the renal artery is blocking blood flow. Several tests may be done to confirm a diagnosis of RAS. These may include:  Blood and urine tests to check your kidney function.  Imaging tests of your kidneys, such as: ? A test that involves using sound waves to create an image of your kidneys and the blood flow to your kidneys (ultrasound). ? A test in which dye is injected into one of your blood vessels so images can be taken as the dye flows through your renal arteries (angiogram). These tests can be done using X-rays, a CT scan (computed tomography angiogram, CTA), or a type of MRI (magnetic resonance angiogram, MRA).  How is this treated? Making lifestyle changes to reduce your risk factors is the first treatment option for early RAS. If the blood flow to one of your kidneys is cut by more than half, you may need medicine to:  Lower your blood pressure. This is the main medical treatment for RAS. You may need more than one type of medicine for this. The two types that work best for RAS are: ? ACE inhibitors. ? Angiotensin receptor blockers.  Reduce fluid   in the body (diuretics).  Lower your cholesterol (statins).  If medicine is not enough to control RAS, you may need surgery. This may involve:  Threading a tube with an inflatable balloon into the renal artery to force it open (angioplasty).  Removing plaque from inside the artery (endarterectomy).  Follow these instructions at home:  Take medicines only as directed by your health care provider.  Make any lifestyle changes recommended by your health care provider. This may include: ? Working  with a dietitian to maintain a heart-healthy diet. This type of diet is low in saturated fat, salt, and added sugar. ? Starting an exercise program as directed by your health care provider. ? Maintaining a healthy weight. ? Quitting smoking. ? Not abusing alcohol.  Keep all follow-up visits as directed by your health care provider. This is important. Contact a health care provider if:  Your symptoms of RAS are not getting better.  Your symptoms are changing or getting worse. Get help right away if:  You have very bad pain in your back or abdomen.  You have blood in your urine. This information is not intended to replace advice given to you by your health care provider. Make sure you discuss any questions you have with your health care provider. Document Released: 02/05/2005 Document Revised: 10/18/2015 Document Reviewed: 08/25/2013 Elsevier Interactive Patient Education  2018 Elsevier Inc.  

## 2017-02-24 NOTE — Assessment & Plan Note (Signed)
Follows with cardiology 

## 2017-02-24 NOTE — Assessment & Plan Note (Signed)
Blood pressure control is suboptimal despite 3 medications making a secondary cause of her hypertension much more likely.

## 2017-02-24 NOTE — Assessment & Plan Note (Signed)
lipid control important in reducing the progression of atherosclerotic disease. Continue statin therapy  

## 2017-02-24 NOTE — Assessment & Plan Note (Signed)
A renal artery duplex was recently performed which showed elevated velocities particularly in the right renal artery worrisome for renal artery stenosis although the official report discussed the elevated velocities in the aorta as well which makes the ratios difficult to interpret. There is at least concern for renal artery stenosis although the study was certainly not 062% conclusive for renal artery stenosis. We had a long discussion today regarding treatment options for renal artery stenosis and the reason and rationale for treatment. In a patient with suboptimal blood pressure control on multiple medications, consideration for intervention is generally given. I have discussed that CT angiogram and MR angiogram are other diagnostic tools that further evaluate her renal arteries, but in general these are somewhat limited. She has already had a CT scan which showed abnormalities in the renal arteries as well although I do not have that for review currently. As such, I would recommend a renal angiogram for further evaluation as well as potential treatment if significant disease is encountered. I discussed the risks and benefits of the procedure. The patient voices her understanding and desires to proceed.

## 2017-02-26 ENCOUNTER — Other Ambulatory Visit (INDEPENDENT_AMBULATORY_CARE_PROVIDER_SITE_OTHER): Payer: Self-pay | Admitting: Vascular Surgery

## 2017-03-02 ENCOUNTER — Ambulatory Visit
Admission: RE | Admit: 2017-03-02 | Discharge: 2017-03-02 | Disposition: A | Discharge status: Home / Self Care | Payer: BLUE CROSS/BLUE SHIELD | Source: Ambulatory Visit | Attending: Vascular Surgery | Admitting: Vascular Surgery

## 2017-03-02 ENCOUNTER — Encounter
Admission: RE | Disposition: A | Discharge status: Home / Self Care | Payer: Self-pay | Source: Ambulatory Visit | Attending: Vascular Surgery

## 2017-03-02 DIAGNOSIS — I252 Old myocardial infarction: Secondary | ICD-10-CM | POA: Diagnosis not present

## 2017-03-02 DIAGNOSIS — I5181 Takotsubo syndrome: Secondary | ICD-10-CM | POA: Insufficient documentation

## 2017-03-02 DIAGNOSIS — Z90710 Acquired absence of both cervix and uterus: Secondary | ICD-10-CM | POA: Diagnosis not present

## 2017-03-02 DIAGNOSIS — Z7982 Long term (current) use of aspirin: Secondary | ICD-10-CM | POA: Insufficient documentation

## 2017-03-02 DIAGNOSIS — Z9889 Other specified postprocedural states: Secondary | ICD-10-CM | POA: Insufficient documentation

## 2017-03-02 DIAGNOSIS — I2510 Atherosclerotic heart disease of native coronary artery without angina pectoris: Secondary | ICD-10-CM | POA: Insufficient documentation

## 2017-03-02 DIAGNOSIS — I150 Renovascular hypertension: Secondary | ICD-10-CM | POA: Diagnosis present

## 2017-03-02 DIAGNOSIS — E785 Hyperlipidemia, unspecified: Secondary | ICD-10-CM | POA: Diagnosis not present

## 2017-03-02 DIAGNOSIS — Z87891 Personal history of nicotine dependence: Secondary | ICD-10-CM | POA: Diagnosis not present

## 2017-03-02 DIAGNOSIS — I701 Atherosclerosis of renal artery: Secondary | ICD-10-CM | POA: Diagnosis not present

## 2017-03-02 DIAGNOSIS — Z803 Family history of malignant neoplasm of breast: Secondary | ICD-10-CM | POA: Diagnosis not present

## 2017-03-02 DIAGNOSIS — N393 Stress incontinence (female) (male): Secondary | ICD-10-CM | POA: Diagnosis not present

## 2017-03-02 DIAGNOSIS — F419 Anxiety disorder, unspecified: Secondary | ICD-10-CM | POA: Insufficient documentation

## 2017-03-02 DIAGNOSIS — K219 Gastro-esophageal reflux disease without esophagitis: Secondary | ICD-10-CM | POA: Insufficient documentation

## 2017-03-02 HISTORY — PX: RENAL ANGIOGRAPHY: CATH118260

## 2017-03-02 LAB — CREATININE, SERUM
CREATININE: 0.85 mg/dL (ref 0.44–1.00)
GFR calc Af Amer: >60 mL/min (ref >60)
GFR calc non Af Amer: >60 mL/min (ref >60)

## 2017-03-02 LAB — BUN: BUN: 15 mg/dL (ref 6–20)

## 2017-03-02 SURGERY — RENAL ANGIOGRAPHY
Anesthesia: Moderate Sedation | Laterality: Bilateral

## 2017-03-02 MED ORDER — HEPARIN SODIUM (PORCINE) 1000 UNIT/ML IJ SOLN
INTRAMUSCULAR | Status: AC
  Filled 2017-03-02: qty 1

## 2017-03-02 MED ORDER — SODIUM CHLORIDE 0.9% FLUSH: 3.0000 mL | INTRAVENOUS | Status: DC | PRN

## 2017-03-02 MED ORDER — HYDROMORPHONE HCL 1 MG/ML IJ SOLN: 1.0000 mg | Freq: Once | INTRAMUSCULAR | Status: DC | PRN

## 2017-03-02 MED ORDER — ONDANSETRON HCL 4 MG/2ML IJ SOLN: 4.0000 mg | Freq: Four times a day (QID) | INTRAMUSCULAR | Status: DC | PRN

## 2017-03-02 MED ORDER — HYDRALAZINE HCL 20 MG/ML IJ SOLN: 5.0000 mg | INTRAMUSCULAR | Status: DC | PRN

## 2017-03-02 MED ORDER — CEFAZOLIN SODIUM-DEXTROSE 2-4 GM/100ML-% IV SOLN
2.0000 g | Freq: Once | INTRAVENOUS | Status: AC
  Administered 2017-03-02: 2 g via INTRAVENOUS

## 2017-03-02 MED ORDER — HEPARIN SODIUM (PORCINE) 1000 UNIT/ML IJ SOLN
INTRAMUSCULAR | Status: DC | PRN
  Administered 2017-03-02: 4000 [IU] via INTRAVENOUS

## 2017-03-02 MED ORDER — SODIUM CHLORIDE 0.9 % IV SOLN: INTRAVENOUS | Status: DC

## 2017-03-02 MED ORDER — LIDOCAINE-EPINEPHRINE (PF) 1 %-1:200000 IJ SOLN
INTRAMUSCULAR | Status: AC
  Filled 2017-03-02: qty 30

## 2017-03-02 MED ORDER — FENTANYL CITRATE (PF) 100 MCG/2ML IJ SOLN
INTRAMUSCULAR | Status: DC | PRN
  Administered 2017-03-02 (×2): 25 ug via INTRAVENOUS
  Administered 2017-03-02: 50 ug via INTRAVENOUS

## 2017-03-02 MED ORDER — FAMOTIDINE 20 MG PO TABS: 40.0000 mg | ORAL_TABLET | ORAL | Status: DC | PRN

## 2017-03-02 MED ORDER — SODIUM CHLORIDE 0.9 % IV SOLN
INTRAVENOUS | Status: DC
  Administered 2017-03-02: 11:00:00 via INTRAVENOUS

## 2017-03-02 MED ORDER — SODIUM CHLORIDE 0.9% FLUSH: 3.0000 mL | Freq: Two times a day (BID) | INTRAVENOUS | Status: DC

## 2017-03-02 MED ORDER — SODIUM CHLORIDE 0.9 % IV SOLN: 250.0000 mL | INTRAVENOUS | Status: DC | PRN

## 2017-03-02 MED ORDER — HEPARIN (PORCINE) IN NACL 2-0.9 UNIT/ML-% IJ SOLN
INTRAMUSCULAR | Status: AC
  Filled 2017-03-02: qty 1000

## 2017-03-02 MED ORDER — FENTANYL CITRATE (PF) 100 MCG/2ML IJ SOLN
INTRAMUSCULAR | Status: AC
  Filled 2017-03-02: qty 2

## 2017-03-02 MED ORDER — IOPAMIDOL (ISOVUE-300) INJECTION 61%
INTRAVENOUS | Status: DC | PRN
  Administered 2017-03-02: 60 mL via INTRAVENOUS

## 2017-03-02 MED ORDER — MIDAZOLAM HCL 2 MG/2ML IJ SOLN
INTRAMUSCULAR | Status: DC | PRN
  Administered 2017-03-02: 2 mg via INTRAVENOUS
  Administered 2017-03-02: 1 mg via INTRAVENOUS
  Administered 2017-03-02: 2 mg via INTRAVENOUS

## 2017-03-02 MED ORDER — METHYLPREDNISOLONE SODIUM SUCC 125 MG IJ SOLR: 125.0000 mg | INTRAMUSCULAR | Status: DC | PRN

## 2017-03-02 MED ORDER — LABETALOL HCL 5 MG/ML IV SOLN: 10.0000 mg | INTRAVENOUS | Status: DC | PRN

## 2017-03-02 MED ORDER — MIDAZOLAM HCL 5 MG/5ML IJ SOLN
INTRAMUSCULAR | Status: AC
  Filled 2017-03-02: qty 5

## 2017-03-02 SURGICAL SUPPLY — 12 items
BALLN ULTRVRSE 018 5X40X150 (BALLOONS) ×3
BALLOON ULTRVRSE 018 5X40X150 (BALLOONS) IMPLANT
CATH BEACON 5 .035 65 C2 TIP (CATHETERS) ×2 IMPLANT
CATH PIG 70CM (CATHETERS) ×2 IMPLANT
DEVICE PRESTO INFLATION (MISCELLANEOUS) ×2 IMPLANT
DEVICE STARCLOSE SE CLOSURE (Vascular Products) ×2 IMPLANT
GLIDEWIRE STIFF .35X180X3 HYDR (WIRE) ×2 IMPLANT
PACK ANGIOGRAPHY (CUSTOM PROCEDURE TRAY) ×2 IMPLANT
SHEATH BRITE TIP 5FRX11 (SHEATH) ×2 IMPLANT
VALVE HEMO TOUHY BORST Y (VALVE) ×2 IMPLANT
WIRE G V18X300CM (WIRE) ×2 IMPLANT
WIRE J 3MM .035X145CM (WIRE) ×2 IMPLANT

## 2017-03-02 NOTE — Op Note (Signed)
[facility] Percutaneous Study/Intervention Procedural Note    Surgeon(s): [employer]  Assistants: none  Pre-operative Diagnosis: Renal artery stenosis, renovascular hypertension  Post-operative diagnosis: Same  Procedure(s) Performed: 1. Ultrasound guidance for vascular access right femoral artery 2. Catheter placement into bilateral renal arteries from right femoral approach 3. Aortogram and selective bilateral renal angiogram  4. Percutaneous transluminal angioplasty with a 5 mm diameter by 4 cm length to the right renal artery 5. StarClose closure device right femoral artery  Contrast: 60 cc  EBL: 10 cc  Fluroro Time: 4.9 minutes  Moderate Conscious sedation time: Approximately 35 minutes using 5 mg of Versed and 100 mcg of Fentanyl  Indications: The patient is a 61 y.o.female with worsening severe hypertension despite multiple medications. The patient has suboptimal blood pressure control despite multiple antihypertensives and a CT scan suspecting renal artery stenosis.  Given the clinical scenario and the noninvasive findings, angiogram is indicated for further evaluation of her renal artery and potential treatment. Risks and benefits are discussed and informed consent is obtained.  Procedure: The patient was identified and appropriate procedural time out was performed. The patient was then placed supine on the table and prepped and draped in the usual sterile fashion.Moderate conscious sedation was administered with a face to face encounter with the patient throughout the procedure with my supervision of the RN administering medicines and monitoring the patients vital signs and mental status throughout from the start of the procedure until the patient was taken to the recovery room. Ultrasound was used to evaluate the right common femoral artery. It was patent . A digital  ultrasound image was acquired. A Seldinger needle was used to access the right common femoral artery under direct ultrasound guidance and a permanent image was performed. A 0.035 J wire was advanced without resistance and a 5Fr sheath was placed. Pigtail catheter was placed into the aorta at the L1 level and an AP aortogram was performed. This demonstrated no significant aortoiliac artery stenosis. The celiac and SMA appeared to have reasonable flow. The origin of the left renal artery was not well seen as it was overshadowed by the visceral vessels. There was a good left nephrogram. The right renal artery appeared irregular with a beaded appearance worrisome for fibromuscular dysplasia in the main renal artery and there was a small lower pole right renal artery. The patient was then systemically heparinized with 4000 units of intravenous heparin. I used a C2 catheter to cannulate the left renal artery and selective imaging was performed. Interestingly, the catheter and flow predominantly went up a large left adrenal branch and her left adrenal gland appeared to be markedly enlarged. With the catheter pulled back at the origin of the left renal artery, left renal artery angiogram was performed which showed a mild stenosis of the left renal artery of less than 30% with a good nephrogram. There may have been some very subtle changes of fibromuscular dysplasia without narrowing. I then turned my attention to the right renal artery. This was cannulated with C2 catheter without difficulty. Selective imaging was then performed This confirmed a greater than 60% stenosis of the proximal renal artery with disease process being fibromuscular dysplasia extending a couple of centimeters into the main renal artery..  At this point I selected the V 18 wire and crossed the lesion without difficulty parking the wire well into renal artery branches beyond the obvious disease.   I then selected a 5 mm diameter x 4 mm length  balloon and brought this across the lesion within the main right renal artery extending back into the aorta at.  This was inflated encompassing the lesion with its proximal extent going back into the aorta and the distal extent being in the distal main renal artery.  This was inflated to 6 ATM and the waist resolved.  Completion angiogram showed resolution with no significant stenosis and much more subtle appearance to any fibromuscular dysplasia changes.   The guide catheter was removed. Oblique arteriogram was performed of the right femoral artery and StarClose closure device was deployed in the usual fashion with excellent hemostatic result. The patient was taken to the recovery room in stable condition having tolerated the procedure well.  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 Very subtle changes on the left with no significant stenosis of more than 20 or 30%. A large left adrenal branch with an enlarged left adrenal gland were also identified. Aorta was widely patent without stenosis.   Condition:  Stable  Complications: None   Jenny Lee 03/02/2017 12:32 PM   This note was created with Dragon Medical transcription system. Any errors in dictation are purely unintentional.

## 2017-03-02 NOTE — Progress Notes (Addendum)
Pt sitting up in bed, eating and drinking w/o difficulty, in NAD at this time.  Family friend at bedside.  Discharge and follow up instructions reviewed with pt.  Pt verbalizes understanding.  Dr. Lucky Cowboy at bedside earlier to discuss procedure and follow up with pt and husband.

## 2017-03-02 NOTE — Discharge Instructions (Signed)
Angiogram, Care After °This sheet gives you information about how to care for yourself after your procedure. Your health care provider may also give you more specific instructions. If you have problems or questions, contact your health care provider. °What can I expect after the procedure? °After the procedure, it is common to have bruising and tenderness at the catheter insertion area. °Follow these instructions at home: °Insertion site care °· Follow instructions from your health care provider about how to take care of your insertion site. Make sure you: °? Wash your hands with soap and water before you change your bandage (dressing). If soap and water are not available, use hand sanitizer. °? Change your dressing as told by your health care provider. °? Leave stitches (sutures), skin glue, or adhesive strips in place. These skin closures may need to stay in place for 2 weeks or longer. If adhesive strip edges start to loosen and curl up, you may trim the loose edges. Do not remove adhesive strips completely unless your health care provider tells you to do that. °· Do not take baths, swim, or use a hot tub until your health care provider approves. °· You may shower 24-48 hours after the procedure or as told by your health care provider. °? Gently wash the site with plain soap and water. °? Pat the area dry with a clean towel. °? Do not rub the site. This may cause bleeding. °· Do not apply powder or lotion to the site. Keep the site clean and dry. °· Check your insertion site every day for signs of infection. Check for: °? Redness, swelling, or pain. °? Fluid or blood. °? Warmth. °? Pus or a bad smell. °Activity °· Rest as told by your health care provider, usually for 1-2 days. °· Do not lift anything that is heavier than 10 lbs. (4.5 kg) or as told by your health care provider. °· Do not drive for 24 hours if you were given a medicine to help you relax (sedative). °· Do not drive or use heavy machinery while  taking prescription pain medicine. °General instructions °· Return to your normal activities as told by your health care provider, usually in about a week. Ask your health care provider what activities are safe for you. °· If the catheter site starts bleeding, lie flat and put pressure on the site. If the bleeding does not stop, get help right away. This is a medical emergency. °· Drink enough fluid to keep your urine clear or pale yellow. This helps flush the contrast dye from your body. °· Take over-the-counter and prescription medicines only as told by your health care provider. °· Keep all follow-up visits as told by your health care provider. This is important. °Contact a health care provider if: °· You have a fever or chills. °· You have redness, swelling, or pain around your insertion site. °· You have fluid or blood coming from your insertion site. °· The insertion site feels warm to the touch. °· You have pus or a bad smell coming from your insertion site. °· You have bruising around the insertion site. °· You notice blood collecting in the tissue around the catheter site (hematoma). The hematoma may be painful to the touch. °Get help right away if: °· You have severe pain at the catheter insertion area. °· The catheter insertion area swells very fast. °· The catheter insertion area is bleeding, and the bleeding does not stop when you hold steady pressure on the area. °·   The area near or just beyond the catheter insertion site becomes pale, cool, tingly, or numb. These symptoms may represent a serious problem that is an emergency. Do not wait to see if the symptoms will go away. Get medical help right away. Call your local emergency services (911 in the [place] Do not drive yourself to the hospital. Summary  After the procedure, it is common to have bruising and tenderness at the catheter insertion area.  After the procedure, it is important to rest and drink plenty of fluids.  Do not take baths,  swim, or use a hot tub until your health care provider says it is okay to do so. You may shower 24-48 hours after the procedure or as told by your health care provider.  If the catheter site starts bleeding, lie flat and put pressure on the site. If the bleeding does not stop, get help right away. This is a medical emergency. This information is not intended to replace advice given to you by your health care provider. Make sure you discuss any questions you have with your health care provider. Document Released: 11/28/2004 Document Revised: 04/16/2016 Document Reviewed: 04/16/2016 Elsevier Interactive Patient Education  2017 [employer]. Femoral Site Care Refer to this sheet in the next few weeks. These instructions provide you with information about caring for yourself after your procedure. Your health care provider may also give you more specific instructions. Your treatment has been planned according to current medical practices, but problems sometimes occur. Call your health care provider if you have any problems or questions after your procedure. What can I expect after the procedure? After your procedure, it is typical to have the following:  Bruising at the site that usually fades within 1-2 weeks.  Blood collecting in the tissue (hematoma) that may be painful to the touch. It should usually decrease in size and tenderness within 1-2 weeks.  Follow these instructions at home:  Take medicines only as directed by your health care provider.  You may shower 24-48 hours after the procedure or as directed by your health care provider. Remove the bandage (dressing) and gently wash the site with plain soap and water. Pat the area dry with a clean towel. Do not rub the site, because this may cause bleeding.  Do not take baths, swim, or use a hot tub until your health care provider approves.  Check your insertion site every day for redness, swelling, or drainage.  Do not apply powder or  lotion to the site.  Limit use of stairs to twice a day for the first 2-3 days or as directed by your health care provider.  Do not squat for the first 2-3 days or as directed by your health care provider.  Do not lift over 10 lb (4.5 kg) for 5 days after your procedure or as directed by your health care provider.  Ask your health care provider when it is okay to: ? Return to work or school. ? Resume usual physical activities or sports. ? Resume sexual activity.  Do not drive home if you are discharged the same day as the procedure. Have someone else drive you.  You may drive 24 hours after the procedure unless otherwise instructed by your health care provider.  Do not operate machinery or power tools for 24 hours after the procedure or as directed by your health care provider.  If your procedure was done as an outpatient procedure, which means that you went home the same day as  your procedure, a responsible adult should be with you for the first 24 hours after you arrive home.  Keep all follow-up visits as directed by your health care provider. This is important. Contact a health care provider if:  You have a fever.  You have chills.  You have increased bleeding from the site. Hold pressure on the site. Get help right away if:  You have unusual pain at the site.  You have redness, warmth, or swelling at the site.  You have drainage (other than a small amount of blood on the dressing) from the site.  The site is bleeding, and the bleeding does not stop after 30 minutes of holding steady pressure on the site.  Your leg or foot becomes pale, cool, tingly, or numb. This information is not intended to replace advice given to you by your health care provider. Make sure you discuss any questions you have with your health care provider. Document Released: 01/13/2014 Document Revised: 10/18/2015 Document Reviewed: 11/29/2013 Elsevier Interactive Patient Education  2018 [employer]. Moderate Conscious Sedation, Adult, Care After These instructions provide you with information about caring for yourself after your procedure. Your health care provider may also give you more specific instructions. Your treatment has been planned according to current medical practices, but problems sometimes occur. Call your health care provider if you have any problems or questions after your procedure. What can I expect after the procedure? After your procedure, it is common:  To feel sleepy for several hours.  To feel clumsy and have poor balance for several hours.  To have poor judgment for several hours.  To vomit if you eat too soon.  Follow these instructions at home: For at least 24 hours after the procedure:   Do not: ? Participate in activities where you could fall or become injured. ? Drive. ? Use heavy machinery. ? Drink alcohol. ? Take sleeping pills or medicines that cause drowsiness. ? Make important decisions or sign legal documents. ? Take care of children on your own.  Rest. Eating and drinking  Follow the diet recommended by your health care provider.  If you vomit: ? Drink water, juice, or soup when you can drink without vomiting. ? Make sure you have little or no nausea before eating solid foods. General instructions  Have a responsible adult stay with you until you are awake and alert.  Take over-the-counter and prescription medicines only as told by your health care provider.  If you smoke, do not smoke without supervision.  Keep all follow-up visits as told by your health care provider. This is important. Contact a health care provider if:  You keep feeling nauseous or you keep vomiting.  You feel light-headed.  You develop a rash.  You have a fever. Get help right away if:  You have trouble breathing. This information is not intended to replace advice given to you by your health care provider. Make sure you discuss any questions  you have with your health care provider. Document Released: 03/02/2013 Document Revised: 10/15/2015 Document Reviewed: 09/01/2015 Elsevier Interactive Patient Education  [employer].

## 2017-03-02 NOTE — H&P (Signed)
[facility] History & Physical Update  The patient was interviewed and re-examined.  The patient's previous History and Physical has been reviewed and is unchanged.  There is no change in the plan of care. We plan to proceed with the scheduled procedure.  Leotis Pain, MD  03/02/2017, 11:31 AM

## 2017-03-03 ENCOUNTER — Encounter: Payer: Self-pay | Admitting: Vascular Surgery

## 2017-03-04 ENCOUNTER — Encounter (INDEPENDENT_AMBULATORY_CARE_PROVIDER_SITE_OTHER): Payer: Self-pay

## 2017-03-13 ENCOUNTER — Other Ambulatory Visit: Payer: Self-pay | Admitting: Internal Medicine

## 2017-03-13 DIAGNOSIS — D3500 Benign neoplasm of unspecified adrenal gland: Secondary | ICD-10-CM

## 2017-03-19 ENCOUNTER — Other Ambulatory Visit: Payer: Self-pay

## 2017-03-19 DIAGNOSIS — K3189 Other diseases of stomach and duodenum: Secondary | ICD-10-CM

## 2017-03-19 DIAGNOSIS — K31A0 Gastric intestinal metaplasia, unspecified: Secondary | ICD-10-CM

## 2017-03-20 ENCOUNTER — Ambulatory Visit
Admission: RE | Admit: 2017-03-20 | Discharge: 2017-03-20 | Disposition: A | Discharge status: Home / Self Care | Payer: BLUE CROSS/BLUE SHIELD | Source: Ambulatory Visit | Attending: Internal Medicine | Admitting: Internal Medicine

## 2017-03-20 DIAGNOSIS — D3500 Benign neoplasm of unspecified adrenal gland: Secondary | ICD-10-CM | POA: Diagnosis not present

## 2017-03-20 MED ORDER — GADOBENATE DIMEGLUMINE 529 MG/ML IV SOLN
15.0000 mL | Freq: Once | INTRAVENOUS | Status: AC | PRN
  Administered 2017-03-20: 15 mL via INTRAVENOUS

## 2017-03-30 ENCOUNTER — Encounter: Payer: Self-pay | Admitting: *Deleted

## 2017-03-31 ENCOUNTER — Encounter (INDEPENDENT_AMBULATORY_CARE_PROVIDER_SITE_OTHER): Payer: Self-pay

## 2017-03-31 ENCOUNTER — Ambulatory Visit
Admission: RE | Admit: 2017-03-31 | Discharge: 2017-03-31 | Disposition: A | Discharge status: Home / Self Care | Payer: BLUE CROSS/BLUE SHIELD | Source: Ambulatory Visit | Attending: Gastroenterology | Admitting: Gastroenterology

## 2017-03-31 ENCOUNTER — Ambulatory Visit: Payer: BLUE CROSS/BLUE SHIELD | Admitting: Anesthesiology

## 2017-03-31 ENCOUNTER — Encounter
Admission: RE | Disposition: A | Discharge status: Home / Self Care | Payer: Self-pay | Source: Ambulatory Visit | Attending: Gastroenterology

## 2017-03-31 DIAGNOSIS — B9681 Helicobacter pylori [H. pylori] as the cause of diseases classified elsewhere: Secondary | ICD-10-CM | POA: Insufficient documentation

## 2017-03-31 DIAGNOSIS — K2950 Unspecified chronic gastritis without bleeding: Secondary | ICD-10-CM | POA: Insufficient documentation

## 2017-03-31 DIAGNOSIS — I10 Essential (primary) hypertension: Secondary | ICD-10-CM | POA: Diagnosis not present

## 2017-03-31 DIAGNOSIS — I2510 Atherosclerotic heart disease of native coronary artery without angina pectoris: Secondary | ICD-10-CM | POA: Insufficient documentation

## 2017-03-31 DIAGNOSIS — I739 Peripheral vascular disease, unspecified: Secondary | ICD-10-CM | POA: Insufficient documentation

## 2017-03-31 DIAGNOSIS — K3189 Other diseases of stomach and duodenum: Secondary | ICD-10-CM | POA: Diagnosis not present

## 2017-03-31 DIAGNOSIS — K219 Gastro-esophageal reflux disease without esophagitis: Secondary | ICD-10-CM | POA: Insufficient documentation

## 2017-03-31 DIAGNOSIS — Z7982 Long term (current) use of aspirin: Secondary | ICD-10-CM | POA: Diagnosis not present

## 2017-03-31 DIAGNOSIS — Z79899 Other long term (current) drug therapy: Secondary | ICD-10-CM | POA: Insufficient documentation

## 2017-03-31 DIAGNOSIS — F419 Anxiety disorder, unspecified: Secondary | ICD-10-CM | POA: Diagnosis not present

## 2017-03-31 DIAGNOSIS — E785 Hyperlipidemia, unspecified: Secondary | ICD-10-CM | POA: Insufficient documentation

## 2017-03-31 DIAGNOSIS — Z87891 Personal history of nicotine dependence: Secondary | ICD-10-CM | POA: Insufficient documentation

## 2017-03-31 DIAGNOSIS — I252 Old myocardial infarction: Secondary | ICD-10-CM | POA: Diagnosis not present

## 2017-03-31 DIAGNOSIS — K31A0 Gastric intestinal metaplasia, unspecified: Secondary | ICD-10-CM

## 2017-03-31 HISTORY — PX: ESOPHAGOGASTRODUODENOSCOPY (EGD) WITH PROPOFOL: SHX5813

## 2017-03-31 SURGERY — ESOPHAGOGASTRODUODENOSCOPY (EGD) WITH PROPOFOL
Anesthesia: General

## 2017-03-31 MED ORDER — SODIUM CHLORIDE 0.9 % IV SOLN
INTRAVENOUS | Status: DC | PRN
  Administered 2017-03-31: 10:00:00 via INTRAVENOUS

## 2017-03-31 MED ORDER — FENTANYL CITRATE (PF) 100 MCG/2ML IJ SOLN
INTRAMUSCULAR | Status: AC
  Filled 2017-03-31: qty 2

## 2017-03-31 MED ORDER — LIDOCAINE 2% (20 MG/ML) 5 ML SYRINGE
INTRAMUSCULAR | Status: DC | PRN
  Administered 2017-03-31: 40 mg via INTRAVENOUS

## 2017-03-31 MED ORDER — FENTANYL CITRATE (PF) 100 MCG/2ML IJ SOLN
INTRAMUSCULAR | Status: DC | PRN
  Administered 2017-03-31 (×2): 50 ug via INTRAVENOUS

## 2017-03-31 MED ORDER — SODIUM CHLORIDE 0.9 % IV SOLN
INTRAVENOUS | Status: DC
  Administered 2017-03-31: 10:00:00 via INTRAVENOUS

## 2017-03-31 MED ORDER — PROPOFOL 10 MG/ML IV BOLUS
INTRAVENOUS | Status: DC | PRN
  Administered 2017-03-31: 100 mg via INTRAVENOUS

## 2017-03-31 MED ORDER — PROPOFOL 500 MG/50ML IV EMUL
INTRAVENOUS | Status: DC | PRN
  Administered 2017-03-31: 160 ug/kg/min via INTRAVENOUS

## 2017-03-31 MED ORDER — PHENYLEPHRINE HCL 10 MG/ML IJ SOLN
INTRAMUSCULAR | Status: DC | PRN
  Administered 2017-03-31: 100 ug via INTRAVENOUS

## 2017-03-31 NOTE — Anesthesia Postprocedure Evaluation (Signed)
Anesthesia Post Note  Patient: Jenny Lee  Procedure(s) Performed: ESOPHAGOGASTRODUODENOSCOPY (EGD) WITH PROPOFOL WITH GASTRIC MAPPING (N/A )  Patient location during evaluation: Endoscopy Anesthesia Type: General Level of consciousness: awake and alert Pain management: pain level controlled Vital Signs Assessment: post-procedure vital signs reviewed and stable Respiratory status: spontaneous breathing, nonlabored ventilation, respiratory function stable and patient connected to nasal cannula oxygen Cardiovascular status: blood pressure returned to baseline and stable Postop Assessment: no apparent nausea or vomiting Anesthetic complications: no     Last Vitals:  Vitals:   03/31/17 1040 03/31/17 1050  BP: 105/61 97/86  Pulse: (!) 57 (!) 53  Resp: 11 12  Temp:    SpO2: 100% 99%    Last Pain:  Vitals:   03/31/17 1020  TempSrc: Tympanic                 Jenny Lee

## 2017-03-31 NOTE — Anesthesia Post-op Follow-up Note (Signed)
Anesthesia QCDR form completed.        

## 2017-03-31 NOTE — Anesthesia Preprocedure Evaluation (Signed)
Anesthesia Evaluation  Patient identified by MRN, date of birth, ID band Patient awake    Reviewed: Allergy & Precautions, H&P , NPO status , Patient's Chart, lab work & pertinent test results  History of Anesthesia Complications Negative for: history of anesthetic complications  Airway Mallampati: II  TM Distance: >3 FB Neck ROM: full    Dental  (+) Chipped, Poor Dentition, Missing, Caps   Pulmonary neg shortness of breath, former smoker,           Cardiovascular Exercise Tolerance: Good hypertension, (-) angina+ CAD, + Past MI and + Peripheral Vascular Disease  (-) DOE      Neuro/Psych PSYCHIATRIC DISORDERS Anxiety negative neurological ROS     GI/Hepatic Neg liver ROS, GERD  Medicated and Controlled,  Endo/Other  negative endocrine ROS  Renal/GU Renal disease  negative genitourinary   Musculoskeletal   Abdominal   Peds  Hematology negative hematology ROS (+)   Anesthesia Other Findings Past Medical History: No date: Anxiety No date: CAD (coronary artery disease) 08/15/2014: Female genuine stress incontinence No date: GERD (gastroesophageal reflux disease) 08/15/2014: H/O acute myocardial infarction     Comment:  Overview:  2002  No date: Heart attack [facility])     Comment:  2001 12/20/2015: HLD (hyperlipidemia) 12/20/2015: HTN (hypertension) 12/11/2015: Hx of cardiac cath     Comment:  Overview:  Normal coronary arteries 2017 No date: Hypercholesteremia No date: Hypertension 10/06/2012: Mastalgia 08/15/2014: Prolapse of vaginal vault after hysterectomy 12/20/2015: Tachycardia No date: Takotsubo cardiomyopathy  Past Surgical History: 2004: ABDOMINAL HYSTERECTOMY 1998: NECK SURGERY     Reproductive/Obstetrics negative OB ROS                             Anesthesia Physical Anesthesia Plan  ASA: III  Anesthesia Plan: General   Post-op Pain Management:    Induction:  Intravenous  PONV Risk Score and Plan: 3 and Propofol infusion  Airway Management Planned: Natural Airway and Nasal Cannula  Additional Equipment:   Intra-op Plan:   Post-operative Plan:   Informed Consent: I have reviewed the patients History and Physical, chart, labs and discussed the procedure including the risks, benefits and alternatives for the proposed anesthesia with the patient or authorized representative who has indicated his/her understanding and acceptance.   Dental Advisory Given  Plan Discussed with: Anesthesiologist, CRNA and Surgeon  Anesthesia Plan Comments: (Patient consented for risks of anesthesia including but not limited to:  - adverse reactions to medications - risk of intubation if required - damage to teeth, lips or other oral mucosa - sore throat or hoarseness - Damage to heart, brain, lungs or loss of life  Patient voiced understanding.)        Anesthesia Quick Evaluation

## 2017-03-31 NOTE — H&P (Signed)
Jonathon Bellows, MD [address], [place], [place], [place], [address] [address] [place], [place], [place], [address] Phone: [phone]  Fax: [phone]  Primary Care Physician:  Perrin Maltese, MD   Pre-Procedure History & Physical: HPI:  Jenny Lee is a 61 y.o. female is here for an endoscopy    Past Medical History:  Diagnosis Date  . Anxiety   . CAD (coronary artery disease)   . Female genuine stress incontinence 08/15/2014  . GERD (gastroesophageal reflux disease)   . H/O acute myocardial infarction 08/15/2014   Overview:  2002   . Heart attack ([place])    2001  . HLD (hyperlipidemia) 12/20/2015  . HTN (hypertension) 12/20/2015  . Hx of cardiac cath 12/11/2015   Overview:  Normal coronary arteries 2017  . Hypercholesteremia   . Hypertension   . Mastalgia 10/06/2012  . 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 Tachycardia 12/20/2015  . Takotsubo cardiomyopathy     Past Surgical History:  Procedure Laterality Date  . ABDOMINAL HYSTERECTOMY  2004  . [place]    Prior to Admission medications   Medication Sig Start Date End Date Taking? Authorizing Provider  ALPRAZolam Duanne Moron) 0.5 MG tablet Take 0.5 mg by mouth daily as needed for anxiety.     [provider]  aspirin 81 MG tablet Take 81 mg by mouth daily.    [provider]  carvedilol (COREG) 12.5 MG tablet Take 12.5 mg by mouth 2 (two) times daily with a meal.    [provider]  chlorthalidone (HYGROTON) 25 MG tablet Take 12.5 mg by mouth every morning.     [provider]  enalapril (VASOTEC) 20 MG tablet Take 20 mg by mouth 2 (two) times daily.     [provider]  estradiol (ESTRACE) 0.5 MG tablet Take 0.5 mg by mouth daily.    [provider]  Melatonin 10 MG TABS Take 10 mg by mouth at bedtime.     [provider]  ondansetron (ZOFRAN) 4 MG tablet Take 4 mg by mouth every 8 (eight) hours as needed for nausea.   12/01/16   [provider]  pantoprazole (PROTONIX) 40 MG tablet Take 1 tablet by mouth every morning.  12/04/15   [provider]  sertraline (ZOLOFT) 100 MG tablet Take 1 tablet by mouth daily.  09/09/16   [provider]  simvastatin (ZOCOR) 20 MG tablet Take 20 mg by mouth every evening.  10/10/15   [provider]    Allergies as of 03/19/2017  . (No Known Allergies)    Family History  Problem Relation Age of Onset  . Breast cancer Sister 13    Social History   Socioeconomic History  . Marital status: Married    Spouse name: Not on file  . Number of children: Not on file  . Years of education: Not on file  . Highest education level: Not on file  Social Needs  . Financial resource strain: Not on file  . Food insecurity - worry: Not on file  . Food insecurity - inability: Not on file  . Transportation needs - medical: Not on file  . Transportation needs - non-medical: Not on file  Occupational History  . Not on file  Tobacco Use  . Smoking status: Former Smoker    Packs/day: 0.00    Years: 0.00    Pack years: 0.00  . Smokeless tobacco: Never Used  Substance and Sexual Activity  . Alcohol use: No    Alcohol/week: 0.0 oz  . Drug use: No  . Sexual activity: Not on file  Other Topics Concern  . Not on file  Social History Narrative  . Not on file    Review of Systems: See HPI, otherwise negative ROS  Physical Exam: There were no vitals taken for this visit. General:   Alert,  pleasant and cooperative in NAD Head:  Normocephalic and atraumatic. Neck:  Supple; no masses or thyromegaly. Lungs:  Clear throughout to auscultation, normal respiratory effort.    Heart:  +S1, +S2, Regular rate and rhythm, No edema. Abdomen:  Soft, nontender and nondistended. Normal bowel sounds, without guarding, and without rebound.   Neurologic:  Alert and  oriented x4;  grossly normal neurologically.  Impression/Plan: Jessee Avers is here for  an colonoscopy to be performed for an endoscopy being performed for evaluation of gastric mapping    Risks, benefits, limitations, and alternatives regarding endoscopy have been reviewed with the patient.  Questions have been answered.  All parties agreeable.   Jonathon Bellows, MD  03/31/2017, 9:31 AM

## 2017-03-31 NOTE — Transfer of Care (Signed)
Immediate Anesthesia Transfer of Care Note  Patient: Jenny Lee  Procedure(s) Performed: ESOPHAGOGASTRODUODENOSCOPY (EGD) WITH PROPOFOL WITH GASTRIC MAPPING (N/A )  Patient Location: PACU and Endoscopy Unit  Anesthesia Type:General  Level of Consciousness: drowsy  Airway & Oxygen Therapy: Patient Spontanous Breathing and Patient connected to nasal cannula oxygen  Post-op Assessment: Report given to RN and Post -op Vital signs reviewed and stable  Post vital signs: Reviewed and stable  Last Vitals:  Vitals:   03/31/17 0956  BP: 119/72  Pulse: 61  Resp: 16  Temp: (!) 36.2 C  SpO2: 98%    Last Pain:  Vitals:   03/31/17 0956  TempSrc: Oral         Complications: No apparent anesthesia complications

## 2017-03-31 NOTE — Op Note (Signed)
[facility] Patient Name: Jenny Lee Procedure Date: 03/31/2017 9:43 AM MRN: [id number] Account #: [id number] Date of Birth: 11-Oct-1955 Admit Type: Outpatient Age: 61 Room: [facility] ENDO ROOM 1 Gender: Female Note Status: Finalized Procedure:            Upper GI endoscopy Indications:          Intestinal metaplasia, Follow-up of intestinal                        metaplasia Providers:            Jonathon Bellows MD, MD Referring MD:         Perrin Maltese, MD (Referring MD) Medicines:            Monitored Anesthesia Care Complications:        No immediate complications. Procedure:            Pre-Anesthesia Assessment:                       - Prior to the procedure, a History and Physical was                        performed, and patient medications, allergies and                        sensitivities were reviewed. The patient's tolerance of                        previous anesthesia was reviewed.                       - The risks and benefits of the procedure and the                        sedation options and risks were discussed with the                        patient. All questions were answered and informed                        consent was obtained.                       - ASA Grade Assessment: III - A patient with severe                        systemic disease.                       After obtaining informed consent, the endoscope was                        passed under direct vision. Throughout the procedure,                        the patient's blood pressure, pulse, and oxygen                        saturations were monitored continuously. The Endoscope                        was  introduced through the mouth, and advanced to the                        third part of duodenum. The upper GI endoscopy was                        accomplished with ease. The patient tolerated the                        procedure well. Findings:      The examined  duodenum was normal.      The esophagus was normal.      Localized moderate inflammation characterized by erosions, erythema and       aphthous ulcerations was found on the lesser curvature of the stomach       and in the prepyloric region of the stomach. Biopsies were taken with a       cold forceps for histology.      Normal mucosa was found on the greater curvature of the stomach and at       the incisura. Biopsies were taken with a cold forceps for histology. Impression:           - Normal examined duodenum.                       - Normal esophagus.                       - Gastritis. Biopsied.                       - Normal mucosa was found in the greater curvature and                        in the incisura. Biopsied. Recommendation:       - Discharge patient to home (with escort).                       - Resume previous diet.                       - Continue present medications.                       - Await pathology results.                       - Return to my office in 3 months. Procedure Code(s):    --- Professional ---                       [phone], Esophagogastroduodenoscopy, flexible, transoral;                        with biopsy, single or multiple Diagnosis Code(s):    --- Professional ---                       K29.70, Gastritis, unspecified, without bleeding                       K31.89, Other diseases of stomach and duodenum CPT copyright 2016 American Medical Association. All rights reserved. The codes documented in this report are preliminary and  upon coder review may  be revised to meet current compliance requirements. Jonathon Bellows, MD Jonathon Bellows MD, MD 03/31/2017 10:17:41 AM This report has been signed electronically. Number of Addenda: 0 Note Initiated On: 03/31/2017 9:43 AM      [facility]

## 2017-04-01 ENCOUNTER — Encounter: Payer: Self-pay | Admitting: Gastroenterology

## 2017-04-01 LAB — SURGICAL PATHOLOGY

## 2017-04-02 ENCOUNTER — Other Ambulatory Visit: Payer: Self-pay

## 2017-04-02 MED ORDER — BIS SUBCIT-METRONID-TETRACYC 140-125-125 MG PO CAPS: 3.0000 | ORAL_CAPSULE | Freq: Three times a day (TID) | ORAL | 0 refills | Status: DC

## 2017-04-02 NOTE — Progress Notes (Signed)
Contacted pt and advised of H Pylori infection.   Called in [place] [facility] to [facility] per pt instruction.   Advised pt of results per Dr. Vicente Males.   1. Still has H pylori- commence on Pylera for 2 weeks, check for eradication after  2. Repeat EGD with gastric mapping in 2 years

## 2017-04-06 ENCOUNTER — Other Ambulatory Visit: Payer: Self-pay

## 2017-04-06 ENCOUNTER — Other Ambulatory Visit (INDEPENDENT_AMBULATORY_CARE_PROVIDER_SITE_OTHER): Payer: Self-pay | Admitting: Vascular Surgery

## 2017-04-06 DIAGNOSIS — I701 Atherosclerosis of renal artery: Secondary | ICD-10-CM

## 2017-04-06 MED ORDER — TETRACYCLINE HCL 500 MG PO CAPS: 500.0000 mg | ORAL_CAPSULE | Freq: Four times a day (QID) | ORAL | 0 refills | Status: AC

## 2017-04-06 MED ORDER — METRONIDAZOLE 250 MG PO TABS: 250.0000 mg | ORAL_TABLET | Freq: Four times a day (QID) | ORAL | 0 refills | Status: AC

## 2017-04-06 MED ORDER — BISMUTH 262 MG PO CHEW: 524.0000 mg | CHEWABLE_TABLET | Freq: Four times a day (QID) | ORAL | 0 refills | Status: AC

## 2017-04-06 NOTE — Telephone Encounter (Signed)
Advised patient of new order for medications due to insurance rejecting Pylera:  Tetracycline Bismuth Metronidazole PPI  Advised patient to take tabs 4 times a day along with regular PPI course.   * No allergies to penicillins or cyclines per patient.

## 2017-04-07 ENCOUNTER — Ambulatory Visit (INDEPENDENT_AMBULATORY_CARE_PROVIDER_SITE_OTHER): Payer: BLUE CROSS/BLUE SHIELD

## 2017-04-07 ENCOUNTER — Encounter (INDEPENDENT_AMBULATORY_CARE_PROVIDER_SITE_OTHER): Payer: Self-pay | Admitting: Vascular Surgery

## 2017-04-07 ENCOUNTER — Ambulatory Visit (INDEPENDENT_AMBULATORY_CARE_PROVIDER_SITE_OTHER): Payer: BLUE CROSS/BLUE SHIELD | Admitting: Vascular Surgery

## 2017-04-07 VITALS — BP 130/63 | HR 66 | Resp 15 | Ht 65.5 in | Wt 167.0 lb

## 2017-04-07 DIAGNOSIS — I701 Atherosclerosis of renal artery: Secondary | ICD-10-CM

## 2017-04-07 DIAGNOSIS — E785 Hyperlipidemia, unspecified: Secondary | ICD-10-CM

## 2017-04-07 NOTE — Progress Notes (Signed)
Subjective:    Patient ID: Jenny Lee, female    DOB: [birth year], 61 y.o.   MRN: [id number] Chief Complaint  Patient presents with  . Follow-up    5 week f/u renal   Patient presents for her first post procedure follow-up.  She is status post a right renal artery angioplasty on March 02, 2017.  Patient presents today without complaint.  Denies any issues with her hypertension.  The patient underwent a bilateral renal artery duplex exam which was notable for Doppler velocities suggesting 1-49% stenosis of the bilateral renal arteries.  Patent renal artery veins.  Patient denies any fever, nausea vomiting.   Review of Systems  Constitutional: Negative.   HENT: Negative.   Eyes: Negative.   Respiratory: Negative.   Cardiovascular: Negative.   Gastrointestinal: Negative.   Endocrine: Negative.   Genitourinary: Negative.   Musculoskeletal: Negative.   Skin: Negative.   Allergic/Immunologic: Negative.   Neurological: Negative.   Hematological: Negative.   Psychiatric/Behavioral: Negative.       Objective:   Physical Exam  Constitutional: She is oriented to person, place, and time. She appears well-developed and well-nourished. No distress.  HENT:  Head: Normocephalic and atraumatic.  Eyes: Conjunctivae are normal. Pupils are equal, round, and reactive to light.  Neck: Normal range of motion.  Cardiovascular: Normal rate, regular rhythm, normal heart sounds and intact distal pulses.  Pulses:      Radial pulses are 2+ on the right side, and 2+ on the left side.       Dorsalis pedis pulses are 2+ on the right side, and 2+ on the left side.       Posterior tibial pulses are 2+ on the right side, and 2+ on the left side.  Pulmonary/Chest: Effort normal and breath sounds normal.  Musculoskeletal: Normal range of motion. She exhibits no edema.  Neurological: She is alert and oriented to person, place, and time.  Skin: Skin is warm and dry. She is not diaphoretic.  Psychiatric:  She has a normal mood and affect. Her behavior is normal. Judgment and thought content normal.  Vitals reviewed.  BP 130/63 (BP Location: Right Arm, Patient Position: Sitting)   Pulse 66   Resp 15   Ht 5' 5.5" (1.664 m)   Wt 167 lb (75.8 kg)   BMI 27.37 kg/m   Past Medical History:  Diagnosis Date  . Anxiety   . CAD (coronary artery disease)   . Female genuine stress incontinence 08/15/2014  . GERD (gastroesophageal reflux disease)   . H/O acute myocardial infarction 08/15/2014   Overview:  2002   . Heart attack ([place])    2001  . HLD (hyperlipidemia) 12/20/2015  . HTN (hypertension) 12/20/2015  . Hx of cardiac cath 12/11/2015   Overview:  Normal coronary arteries 2017  . Hypercholesteremia   . Hypertension   . Mastalgia 10/06/2012  . 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 Tachycardia 12/20/2015  . Takotsubo cardiomyopathy    Social History   Socioeconomic History  . Marital status: Married    Spouse name: Not on file  . Number of children: Not on file  . Years of education: Not on file  . Highest education level: Not on file  Social Needs  . Financial resource strain: Not on file  . Food insecurity - worry: Not on file  . Food insecurity - inability: Not on file  . Transportation needs - medical: Not on file  . Transportation needs - non-medical:  Not on file  Occupational History  . Not on file  Tobacco Use  . Smoking status: Former Smoker    Packs/day: 0.00    Years: 0.00    Pack years: 0.00  . Smokeless tobacco: Never Used  Substance and Sexual Activity  . Alcohol use: Yes    Alcohol/week: 0.0 oz    Comment: occasional wine  . Drug use: No  . Sexual activity: Not on file  Other Topics Concern  . Not on file  Social History Narrative  . Not on file    Past Surgical History:  Procedure Laterality Date  . ABDOMINAL HYSTERECTOMY  2004  . NECK SURGERY  1998   Family History  Problem Relation Age of Onset  . Breast cancer Sister 83    No Known Allergies     Assessment & Plan:  Patient presents for her first post procedure follow-up.  She is status post a right renal artery angioplasty on March 02, 2017.  Patient presents today without complaint.  Denies any issues with her hypertension.  The patient underwent a bilateral renal artery duplex exam which was notable for Doppler velocities suggesting 1-49% stenosis of the bilateral renal arteries.  Patent renal artery veins.  Patient denies any fever, nausea vomiting.  1. Renal artery stenosis (HCC) - Stable Patient presents for first post procedure follow-up Patient presents with complaint Patient presents any issues with hypertension And to follow-up in 3 months with a repeat renal duplex  - VAS US RENAL ARTERY DUPLEX; Future  2. Hyperlipidemia, unspecified hyperlipidemia type - Stable Encouraged good control as its slows the progression of atherosclerotic disease  Current Outpatient Medications on File Prior to Visit  Medication Sig Dispense Refill  . ALPRAZolam (XANAX) 0.5 MG tablet Take 0.5 mg by mouth daily as needed for anxiety.     Marland Kitchen aspirin 81 MG tablet Take 81 mg by mouth daily.    . Bismuth 262 MG CHEW Chew 524 mg 4 (four) times daily for 14 days by mouth. 56 each 0  . carvedilol (COREG) 12.5 MG tablet Take 12.5 mg by mouth 2 (two) times daily with a meal.    . chlorthalidone (HYGROTON) 25 MG tablet Take 12.5 mg by mouth every morning.     Marland Kitchen doxycycline (VIBRA-TABS) 100 MG tablet   0  . enalapril (VASOTEC) 20 MG tablet Take 20 mg by mouth 2 (two) times daily.     Marland Kitchen estradiol (ESTRACE) 0.5 MG tablet Take 0.5 mg by mouth daily.    . Melatonin 10 MG TABS Take 10 mg by mouth at bedtime.     . metroNIDAZOLE (FLAGYL) 250 MG tablet Take 1 tablet (250 mg total) 4 (four) times daily for 14 days by mouth. 56 tablet 0  . ondansetron (ZOFRAN) 4 MG tablet Take 4 mg by mouth every 8 (eight) hours as needed for nausea.   0  . pantoprazole (PROTONIX) 40 MG tablet Take 1  tablet by mouth every morning.   1  . sertraline (ZOLOFT) 100 MG tablet Take 1 tablet by mouth daily.   10  . simvastatin (ZOCOR) 20 MG tablet Take 20 mg by mouth every evening.   4  . tetracycline (ACHROMYCIN,SUMYCIN) 500 MG capsule Take 1 capsule (500 mg total) 4 (four) times daily for 14 days by mouth. 56 capsule 0   No current facility-administered medications on file prior to visit.     There are no Patient Instructions on file for this visit. No Follow-up on file.  Kellyjo Edgren A Oluwatobiloba Martin, PA-C

## 2017-06-02 ENCOUNTER — Encounter: Payer: Self-pay | Admitting: Gastroenterology

## 2017-06-02 ENCOUNTER — Ambulatory Visit (INDEPENDENT_AMBULATORY_CARE_PROVIDER_SITE_OTHER): Payer: BLUE CROSS/BLUE SHIELD | Admitting: Gastroenterology

## 2017-06-02 ENCOUNTER — Encounter (INDEPENDENT_AMBULATORY_CARE_PROVIDER_SITE_OTHER): Payer: Self-pay

## 2017-06-02 VITALS — BP 136/81 | HR 71 | Temp 98.1°F | Ht 65.5 in | Wt 168.8 lb

## 2017-06-02 DIAGNOSIS — R109 Unspecified abdominal pain: Secondary | ICD-10-CM | POA: Diagnosis not present

## 2017-06-02 DIAGNOSIS — A048 Other specified bacterial intestinal infections: Secondary | ICD-10-CM | POA: Diagnosis not present

## 2017-06-02 DIAGNOSIS — K5900 Constipation, unspecified: Secondary | ICD-10-CM | POA: Diagnosis not present

## 2017-06-02 NOTE — Progress Notes (Signed)
Jonathon Bellows MD, MRCP(U.K) [address]  [place]  [place], [place] [address]  Main: [phone]  Fax: [phone]   Primary Care Physician: Perrin Maltese, MD  Primary Gastroenterologist:  Dr. Jonathon Bellows   No chief complaint on file.   HPI: Jenny Lee is a 62 y.o. female   Summary of history :  She was last seen by Dr Allen Norris in 07/2016 for abdominal pain. Known to have had gall stones, LUQ pain , worse at night . Dr Allen Norris felt the pain was musculoskeletal in nature. Seen by Dr Burt Knack who felt surgery would not be recommended. The patient was subsequently scheduled for an EGD+colonoscopy that I performed on 12/23/16 .   Colonoscopy : poor prep - aborted procedure due to solid stool - did take the prep  EGD: Treated for H pylori with Pylera([failed rx in 11/2016 with clarithromycin based therapy ) , gastric intestinal metaplasia noted.   MR abdomen 02/2017 normal HIDA scan -normal    Interval history   3//2018-  06/02/2016   Presently still has abdomen - all over the belly . She has a bowel movement once a day , she tends to get constipated. Denies any NSAID use. Localized pain, not sure if worse when she eats. Some gas.    Current Outpatient Medications  Medication Sig Dispense Refill  . ALPRAZolam (XANAX) 0.25 MG tablet   0  . ALPRAZolam (XANAX) 0.5 MG tablet Take 0.5 mg by mouth daily as needed for anxiety.     Marland Kitchen aspirin 81 MG tablet Take 81 mg by mouth daily.    Marland Kitchen atorvastatin (LIPITOR) 40 MG tablet   0  . carvedilol (COREG) 12.5 MG tablet Take 12.5 mg by mouth 2 (two) times daily with a meal.    . chlorthalidone (HYGROTON) 25 MG tablet Take 12.5 mg by mouth every morning.     Marland Kitchen doxycycline (VIBRA-TABS) 100 MG tablet   0  . enalapril (VASOTEC) 20 MG tablet Take 20 mg by mouth 2 (two) times daily.     Marland Kitchen estradiol (ESTRACE) 0.5 MG tablet Take 0.5 mg by mouth daily.    . Melatonin 10 MG TABS Take 10 mg by mouth at bedtime.     . metroNIDAZOLE (FLAGYL) 500 MG  tablet   0  . omeprazole (PRILOSEC) 40 MG capsule   0  . ondansetron (ZOFRAN) 4 MG tablet Take 4 mg by mouth every 8 (eight) hours as needed for nausea.   0  . pantoprazole (PROTONIX) 40 MG tablet Take 1 tablet by mouth every morning.   1  . sertraline (ZOLOFT) 100 MG tablet Take 1 tablet by mouth daily.   10  . simvastatin (ZOCOR) 20 MG tablet Take 20 mg by mouth every evening.   4   No current facility-administered medications for this visit.     Allergies as of 06/02/2017  . (No Known Allergies)    ROS:  General: Negative for anorexia, weight loss, fever, chills, fatigue, weakness. ENT: Negative for hoarseness, difficulty swallowing , nasal congestion. CV: Negative for chest pain, angina, palpitations, dyspnea on exertion, peripheral edema.  Respiratory: Negative for dyspnea at rest, dyspnea on exertion, cough, sputum, wheezing.  GI: See history of present illness. GU:  Negative for dysuria, hematuria, urinary incontinence, urinary frequency, nocturnal urination.  Endo: Negative for unusual weight change.    Physical Examination:   There were no vitals taken for this visit.  General: Well-nourished, well-developed in no acute distress.  Eyes:  No icterus. Conjunctivae pink. Mouth: Oropharyngeal mucosa moist and pink , no lesions erythema or exudate. Lungs: Clear to auscultation bilaterally. Non-labored. Heart: Regular rate and rhythm, no murmurs rubs or gallops.  Abdomen: Bowel sounds are normal, nontender, nondistended, no hepatosplenomegaly or masses, no abdominal bruits or hernia , no rebound or guarding.   Extremities: No lower extremity edema. No clubbing or deformities. Neuro: Alert and oriented x 3.  Grossly intact. Skin: Warm and dry, no jaundice.   Psych: Alert and cooperative, normal mood and affect.   Imaging Studies: No results found.  Assessment and Plan:   Jenny Lee is a 62 y.o. y/o female here to follow up for abdominal pain. She may suffer from  underlying constipation as she her colon was unprepped despite a full prep, she also has H pylori which did not respond to first line therapy and her pain could be a combination of both . She has stopped her PPI  Plan  1. Check stool for H pylori eradication  2. Repeat colonoscopy due to poor prep  3. Commence on linzess for constipation .2 weeks samples provided.   4. Repeat EGD in 2 years for gastric intestinal metaplasia   Dr Jonathon Bellows  MD,MRCP [facility]) Follow up in 6 weeks

## 2017-06-02 NOTE — Addendum Note (Signed)
Addended by: Peggye Ley on: 06/02/2017 02:12 PM   Modules accepted: Orders

## 2017-06-06 LAB — H. PYLORI ANTIGEN, STOOL: H pylori Ag, Stl: POSITIVE — AB

## 2017-06-08 ENCOUNTER — Telehealth: Payer: Self-pay | Admitting: Gastroenterology

## 2017-06-08 NOTE — Telephone Encounter (Signed)
Patient has called the office & just left he phone # [phone]. I have called # & unable to leave message voice mail not set up.

## 2017-06-08 NOTE — Telephone Encounter (Signed)
Left message for patient to call and schedule an appt with Dr. Vicente Males for H pylori this week

## 2017-06-10 ENCOUNTER — Ambulatory Visit: Payer: BLUE CROSS/BLUE SHIELD | Admitting: Gastroenterology

## 2017-06-10 ENCOUNTER — Encounter: Payer: Self-pay | Admitting: Gastroenterology

## 2017-06-10 VITALS — BP 149/72 | HR 89 | Temp 97.8°F | Ht 65.5 in | Wt 169.2 lb

## 2017-06-10 DIAGNOSIS — A048 Other specified bacterial intestinal infections: Secondary | ICD-10-CM | POA: Diagnosis not present

## 2017-06-10 MED ORDER — AMOXICILLIN 500 MG PO TABS: 1000.0000 mg | ORAL_TABLET | Freq: Two times a day (BID) | ORAL | 0 refills | Status: AC

## 2017-06-10 MED ORDER — LEVOFLOXACIN 500 MG PO TABS: 500.0000 mg | ORAL_TABLET | Freq: Every day | ORAL | 14 refills | Status: DC

## 2017-06-10 NOTE — Progress Notes (Signed)
Gave patient 2 day prep with Suprep sample.   Went over instructions implicitly and wrote corresponding dates.   Patient to pick-up medication at [facility] due to insurance unable to use [facility] at this time.

## 2017-06-10 NOTE — Progress Notes (Signed)
Jenny Bellows MD, MRCP(U.K) [address]  [place]  [place], [place] [address]  Main: [phone]  Fax: [phone]   Primary Care Physician: Perrin Maltese, MD  Primary Gastroenterologist:  Dr. Jonathon Lee   Chief Complaint  Patient presents with  . Follow-up    HPI: Jenny Lee is a 62 y.o. female   Summary of history :  She was last seen by Dr Allen Norris in 07/2016 for abdominal pain. Known to have had gall stones, LUQ pain , worse at night . Dr Allen Norris felt the pain was musculoskeletal in nature. Seen by Dr Burt Knack who felt surgery would not be recommended. The patient was subsequently scheduled for an EGD+colonoscopy that I performed on 12/23/16 .   Colonoscopy : poor prep - aborted procedure due to solid stool - did take the prep  EGD: Treated for H pylori with Pylera([failed rx in 11/2016 with clarithromycin based therapy ) , gastric intestinal metaplasia noted.   MR abdomen 02/2017 normal HIDA scan -normal    Interval history 06/02/2017 -06/10/17  06/04/17- H pylori stool antigen positive   Presently still has abdominal pain. Linzess did not work        Current Outpatient Medications  Medication Sig Dispense Refill  . ALPRAZolam (XANAX) 0.25 MG tablet   0  . aspirin 81 MG tablet Take 81 mg by mouth daily.    Marland Kitchen atorvastatin (LIPITOR) 40 MG tablet   0  . carvedilol (COREG) 12.5 MG tablet Take 12.5 mg by mouth 2 (two) times daily with a meal.    . chlorthalidone (HYGROTON) 25 MG tablet Take 12.5 mg by mouth every morning.     . dicyclomine (BENTYL) 10 MG capsule Take 10 mg by mouth 4 (four) times daily -  before meals and at bedtime.    . enalapril (VASOTEC) 20 MG tablet Take 20 mg by mouth 2 (two) times daily.     Marland Kitchen estradiol (ESTRACE) 0.5 MG tablet Take 0.5 mg by mouth daily.    . Melatonin 10 MG TABS Take 10 mg by mouth at bedtime.     Marland Kitchen omeprazole (PRILOSEC) 40 MG capsule   0  . ondansetron (ZOFRAN) 4 MG tablet Take 4 mg by mouth every 8 (eight)  hours as needed for nausea.   0  . pantoprazole (PROTONIX) 40 MG tablet Take 1 tablet by mouth every morning.   1  . sertraline (ZOLOFT) 100 MG tablet Take 1 tablet by mouth daily.   10  . simvastatin (ZOCOR) 20 MG tablet Take 20 mg by mouth every evening.   4   No current facility-administered medications for this visit.     Allergies as of 06/10/2017  . (No Known Allergies)    ROS:  General: Negative for anorexia, weight loss, fever, chills, fatigue, weakness. ENT: Negative for hoarseness, difficulty swallowing , nasal congestion. CV: Negative for chest pain, angina, palpitations, dyspnea on exertion, peripheral edema.  Respiratory: Negative for dyspnea at rest, dyspnea on exertion, cough, sputum, wheezing.  GI: See history of present illness. GU:  Negative for dysuria, hematuria, urinary incontinence, urinary frequency, nocturnal urination.  Endo: Negative for unusual weight change.    Physical Examination:   BP (!) 149/72 (BP Location: Left Arm, Patient Position: Sitting, Cuff Size: Normal)   Pulse 89   Temp 97.8 F (36.6 C) (Oral)   Ht 5' 5.5" (1.664 m)   Wt 169 lb 3.2 oz (76.7 kg)   BMI 27.73 kg/m   General: Well-nourished,  well-developed in no acute distress.  Eyes: No icterus. Conjunctivae pink. Mouth: Oropharyngeal mucosa moist and pink , no lesions erythema or exudate. Lungs: Clear to auscultation bilaterally. Non-labored. Heart: Regular rate and rhythm, no murmurs rubs or gallops.  Abdomen: Bowel sounds are normal, nontender, nondistended, no hepatosplenomegaly or masses, no abdominal bruits or hernia , no rebound or guarding.   Extremities: No lower extremity edema. No clubbing or deformities. Neuro: Alert and oriented x 3.  Grossly intact. Skin: Warm and dry, no jaundice.   Psych: Alert and cooperative, normal mood and affect.   Imaging Studies: No results found.  Assessment and Plan:   Jenny Lee is a 62 y.o. y/o female here to follow up for  abdominal pain. She may suffer from underlying constipation as she her colon was unprepped despite a full prep, she also has H pylori which did not respond to first line , 2nd line therapy and her pain could be a combination of both .   Plan  1. Check stool for H pylori eradication after 3rd round of antibiotics.  2. Repeat colonoscopy due to poor prep  3. Commence on Trulance  for constipation .2 weeks samples provided.   4. Repeat EGD in 2 years for gastric intestinal metaplasia 5. Will treat H pylori (3rd attempt ) with levofloxacin based therapy and if it fails need to consider Rifabutin based therapy  Dr Jenny Bellows  MD,MRCP [facility]) Follow up in 3 months

## 2017-06-26 ENCOUNTER — Encounter
Admission: RE | Disposition: A | Discharge status: Home / Self Care | Payer: Self-pay | Source: Ambulatory Visit | Attending: Gastroenterology

## 2017-06-26 ENCOUNTER — Ambulatory Visit: Payer: BLUE CROSS/BLUE SHIELD | Admitting: Anesthesiology

## 2017-06-26 ENCOUNTER — Encounter: Payer: Self-pay | Admitting: *Deleted

## 2017-06-26 ENCOUNTER — Ambulatory Visit
Admission: RE | Admit: 2017-06-26 | Discharge: 2017-06-26 | Disposition: A | Discharge status: Home / Self Care | Payer: BLUE CROSS/BLUE SHIELD | Source: Ambulatory Visit | Attending: Gastroenterology | Admitting: Gastroenterology

## 2017-06-26 DIAGNOSIS — Z1211 Encounter for screening for malignant neoplasm of colon: Secondary | ICD-10-CM

## 2017-06-26 DIAGNOSIS — Z87891 Personal history of nicotine dependence: Secondary | ICD-10-CM | POA: Diagnosis not present

## 2017-06-26 DIAGNOSIS — K5730 Diverticulosis of large intestine without perforation or abscess without bleeding: Secondary | ICD-10-CM | POA: Diagnosis not present

## 2017-06-26 DIAGNOSIS — K219 Gastro-esophageal reflux disease without esophagitis: Secondary | ICD-10-CM | POA: Insufficient documentation

## 2017-06-26 DIAGNOSIS — D120 Benign neoplasm of cecum: Secondary | ICD-10-CM

## 2017-06-26 DIAGNOSIS — N993 Prolapse of vaginal vault after hysterectomy: Secondary | ICD-10-CM | POA: Diagnosis not present

## 2017-06-26 DIAGNOSIS — I252 Old myocardial infarction: Secondary | ICD-10-CM | POA: Diagnosis not present

## 2017-06-26 DIAGNOSIS — I2510 Atherosclerotic heart disease of native coronary artery without angina pectoris: Secondary | ICD-10-CM | POA: Diagnosis not present

## 2017-06-26 DIAGNOSIS — I10 Essential (primary) hypertension: Secondary | ICD-10-CM | POA: Diagnosis not present

## 2017-06-26 DIAGNOSIS — B9681 Helicobacter pylori [H. pylori] as the cause of diseases classified elsewhere: Secondary | ICD-10-CM | POA: Diagnosis not present

## 2017-06-26 DIAGNOSIS — E785 Hyperlipidemia, unspecified: Secondary | ICD-10-CM | POA: Insufficient documentation

## 2017-06-26 DIAGNOSIS — K635 Polyp of colon: Secondary | ICD-10-CM

## 2017-06-26 DIAGNOSIS — Z7982 Long term (current) use of aspirin: Secondary | ICD-10-CM | POA: Insufficient documentation

## 2017-06-26 DIAGNOSIS — Z79899 Other long term (current) drug therapy: Secondary | ICD-10-CM | POA: Diagnosis not present

## 2017-06-26 DIAGNOSIS — D125 Benign neoplasm of sigmoid colon: Secondary | ICD-10-CM | POA: Diagnosis not present

## 2017-06-26 DIAGNOSIS — K6389 Other specified diseases of intestine: Secondary | ICD-10-CM | POA: Insufficient documentation

## 2017-06-26 DIAGNOSIS — E7800 Pure hypercholesterolemia, unspecified: Secondary | ICD-10-CM | POA: Diagnosis not present

## 2017-06-26 DIAGNOSIS — F419 Anxiety disorder, unspecified: Secondary | ICD-10-CM | POA: Diagnosis not present

## 2017-06-26 DIAGNOSIS — A048 Other specified bacterial intestinal infections: Secondary | ICD-10-CM

## 2017-06-26 DIAGNOSIS — D122 Benign neoplasm of ascending colon: Secondary | ICD-10-CM | POA: Diagnosis not present

## 2017-06-26 HISTORY — PX: COLONOSCOPY WITH PROPOFOL: SHX5780

## 2017-06-26 SURGERY — COLONOSCOPY WITH PROPOFOL
Anesthesia: General

## 2017-06-26 MED ORDER — FENTANYL CITRATE (PF) 100 MCG/2ML IJ SOLN
INTRAMUSCULAR | Status: AC
  Filled 2017-06-26: qty 2

## 2017-06-26 MED ORDER — PHENYLEPHRINE HCL 10 MG/ML IJ SOLN
INTRAMUSCULAR | Status: DC | PRN
  Administered 2017-06-26: 100 ug via INTRAVENOUS

## 2017-06-26 MED ORDER — PROPOFOL 500 MG/50ML IV EMUL
INTRAVENOUS | Status: DC | PRN
  Administered 2017-06-26: 140 ug/kg/min via INTRAVENOUS

## 2017-06-26 MED ORDER — LIDOCAINE HCL (PF) 2 % IJ SOLN
INTRAMUSCULAR | Status: AC
  Filled 2017-06-26: qty 10

## 2017-06-26 MED ORDER — LIDOCAINE 2% (20 MG/ML) 5 ML SYRINGE
INTRAMUSCULAR | Status: DC | PRN
  Administered 2017-06-26: 30 mg via INTRAVENOUS

## 2017-06-26 MED ORDER — FENTANYL CITRATE (PF) 100 MCG/2ML IJ SOLN
INTRAMUSCULAR | Status: DC | PRN
  Administered 2017-06-26 (×2): 50 ug via INTRAVENOUS

## 2017-06-26 MED ORDER — GLYCOPYRROLATE 0.2 MG/ML IJ SOLN
INTRAMUSCULAR | Status: AC
  Filled 2017-06-26: qty 1

## 2017-06-26 MED ORDER — SODIUM CHLORIDE 0.9 % IV SOLN
INTRAVENOUS | Status: DC
  Administered 2017-06-26: 07:00:00 via INTRAVENOUS

## 2017-06-26 MED ORDER — PROPOFOL 10 MG/ML IV BOLUS
INTRAVENOUS | Status: DC | PRN
  Administered 2017-06-26: 100 mg via INTRAVENOUS

## 2017-06-26 MED ORDER — PROPOFOL 10 MG/ML IV BOLUS
INTRAVENOUS | Status: AC
  Filled 2017-06-26: qty 20

## 2017-06-26 MED ORDER — PROPOFOL 500 MG/50ML IV EMUL
INTRAVENOUS | Status: AC
  Filled 2017-06-26: qty 50

## 2017-06-26 NOTE — Anesthesia Preprocedure Evaluation (Signed)
Anesthesia Evaluation  Patient identified by MRN, date of birth, ID band Patient awake    Reviewed: Allergy & Precautions, NPO status , Patient's Chart, lab work & pertinent test results  History of Anesthesia Complications Negative for: history of anesthetic complications  Airway Mallampati: III  TM Distance: >3 FB Neck ROM: Full    Dental no notable dental hx.    Pulmonary neg sleep apnea, neg COPD, Current Smoker, former smoker,    breath sounds clear to auscultation- rhonchi (-) wheezing      Cardiovascular hypertension, Pt. on medications (-) CAD, (-) Past MI, (-) Cardiac Stents and (-) CABG  Rhythm:Regular Rate:Normal - Systolic murmurs and - Diastolic murmurs Takosubo cardiomyopathy, EF 35%   Neuro/Psych Anxiety negative neurological ROS     GI/Hepatic Neg liver ROS, GERD  ,  Endo/Other  negative endocrine ROSneg diabetes  Renal/GU negative Renal ROS     Musculoskeletal negative musculoskeletal ROS (+)   Abdominal (+) - obese,   Peds  Hematology negative hematology ROS (+)   Anesthesia Other Findings Past Medical History: No date: Anxiety No date: CAD (coronary artery disease) 08/15/2014: Female genuine stress incontinence No date: GERD (gastroesophageal reflux disease) 08/15/2014: H/O acute myocardial infarction     Comment:  Overview:  2002  No date: Heart attack [facility])     Comment:  2001 12/20/2015: HLD (hyperlipidemia) 12/20/2015: HTN (hypertension) 12/11/2015: Hx of cardiac cath     Comment:  Overview:  Normal coronary arteries 2017 No date: Hypercholesteremia No date: Hypertension 10/06/2012: Mastalgia 08/15/2014: Prolapse of vaginal vault after hysterectomy 12/20/2015: Tachycardia No date: Takotsubo cardiomyopathy   Reproductive/Obstetrics                             Anesthesia Physical  Anesthesia Plan  ASA: III  Anesthesia Plan: General   Post-op Pain Management:     Induction: Intravenous  PONV Risk Score and Plan: 1 and Propofol infusion  Airway Management Planned: Natural Airway  Additional Equipment:   Intra-op Plan:   Post-operative Plan:   Informed Consent: I have reviewed the patients History and Physical, chart, labs and discussed the procedure including the risks, benefits and alternatives for the proposed anesthesia with the patient or authorized representative who has indicated his/her understanding and acceptance.   Dental advisory given  Plan Discussed with: CRNA and Anesthesiologist  Anesthesia Plan Comments:         Anesthesia Quick Evaluation

## 2017-06-26 NOTE — Anesthesia Post-op Follow-up Note (Signed)
Anesthesia QCDR form completed.        

## 2017-06-26 NOTE — Anesthesia Postprocedure Evaluation (Signed)
Anesthesia Post Note  Patient: Jenny Lee  Procedure(s) Performed: COLONOSCOPY WITH PROPOFOL (N/A )  Patient location during evaluation: Endoscopy Anesthesia Type: General Level of consciousness: awake and alert and oriented Pain management: pain level controlled Vital Signs Assessment: post-procedure vital signs reviewed and stable Respiratory status: spontaneous breathing, nonlabored ventilation and respiratory function stable Cardiovascular status: blood pressure returned to baseline and stable Postop Assessment: no signs of nausea or vomiting Anesthetic complications: no     Last Vitals:  Vitals:   06/26/17 0851 06/26/17 0901  BP: 102/67 121/67  Pulse:    Resp:    Temp:    SpO2:      Last Pain:  Vitals:   06/26/17 0841  TempSrc:   PainSc: 0-No pain                 Zhara Gieske

## 2017-06-26 NOTE — Transfer of Care (Signed)
Immediate Anesthesia Transfer of Care Note  Patient: Jenny Lee  Procedure(s) Performed: COLONOSCOPY WITH PROPOFOL (N/A )  Patient Location: PACU and Endoscopy Unit  Anesthesia Type:General  Level of Consciousness: sedated  Airway & Oxygen Therapy: Patient Spontanous Breathing and Patient connected to nasal cannula oxygen  Post-op Assessment: Report given to RN and Post -op Vital signs reviewed and stable  Post vital signs: Reviewed and stable  Last Vitals:  Vitals:   06/26/17 0705  BP: (!) 147/84  Pulse: 73  Resp: 16  Temp: (!) 35.9 C  SpO2: 99%    Last Pain:  Vitals:   06/26/17 0705  TempSrc: Tympanic         Complications: No apparent anesthesia complications

## 2017-06-26 NOTE — H&P (Signed)
Vonda Antigua, MD [address] [place], [place], [place], [address] [address], [place], [place], [place], [address] Phone: [phone]  Fax: [phone]  Primary Care Physician:  Perrin Maltese, MD   Pre-Procedure History & Physical: HPI:  Jenny Lee is a 62 y.o. female is here for a colonoscopy .   Past Medical History:  Diagnosis Date  . Anxiety   . CAD (coronary artery disease)   . Female genuine stress incontinence 08/15/2014  . GERD (gastroesophageal reflux disease)   . H/O acute myocardial infarction 08/15/2014   Overview:  2002   . Heart attack ([place])    2001  . HLD (hyperlipidemia) 12/20/2015  . HTN (hypertension) 12/20/2015  . Hx of cardiac cath 12/11/2015   Overview:  Normal coronary arteries 2017  . Hypercholesteremia   . Hypertension   . Mastalgia 10/06/2012  . 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 Tachycardia 12/20/2015  . Takotsubo cardiomyopathy     Past Surgical History:  Procedure Laterality Date  . ABDOMINAL HYSTERECTOMY  2004  . CARDIAC CATHETERIZATION N/A 11/26/2015   Procedure: Left Heart Cath and Coronary Angiography;  Surgeon: Dionisio David, MD;  Location: [facility];  Service: Cardiovascular;  Laterality: N/A;  . COLONOSCOPY WITH PROPOFOL N/A 12/23/2016   Procedure: COLONOSCOPY WITH PROPOFOL;  Surgeon: Jonathon Bellows, MD;  Location: [facility] ENDOSCOPY;  Service: Endoscopy;  Laterality: N/A;  . ESOPHAGOGASTRODUODENOSCOPY (EGD) WITH PROPOFOL N/A 12/23/2016   Procedure: ESOPHAGOGASTRODUODENOSCOPY (EGD) WITH PROPOFOL;  Surgeon: Jonathon Bellows, MD;  Location: [facility] ENDOSCOPY;  Service: Endoscopy;  Laterality: N/A;  . ESOPHAGOGASTRODUODENOSCOPY (EGD) WITH PROPOFOL N/A 03/31/2017   Procedure: ESOPHAGOGASTRODUODENOSCOPY (EGD) WITH PROPOFOL WITH GASTRIC MAPPING;  Surgeon: Jonathon Bellows, MD;  Location: [facility] ENDOSCOPY;  Service: Gastroenterology;  Laterality: N/A;  . NECK SURGERY  1998  . RENAL ANGIOGRAPHY Bilateral 03/02/2017   Procedure: RENAL ANGIOGRAPHY;  Surgeon: Algernon Huxley, MD;  Location: [facility];  Service: Cardiovascular;  Laterality: Bilateral;    Prior to Admission medications   Medication Sig Start Date End Date Taking? Authorizing Provider  ALPRAZolam Duanne Moron) 0.25 MG tablet  04/24/17  Yes [provider]  aspirin 81 MG tablet Take 81 mg by mouth daily.   Yes [provider]  atorvastatin (LIPITOR) 40 MG tablet  04/20/17  Yes [provider]  carvedilol (COREG) 12.5 MG tablet Take 12.5 mg by mouth 2 (two) times daily with a meal.   Yes [provider]  chlorthalidone (HYGROTON) 25 MG tablet Take 12.5 mg by mouth every morning.    Yes [provider]  enalapril (VASOTEC) 20 MG tablet Take 20 mg by mouth 2 (two) times daily.    Yes [provider]  estradiol (ESTRACE) 0.5 MG tablet Take 0.5 mg by mouth daily.   Yes [provider]  omeprazole (PRILOSEC) 40 MG capsule  04/07/17  Yes [provider]  ondansetron (ZOFRAN) 4 MG tablet Take 4 mg by mouth every 8 (eight) hours as needed for nausea.  12/01/16  Yes [provider]  pantoprazole (PROTONIX) 40 MG tablet Take 1 tablet by mouth every morning.  12/04/15  Yes [provider]  sertraline (ZOLOFT) 100 MG tablet Take 1 tablet by mouth daily.  09/09/16  Yes [provider]  dicyclomine (BENTYL) 10 MG capsule Take 10 mg by mouth 4 (four) times daily -  before meals and at bedtime.    [provider]  levofloxacin (LEVAQUIN) 500 MG tablet Take 1  tablet (500 mg total) by mouth daily. Patient not taking: Reported on 06/26/2017 06/10/17   Jonathon Bellows, MD  Melatonin 10 MG TABS Take 10 mg by mouth at bedtime.     [provider]  simvastatin (ZOCOR) 20 MG tablet Take 20 mg by mouth every evening.  10/10/15   [provider]    Allergies as of 06/10/2017  . (No Known Allergies)    Family History  Problem Relation Age of Onset  .  Breast cancer Sister 58    Social History   Socioeconomic History  . Marital status: Married    Spouse name: Not on file  . Number of children: Not on file  . Years of education: Not on file  . Highest education level: Not on file  Social Needs  . Financial resource strain: Not on file  . Food insecurity - worry: Not on file  . Food insecurity - inability: Not on file  . Transportation needs - medical: Not on file  . Transportation needs - non-medical: Not on file  Occupational History  . Not on file  Tobacco Use  . Smoking status: Former Smoker    Packs/day: 0.00    Years: 0.00    Pack years: 0.00  . Smokeless tobacco: Never Used  Substance and Sexual Activity  . Alcohol use: Yes    Alcohol/week: 0.0 oz    Comment: occasional wine  . Drug use: No  . Sexual activity: Yes  Other Topics Concern  . Not on file  Social History Narrative  . Not on file    Review of Systems: See HPI, otherwise negative ROS  Physical Exam: BP (!) 147/84   Pulse 73   Temp (!) 96.7 F (35.9 C) (Tympanic)   Resp 16   Ht 5\' 5"  (1.651 m)   Wt 167 lb (75.8 kg)   SpO2 99%   BMI 27.79 kg/m  General:   Alert,  pleasant and cooperative in NAD Head:  Normocephalic and atraumatic. Neck:  Supple; no masses or thyromegaly. Lungs:  Clear throughout to auscultation, normal respiratory effort.    Heart:  +S1, +S2, Regular rate and rhythm, No edema. Abdomen:  Soft, nontender and nondistended. Normal bowel sounds, without guarding, and without rebound.   Neurologic:  Alert and  oriented x4;  grossly normal neurologically.  Impression/Plan: Jenny Lee is here for a colonoscopy to be performed for average risk screening and abdominal pain.  Risks, benefits, limitations, and alternatives regarding  colonoscopy have been reviewed with the patient.  Questions have been answered.  All parties agreeable.   Virgel Manifold, MD  06/26/2017, 7:33 AM

## 2017-06-26 NOTE — Op Note (Signed)
[facility] Patient Name: Jenny Lee Procedure Date: 06/26/2017 7:34 AM MRN: [id number] Account #: [id number] Date of Birth: 18-Aug-1955 Admit Type: Outpatient Age: 62 Room: [facility] ENDO ROOM 1 Gender: Female Note Status: Finalized Procedure:            Colonoscopy Indications:          Screening for colorectal malignant neoplasm, Incidental                        - Generalized abdominal pain. Pt. has never had a                        screening exam. Her last colonoscopy had a poor prep                        and thus was not a good exam for screening. Providers:            Lennette Bihari. Bonna Gains MD, MD Referring MD:         Perrin Maltese, MD (Referring MD) Medicines:            Monitored Anesthesia Care Complications:        No immediate complications. Procedure:            Pre-Anesthesia Assessment:                       - ASA Grade Assessment: III - A patient with severe                        systemic disease.                       - Prior to the procedure, a History and Physical was                        performed, and patient medications, allergies and                        sensitivities were reviewed. The patient's tolerance of                        previous anesthesia was reviewed.                       - The risks and benefits of the procedure and the                        sedation options and risks were discussed with the                        patient. All questions were answered and informed                        consent was obtained.                       - Patient identification and proposed procedure were                        verified prior to the procedure by the physician, the  nurse, the anesthesiologist, the anesthetist and the                        technician. The procedure was verified in the procedure                        room.                       After obtaining informed consent, the  colonoscope was                        passed under direct vision. Throughout the procedure,                        the patient's blood pressure, pulse, and oxygen                        saturations were monitored continuously. The                        Colonoscope was introduced through the anus and                        advanced to the the cecum, identified by appendiceal                        orifice and ileocecal valve. The colonoscopy was                        performed with ease. The patient tolerated the                        procedure well. The quality of the bowel preparation                        was good. Findings:      The perianal and digital rectal examinations were normal.      Four sessile polyps were found in the sigmoid colon, ascending colon and       cecum. The polyps were 2 to 4 mm in size. These polyps were removed with       a cold biopsy forceps. Resection and retrieval were complete.      A 5 mm polyp was found in the sigmoid colon. The polyp was sessile. The       polyp was removed with a cold snare. Resection and retrieval were       complete. To prevent bleeding after the polypectomy, one hemostatic clip       was successfully placed. There was no bleeding at the end of the       procedure.      A few small-mouthed diverticula were found in the sigmoid colon and       ascending colon.      The exam was otherwise without abnormality.      The rectum, sigmoid colon, descending colon, transverse colon, ascending       colon and cecum appeared normal.      The retroflexed view of the distal rectum and anal verge was normal and       showed no anal or rectal abnormalities. Impression:           -  Four 2 to 4 mm polyps in the sigmoid colon, in the                        ascending colon and in the cecum, removed with a cold                        biopsy forceps. Resected and retrieved.                       - One 5 mm polyp in the sigmoid colon, removed with a                         cold snare. Resected and retrieved. Clip was placed.                       - Diverticulosis in the sigmoid colon and in the                        ascending colon.                       - The examination was otherwise normal.                       - The rectum, sigmoid colon, descending colon,                        transverse colon, ascending colon and cecum are normal.                       - The distal rectum and anal verge are normal on                        retroflexion view. Recommendation:       - Discharge patient to home (with escort).                       - Advance diet as tolerated.                       - Continue present medications.                       - Await pathology results.                       - Repeat colonoscopy in 5 years for surveillance.                       - The findings and recommendations were discussed with                        the patient.                       - The findings and recommendations were discussed with                        the patient's family.                       - Return to primary  care physician as previously                        scheduled.                       - Follow up in [facility] with Dr. Vicente Males in 2-3 months or                        as scheduled.                       - High fiber diet. Procedure Code(s):    --- Professional ---                       [phone], Colonoscopy, flexible; with removal of tumor(s),                        polyp(s), or other lesion(s) by snare technique                       45380, 66, Colonoscopy, flexible; with biopsy, single                        or multiple Diagnosis Code(s):    --- Professional ---                       Z12.11, Encounter for screening for malignant neoplasm                        of colon                       D12.5, Benign neoplasm of sigmoid colon                       D12.2, Benign neoplasm of ascending colon                       D12.0, Benign  neoplasm of cecum                       K57.30, Diverticulosis of large intestine without                        perforation or abscess without bleeding CPT copyright 2016 American Medical Association. All rights reserved. The codes documented in this report are preliminary and upon coder review may  be revised to meet current compliance requirements.  Vonda Antigua, MD Margretta Sidle B. Bonna Gains MD, MD 06/26/2017 8:31:07 AM This report has been signed electronically. Number of Addenda: 0 Note Initiated On: 06/26/2017 7:34 AM Scope Withdrawal Time: 0 hours 32 minutes 34 seconds  Total Procedure Duration: 0 hours 43 minutes 55 seconds  Estimated Blood Loss: Estimated blood loss: none.      [facility]

## 2017-06-29 ENCOUNTER — Encounter: Payer: Self-pay | Admitting: Gastroenterology

## 2017-06-29 LAB — SURGICAL PATHOLOGY

## 2017-07-06 ENCOUNTER — Telehealth: Payer: Self-pay | Admitting: Gastroenterology

## 2017-07-06 NOTE — Telephone Encounter (Signed)
results

## 2017-07-07 ENCOUNTER — Ambulatory Visit: Payer: BLUE CROSS/BLUE SHIELD | Admitting: Gastroenterology

## 2017-07-14 ENCOUNTER — Encounter (INDEPENDENT_AMBULATORY_CARE_PROVIDER_SITE_OTHER): Payer: BLUE CROSS/BLUE SHIELD

## 2017-07-14 ENCOUNTER — Ambulatory Visit (INDEPENDENT_AMBULATORY_CARE_PROVIDER_SITE_OTHER): Payer: BLUE CROSS/BLUE SHIELD | Admitting: Vascular Surgery

## 2017-08-11 ENCOUNTER — Ambulatory Visit (INDEPENDENT_AMBULATORY_CARE_PROVIDER_SITE_OTHER): Payer: BLUE CROSS/BLUE SHIELD | Admitting: Gastroenterology

## 2017-08-11 ENCOUNTER — Encounter (INDEPENDENT_AMBULATORY_CARE_PROVIDER_SITE_OTHER): Payer: Self-pay

## 2017-08-11 ENCOUNTER — Encounter: Payer: Self-pay | Admitting: Gastroenterology

## 2017-08-11 VITALS — BP 145/84 | HR 74 | Ht 65.5 in | Wt 168.2 lb

## 2017-08-11 DIAGNOSIS — K5900 Constipation, unspecified: Secondary | ICD-10-CM

## 2017-08-11 DIAGNOSIS — A048 Other specified bacterial intestinal infections: Secondary | ICD-10-CM

## 2017-08-11 MED ORDER — PLECANATIDE 3 MG PO TABS: 3.0000 mg | ORAL_TABLET | Freq: Every day | ORAL | 1 refills | Status: AC

## 2017-08-11 NOTE — Patient Instructions (Signed)
1.  Complete labs.  2. Pick-up Trulance prescription from pharmacy.

## 2017-08-11 NOTE — Progress Notes (Signed)
Jonathon Bellows MD, MRCP(U.K) [address]  [place]  [place], [place] [address]  Main: [phone]  Fax: [phone]   Primary Care Physician: Perrin Maltese, MD  Primary Gastroenterologist:  Dr. Jonathon Bellows   No chief complaint on file.   HPI: ALEYNA CUEVA is a 62 y.o. female   Summary of history :  She was last seen by Dr Allen Norris in 07/2016 for abdominal pain. Known to have had gall stones, LUQ pain , worse at night . Dr Allen Norris felt the pain was musculoskeletal in nature. Seen by Dr Burt Knack who felt surgery would not be recommended. The patient was subsequently scheduled for an EGD+colonoscopy that I performed on 12/23/16 .   Colonoscopy : poor prep - aborted procedure due to solid stool- did take the prep  EGD: Treated for H pylori with Pylera([failed rx in 11/2016 with clarithromycin based therapy ) , gastric intestinal metaplasia noted.   MR abdomen 02/2017 normal HIDA scan -normal 06/04/17- H pylori stool antigen positive  Interval history 06/10/17-08/08/17   06/26/17 - Colonoscopy - Tubular adenomas x 2 resecred  Trulance worked , stable weight , left sided abdominal , better with a bowel movement. She did not call back for a refill for Trulance. Linzess did not work as well.   Took 14 days of levofloxacin based therapy.   Current Outpatient Medications  Medication Sig Dispense Refill  . ALPRAZolam (XANAX) 0.25 MG tablet   0  . aspirin 81 MG tablet Take 81 mg by mouth daily.    Marland Kitchen atorvastatin (LIPITOR) 40 MG tablet   0  . carvedilol (COREG) 12.5 MG tablet Take 12.5 mg by mouth 2 (two) times daily with a meal.    . chlorthalidone (HYGROTON) 25 MG tablet Take 12.5 mg by mouth every morning.     . dicyclomine (BENTYL) 10 MG capsule Take 10 mg by mouth 4 (four) times daily -  before meals and at bedtime.    . enalapril (VASOTEC) 20 MG tablet Take 20 mg by mouth 2 (two) times daily.     Marland Kitchen estradiol (ESTRACE) 0.5 MG tablet Take 0.5 mg by mouth daily.    Marland Kitchen  levofloxacin (LEVAQUIN) 500 MG tablet Take 1 tablet (500 mg total) by mouth daily. (Patient not taking: Reported on 06/26/2017) 1 tablet 14  . Melatonin 10 MG TABS Take 10 mg by mouth at bedtime.     Marland Kitchen omeprazole (PRILOSEC) 40 MG capsule   0  . ondansetron (ZOFRAN) 4 MG tablet Take 4 mg by mouth every 8 (eight) hours as needed for nausea.   0  . pantoprazole (PROTONIX) 40 MG tablet Take 1 tablet by mouth every morning.   1  . sertraline (ZOLOFT) 100 MG tablet Take 1 tablet by mouth daily.   10  . simvastatin (ZOCOR) 20 MG tablet Take 20 mg by mouth every evening.   4   No current facility-administered medications for this visit.     Allergies as of 08/11/2017  . (No Known Allergies)    ROS:  General: Negative for anorexia, weight loss, fever, chills, fatigue, weakness. ENT: Negative for hoarseness, difficulty swallowing , nasal congestion. CV: Negative for chest pain, angina, palpitations, dyspnea on exertion, peripheral edema.  Respiratory: Negative for dyspnea at rest, dyspnea on exertion, cough, sputum, wheezing.  GI: See history of present illness. GU:  Negative for dysuria, hematuria, urinary incontinence, urinary frequency, nocturnal urination.  Endo: Negative for unusual weight change.    Physical Examination:  There were no vitals taken for this visit.  General: Well-nourished, well-developed in no acute distress.  Eyes: No icterus. Conjunctivae pink. Mouth: Oropharyngeal mucosa moist and pink , no lesions erythema or exudate. Lungs: Clear to auscultation bilaterally. Non-labored. Heart: Regular rate and rhythm, no murmurs rubs or gallops.  Abdomen: Bowel sounds are normal, nontender, nondistended, no hepatosplenomegaly or masses, no abdominal bruits or hernia , no rebound or guarding.   Extremities: No lower extremity edema. No clubbing or deformities. Neuro: Alert and oriented x 3.  Grossly intact. Skin: Warm and dry, no jaundice.   Psych: Alert and cooperative, normal  mood and affect.   Imaging Studies: No results found.  Assessment and Plan:   TRANY CHERNICK is a 62 y.o. y/o female here to follow up for abdominal pain. She may suffer from underlying constipation as she her colon was unprepped despite a full prep, she also has H pylori which did not respond to first line , 2nd line therapy and her pain could be a combination of both .   Plan  1. Check stool for H pylori eradication after 3rd round of antibiotics.   if it has failed  need to consider Rifabutin based therapy 2. Trulance 3 mg once daily 3 Repeat EGD in 2 years for gastric intestinal metaplasia  Dr Jonathon Bellows  MD,MRCP [facility]) Follow up in 4 months

## 2017-08-16 LAB — H. PYLORI ANTIGEN, STOOL: H pylori Ag, Stl: POSITIVE — AB

## 2017-08-18 ENCOUNTER — Ambulatory Visit (INDEPENDENT_AMBULATORY_CARE_PROVIDER_SITE_OTHER): Payer: BLUE CROSS/BLUE SHIELD

## 2017-08-18 ENCOUNTER — Encounter (INDEPENDENT_AMBULATORY_CARE_PROVIDER_SITE_OTHER): Payer: Self-pay | Admitting: Vascular Surgery

## 2017-08-18 ENCOUNTER — Ambulatory Visit (INDEPENDENT_AMBULATORY_CARE_PROVIDER_SITE_OTHER): Payer: BLUE CROSS/BLUE SHIELD | Admitting: Vascular Surgery

## 2017-08-18 VITALS — BP 129/72 | HR 65 | Resp 16 | Ht 64.5 in | Wt 167.2 lb

## 2017-08-18 DIAGNOSIS — I701 Atherosclerosis of renal artery: Secondary | ICD-10-CM | POA: Diagnosis not present

## 2017-08-18 DIAGNOSIS — I150 Renovascular hypertension: Secondary | ICD-10-CM | POA: Diagnosis not present

## 2017-08-18 DIAGNOSIS — I5181 Takotsubo syndrome: Secondary | ICD-10-CM | POA: Diagnosis not present

## 2017-08-18 NOTE — Progress Notes (Signed)
MRN : [id number]  Jenny Lee is a 62 y.o. (Aug 23, 1955) female who presents with chief complaint of  Chief Complaint  Patient presents with  . Follow-up    41month renal artery duplex  .  History of Present Illness: Patient returns today in follow up of renal artery stenosis.  She is about 5 months status post angioplasty for fibromuscular dysplasia creating renal artery stenosis.  She has done well.  She says her blood pressure control is much better.  She has no complaints today.  Her renal artery duplex today shows normal velocities in both renal arteries consistent with no hemodynamically significant stenosis.  Her renal sizes are normal and stable bilaterally.  Current Outpatient Medications  Medication Sig Dispense Refill  . ALPRAZolam (XANAX) 0.25 MG tablet   0  . aspirin 81 MG tablet Take 81 mg by mouth daily.    Marland Kitchen atorvastatin (LIPITOR) 40 MG tablet   0  . carvedilol (COREG) 12.5 MG tablet Take 12.5 mg by mouth 2 (two) times daily with a meal.    . chlorthalidone (HYGROTON) 25 MG tablet Take 12.5 mg by mouth every morning.     . dicyclomine (BENTYL) 10 MG capsule Take 10 mg by mouth 4 (four) times daily -  before meals and at bedtime.    . enalapril (VASOTEC) 20 MG tablet Take 20 mg by mouth 2 (two) times daily.     Marland Kitchen estradiol (ESTRACE) 0.5 MG tablet Take 0.5 mg by mouth daily.    . Melatonin 10 MG TABS Take 10 mg by mouth at bedtime.     . ondansetron (ZOFRAN) 4 MG tablet Take 4 mg by mouth every 8 (eight) hours as needed for nausea.   0  . pantoprazole (PROTONIX) 40 MG tablet Take 1 tablet by mouth every morning.   1  . sertraline (ZOLOFT) 100 MG tablet Take 1 tablet by mouth daily.   10  . levofloxacin (LEVAQUIN) 500 MG tablet Take 1 tablet (500 mg total) by mouth daily. (Patient not taking: Reported on 06/26/2017) 1 tablet 14  . omeprazole (PRILOSEC) 40 MG capsule   0  . Plecanatide (TRULANCE) 3 MG TABS Take 3 mg by mouth daily. (Patient not taking: Reported on  08/18/2017) 90 tablet 1  . simvastatin (ZOCOR) 20 MG tablet Take 20 mg by mouth every evening.   4   No current facility-administered medications for this visit.     Past Medical History:  Diagnosis Date  . Anxiety   . CAD (coronary artery disease)   . Female genuine stress incontinence 08/15/2014  . GERD (gastroesophageal reflux disease)   . H/O acute myocardial infarction 08/15/2014   Overview:  2002   . Heart attack ([place])    2001  . HLD (hyperlipidemia) 12/20/2015  . HTN (hypertension) 12/20/2015  . Hx of cardiac cath 12/11/2015   Overview:  Normal coronary arteries 2017  . Hypercholesteremia   . Hypertension   . Mastalgia 10/06/2012  . 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 Tachycardia 12/20/2015  . Takotsubo cardiomyopathy     Past Surgical History:  Procedure Laterality Date  . ABDOMINAL HYSTERECTOMY  2004  . CARDIAC CATHETERIZATION N/A 11/26/2015   Procedure: Left Heart Cath and Coronary Angiography;  Surgeon: Dionisio David, MD;  Location: [facility];  Service: Cardiovascular;  Laterality: N/A;  . COLONOSCOPY WITH PROPOFOL N/A 12/23/2016   Procedure: COLONOSCOPY WITH PROPOFOL;  Surgeon: Jonathon Bellows, MD;  Location: [facility];  Service: Endoscopy;  Laterality: N/A;  . COLONOSCOPY WITH PROPOFOL N/A 06/26/2017   Procedure: COLONOSCOPY WITH PROPOFOL;  Surgeon: Virgel Manifold, MD;  Location: [facility];  Service: Gastroenterology;  Laterality: N/A;  . ESOPHAGOGASTRODUODENOSCOPY (EGD) WITH PROPOFOL N/A 12/23/2016   Procedure: ESOPHAGOGASTRODUODENOSCOPY (EGD) WITH PROPOFOL;  Surgeon: Jonathon Bellows, MD;  Location: [facility];  Service: Endoscopy;  Laterality: N/A;  . ESOPHAGOGASTRODUODENOSCOPY (EGD) WITH PROPOFOL N/A 03/31/2017   Procedure: ESOPHAGOGASTRODUODENOSCOPY (EGD) WITH PROPOFOL WITH GASTRIC MAPPING;  Surgeon: Jonathon Bellows, MD;  Location: [facility] ENDOSCOPY;  Service: Gastroenterology;  Laterality: N/A;  . NECK SURGERY  1998  . RENAL  ANGIOGRAPHY Bilateral 03/02/2017   Procedure: RENAL ANGIOGRAPHY;  Surgeon: Algernon Huxley, MD;  Location: [facility];  Service: Cardiovascular;  Laterality: Bilateral;    Social History Social History   Tobacco Use  . Smoking status: Former Smoker    Packs/day: 0.00    Years: 0.00    Pack years: 0.00  . Smokeless tobacco: Never Used  Substance Use Topics  . Alcohol use: Yes    Alcohol/week: 0.0 oz    Comment: occasional wine  . Drug use: No      Family History Family History  Problem Relation Age of Onset  . Breast cancer Sister 39     No Known Allergies   REVIEW OF SYSTEMS (Negative unless checked)  Constitutional: [] Weight loss  [] Fever  [] Chills Cardiac: [] Chest pain   [] Chest pressure   [x] Palpitations   [] Shortness of breath when laying flat   [] Shortness of breath at rest   [x] Shortness of breath with exertion. Vascular:  [] Pain in legs with walking   [] Pain in legs at rest   [] Pain in legs when laying flat   [] Claudication   [] Pain in feet when walking  [] Pain in feet at rest  [] Pain in feet when laying flat   [] History of DVT   [] Phlebitis   [] Swelling in legs   [] Varicose veins   [] Non-healing ulcers Pulmonary:   [] Uses home oxygen   [] Productive cough   [] Hemoptysis   [] Wheeze  [] COPD   [] Asthma Neurologic:  [] Dizziness  [] Blackouts   [] Seizures   [] History of stroke   [] History of TIA  [] Aphasia   [] Temporary blindness   [] Dysphagia   [] Weakness or numbness in arms   [] Weakness or numbness in legs Musculoskeletal:  [] Arthritis   [] Joint swelling   [] Joint pain   [] Low back pain Hematologic:  [] Easy bruising  [] Easy bleeding   [] Hypercoagulable state   [] Anemic   Gastrointestinal:  [] Blood in stool   [] Vomiting blood  [x] Gastroesophageal reflux/heartburn   [] Abdominal pain Genitourinary:  [] Chronic kidney disease   [] Difficult urination  [] Frequent urination  [] Burning with urination   [] Hematuria Skin:  [] Rashes   [] Ulcers   [] Wounds Psychological:   [x] History of anxiety   []  History of major depression.  Physical Examination  BP 129/72 (BP Location: Right Arm)   Pulse 65   Resp 16   Ht 5' 4.5" (1.638 m)   Wt 75.8 kg (167 lb 3.2 oz)   BMI 28.26 kg/m  Gen:  WD/WN, NAD Head: [place]/AT, No temporalis wasting. Ear/Nose/Throat: Hearing grossly intact, nares w/o erythema or drainage, trachea midline Eyes: Conjunctiva clear. Sclera non-icteric Neck: Supple.  No JVD.  Pulmonary:  Good air movement, no use of accessory muscles.  Cardiac: RRR, normal S1, S2 Vascular:  Vessel Right Left  Radial Palpable Palpable  Musculoskeletal: M/S 5/5 throughout.  No deformity or atrophy. Neurologic: Sensation grossly intact in extremities.  Symmetrical.  Speech is fluent.  Psychiatric: Judgment intact, Mood & affect appropriate for pt's clinical situation. Dermatologic: No rashes or ulcers noted.  No cellulitis or open wounds.       Labs Recent Results (from the past 2160 hour(s))  H. pylori antigen, stool     Status: Abnormal   Collection Time: 06/04/17  2:56 PM  Result Value Ref Range   H pylori Ag, Stl Positive (A) Negative  Surgical pathology     Status: None   Collection Time: 06/26/17  7:55 AM  Result Value Ref Range   SURGICAL PATHOLOGY      Surgical Pathology CASE: [id number] PATIENT: [place] Surgical Pathology Report     SPECIMEN SUBMITTED: A. Colon polyp x5; cbx/cold snare  CLINICAL HISTORY: None provided  PRE-OPERATIVE DIAGNOSIS: H pylori infection, A04.8  POST-OPERATIVE DIAGNOSIS: Diverticulosis; polyps     DIAGNOSIS: A. COLON POLYP 5; COLD BIOPSY AND COLD SNARE: - TUBULAR ADENOMA (2). - COLONIC MUCOSA WITH PROMINENT LYMPHOID AGGREGATE (1). - COLONIC MUCOSA WITH FOCAL MELANOSIS COLI (MULTIPLE FRAGMENTS). - NEGATIVE FOR HIGH-GRADE DYSPLASIA AND MALIGNANCY.   GROSS DESCRIPTION:  A. Labeled: CBX/cold snare colon polyp 5  Tissue fragment(s):  8  Size: aggregate, 1.6 x 0.8 x 0.1 cm  Description: in formalin, pink-tan fragments  Entirely submitted in 1 cassette(s).          Final Diagnosis performed by Quay Burow, MD.  Electronically signed 06/29/2017 10:43:51AM    The electronic signature indicates that the named Attending Pathologist has evaluated the spe cimen  Technical component performed at [facility], [address], [place], [place] [address] Lab: [phone] Dir: Rush Farmer, MD, MMM  Professional component performed at [facility], [facility], [place], [place], [place] [address] Lab: [phone] Dir: Dellia Nims. Rubinas, MD    H. pylori antigen, stool     Status: Abnormal   Collection Time: 08/14/17  5:53 AM  Result Value Ref Range   H pylori Ag, Stl Positive (A) Negative    Radiology No results found.    Assessment/Plan Takotsubo cardiomyopathy Follows with cardiology  HLD (hyperlipidemia) lipid control important in reducing the progression of atherosclerotic disease. 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  Doing well.  Plan follow-up duplex in 6 months.  Continue current medical regimen.     Leotis Pain, MD  08/18/2017 9:44 AM    This note was created with Dragon medical transcription system.  Any errors from dictation are purely unintentional

## 2017-08-18 NOTE — Assessment & Plan Note (Signed)
Renal duplex shows normal velocities after right renal artery angioplasty 5 months ago for fibromuscular dysplasia.  Doing well.  Plan follow-up duplex in 6 months.  Continue current medical regimen.

## 2017-08-21 ENCOUNTER — Other Ambulatory Visit: Payer: Self-pay

## 2017-08-21 DIAGNOSIS — A048 Other specified bacterial intestinal infections: Secondary | ICD-10-CM

## 2017-08-21 MED ORDER — AMOXICILLIN 500 MG PO TABS: 1000.0000 mg | ORAL_TABLET | Freq: Two times a day (BID) | ORAL | 0 refills | Status: AC

## 2017-08-21 MED ORDER — METRONIDAZOLE 500 MG PO TABS
500.0000 mg | ORAL_TABLET | Freq: Three times a day (TID) | ORAL | 0 refills | Status: AC
Start: 2017-08-21 — End: 2017-09-04

## 2017-08-21 MED ORDER — PANTOPRAZOLE SODIUM 20 MG PO TBEC: 20.0000 mg | DELAYED_RELEASE_TABLET | Freq: Two times a day (BID) | ORAL | 0 refills | Status: DC

## 2017-08-21 MED ORDER — CLARITHROMYCIN 500 MG PO TABS
500.0000 mg | ORAL_TABLET | Freq: Two times a day (BID) | ORAL | 0 refills | Status: AC
Start: 2017-08-21 — End: 2017-09-04

## 2017-08-21 NOTE — Telephone Encounter (Signed)
Patient's labs states she still has H. Pylori.  Now on 3rd round of treatment.   PPI 20 mg BID for 14 days  Clarithromycin (500 mg) BID 14 days  Amoxicillin (1 gm) BID 14 days  Metronidazole (500 mg) TID 14 days  Contacted patient to advise of results and treatment plan. Patient is to call the office for H. Pylori stool test for eradication result.   Contacted pharmacy to ensure the directions are explained explicitly to the patient at pick-up.

## 2017-08-25 ENCOUNTER — Other Ambulatory Visit: Payer: Self-pay

## 2017-08-25 DIAGNOSIS — K5900 Constipation, unspecified: Secondary | ICD-10-CM

## 2017-08-25 MED ORDER — LUBIPROSTONE 24 MCG PO CAPS: 24.0000 ug | ORAL_CAPSULE | Freq: Two times a day (BID) | ORAL | 2 refills | Status: AC

## 2017-09-03 ENCOUNTER — Other Ambulatory Visit: Payer: Self-pay | Admitting: Internal Medicine

## 2017-09-03 DIAGNOSIS — Z1231 Encounter for screening mammogram for malignant neoplasm of breast: Secondary | ICD-10-CM

## 2017-10-01 ENCOUNTER — Ambulatory Visit
Admission: RE | Admit: 2017-10-01 | Discharge: 2017-10-01 | Disposition: A | Discharge status: Home / Self Care | Payer: BLUE CROSS/BLUE SHIELD | Source: Ambulatory Visit | Attending: Internal Medicine | Admitting: Internal Medicine

## 2017-10-01 DIAGNOSIS — Z1231 Encounter for screening mammogram for malignant neoplasm of breast: Secondary | ICD-10-CM | POA: Insufficient documentation

## 2017-10-06 ENCOUNTER — Other Ambulatory Visit: Payer: Self-pay | Admitting: Internal Medicine

## 2017-10-06 DIAGNOSIS — N6002 Solitary cyst of left breast: Secondary | ICD-10-CM

## 2017-10-14 ENCOUNTER — Other Ambulatory Visit: Payer: Self-pay

## 2017-10-14 ENCOUNTER — Ambulatory Visit
Admission: RE | Admit: 2017-10-14 | Discharge: 2017-10-14 | Disposition: A | Discharge status: Home / Self Care | Payer: BLUE CROSS/BLUE SHIELD | Source: Ambulatory Visit | Attending: Internal Medicine | Admitting: Internal Medicine

## 2017-10-14 DIAGNOSIS — N6002 Solitary cyst of left breast: Secondary | ICD-10-CM | POA: Insufficient documentation

## 2017-10-14 DIAGNOSIS — A048 Other specified bacterial intestinal infections: Secondary | ICD-10-CM

## 2017-10-14 NOTE — Progress Notes (Signed)
Ms. Doepke states she finished the medicine and is ready for retesting.   Ordered labs for H. Pylori

## 2017-10-15 ENCOUNTER — Other Ambulatory Visit: Payer: Self-pay | Admitting: Gastroenterology

## 2017-10-17 LAB — H. PYLORI ANTIGEN, STOOL: H pylori Ag, Stl: POSITIVE — AB

## 2017-10-27 ENCOUNTER — Telehealth: Payer: Self-pay | Admitting: Gastroenterology

## 2017-10-27 DIAGNOSIS — A048 Other specified bacterial intestinal infections: Secondary | ICD-10-CM

## 2017-10-27 NOTE — Telephone Encounter (Signed)
Advised patient of H. Pylori result = positive.   Referral has been sent to I.D. @ [facility], Dr. Ola Spurr.

## 2017-10-27 NOTE — Telephone Encounter (Signed)
Patient LVM for results, please call.

## 2017-10-28 ENCOUNTER — Telehealth: Payer: Self-pay | Admitting: Gastroenterology

## 2017-10-28 NOTE — Telephone Encounter (Signed)
Dr. Rolla Flatten  Called in regards to referral he received for pt he will not be taking new pts he would like to give information to referr [facility] to [facility]  [phone]  Fax [phone]  Address [address]

## 2017-11-02 ENCOUNTER — Other Ambulatory Visit: Payer: Self-pay

## 2017-11-02 DIAGNOSIS — A048 Other specified bacterial intestinal infections: Secondary | ICD-10-CM

## 2017-11-02 NOTE — Progress Notes (Signed)
Patient has been referred to [facility] in [place] due to Dr. Ola Spurr not taking any additional patients.

## 2017-11-18 NOTE — Progress Notes (Signed)
Patient: Jenny Lee  DOB: 1956/02/26 MRN: [id number] PCP: Perrin Maltese, MD  Referring Provider: Dr. Vicente Males [facility])  Chief Complaint  Patient presents with  . New Patient (Initial Visit)    also taking mint, terragon, chinese chives, onions, basil (multiple types)     Patient Active Problem List   Diagnosis Date Noted  . H. pylori infection 11/20/2017  . Special screening for malignant neoplasms, colon   . Polyp of sigmoid colon   . Diverticulosis of large intestine without diverticulitis   . Benign neoplasm of ascending colon   . Benign neoplasm of cecum   . 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 Renal artery stenosis ([place]) 02/24/2017  . Tachycardia 12/20/2015  . Takotsubo cardiomyopathy 12/20/2015  . HTN (hypertension) 12/20/2015  . HLD (hyperlipidemia) 12/20/2015  . GERD (gastroesophageal reflux disease) 12/20/2015  . Hx of cardiac cath 12/11/2015  . Syncope 11/26/2015  . H/O acute myocardial infarction 08/15/2014  . Encounter for other preprocedural examination 08/15/2014  . Female genuine stress incontinence 08/15/2014  . 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 Mastalgia 10/06/2012     Subjective:  Jenny Lee is a 62 y.o. F here today for evaluation of treatment for H pylori infection that has been refractory to 4 rounds of treatment now detailed below. 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   12-2016: clarithromycin, amoxicillin 1g BID, PPI --> failed   03-2017: bismuth, metronidazole, tetracycline, PPI --> failed  05-2017: levofloxacin, amoxicillin 1g BID, PPI therapy --> failed 07-2017: protonix BID, metronidazole, clarithromycin, amoxicillin 1g BID --> failed   She and her husband are here today. Frustrated with the previous failures and ongoing pain. She says she has ongoing abdominal pain at night in particular but sometimes during the day as well. She is able to keep foods down and does not struggle with nausea  or vomiting. No weight loss. Last time she had an EGD was about 6 months ago. She generally tolerated the regimens well but sometimes had diarrhea that would resolve after stopping medications. She and her husband are very frustrated and would like to ensure this is taken care of. They have been doing their research and worry about the link to gastrointestinal cancers if this continues. He is also concerned testing previously was obtained too soon after treatment and that may have impacted results.   Review of Systems  Constitutional: Negative for chills, fever, malaise/fatigue and weight loss.  HENT: Negative for sore throat.        No dental problems  Respiratory: Negative for cough.   Cardiovascular: Negative for chest pain and leg swelling.  Gastrointestinal: Positive for abdominal pain and heartburn. Negative for diarrhea and vomiting.  Genitourinary: Negative.  Negative for dysuria.  Musculoskeletal: Negative.   Skin: Negative for rash.  Neurological: Negative for dizziness, tingling and headaches.  Psychiatric/Behavioral: Negative for depression. The patient has insomnia.     Past Medical History:  Diagnosis Date  . Anxiety   . CAD (coronary artery disease)   . Female genuine stress incontinence 08/15/2014  . GERD (gastroesophageal reflux disease)   . H/O acute myocardial infarction 08/15/2014   Overview:  2002   . Heart attack ([place])    2001  . HLD (hyperlipidemia) 12/20/2015  . HTN (hypertension) 12/20/2015  . Hx of cardiac cath 12/11/2015   Overview:  Normal coronary arteries 2017  . Hypercholesteremia   . Hypertension   . Mastalgia 10/06/2012  . Prolapse of vaginal  vault after hysterectomy 08/15/2014  . Tachycardia 12/20/2015  . Takotsubo cardiomyopathy     Outpatient Medications Prior to Visit  Medication Sig Dispense Refill  . aspirin 81 MG tablet Take 81 mg by mouth daily.    Marland Kitchen atorvastatin (LIPITOR) 40 MG tablet   0  . carvedilol (COREG) 12.5 MG tablet Take 12.5 mg by  mouth 2 (two) times daily with a meal.    . chlorthalidone (HYGROTON) 25 MG tablet Take 12.5 mg by mouth every morning.     . clonazePAM (KLONOPIN) 0.5 MG tablet   1  . dicyclomine (BENTYL) 10 MG capsule Take 10 mg by mouth 4 (four) times daily -  before meals and at bedtime.    . enalapril (VASOTEC) 20 MG tablet Take 20 mg by mouth 2 (two) times daily.     Marland Kitchen estradiol (ESTRACE) 0.5 MG tablet Take 0.5 mg by mouth daily.    Marland Kitchen linaclotide (LINZESS) 290 MCG CAPS capsule Take 290 mcg by mouth daily before breakfast.    . Melatonin 10 MG TABS Take 10 mg by mouth at bedtime.     . naproxen sodium (ALEVE) 220 MG tablet Take 220 mg by mouth daily as needed.    . ondansetron (ZOFRAN) 4 MG tablet Take 4 mg by mouth every 8 (eight) hours as needed for nausea.   0  . sertraline (ZOLOFT) 100 MG tablet Take 1 tablet by mouth daily.   10  . ALPRAZolam (XANAX) 0.25 MG tablet   0  . levofloxacin (LEVAQUIN) 500 MG tablet Take 1 tablet (500 mg total) by mouth daily. (Patient not taking: Reported on 06/26/2017) 1 tablet 14  . lubiprostone (AMITIZA) 24 MCG capsule Take 1 capsule (24 mcg total) by mouth 2 (two) times daily with a meal. 60 capsule 2  . pantoprazole (PROTONIX) 40 MG tablet 40 mg.  2  . Vitamin D, Ergocalciferol, (DRISDOL) 50000 units CAPS capsule   1  . omeprazole (PRILOSEC) 40 MG capsule   0  . pantoprazole (PROTONIX) 20 MG tablet Take 1 tablet (20 mg total) by mouth 2 (two) times daily for 14 days. 28 tablet 0  . simvastatin (ZOCOR) 20 MG tablet Take 20 mg by mouth every evening.   4   No facility-administered medications prior to visit.      No Known Allergies  Social History   Tobacco Use  . Smoking status: Former Smoker    Packs/day: 0.00    Years: 0.00    Pack years: 0.00  . Smokeless tobacco: Never Used  Substance Use Topics  . Alcohol use: Yes    Alcohol/week: 0.0 oz    Comment: occasional wine  . Drug use: No    Family History  Problem Relation Age of Onset  . Breast cancer  Sister 45    Objective:   Vitals:   11/19/17 0948  BP: 106/62  Pulse: 72  Weight: 168 lb (76.2 kg)  Height: 5\' 4"  (1.626 m)   Body mass index is 28.84 kg/m.  Physical Exam  Constitutional: She is oriented to person, place, and time.  Seated comfortably in her chair with her husband. Appears healthy and well-nourished.   HENT:  Mouth/Throat: Oropharynx is clear and moist. No oral lesions. No dental abscesses.  Cardiovascular: Normal rate, regular rhythm and normal heart sounds.  No murmur heard. Pulmonary/Chest: Effort normal and breath sounds normal.  Abdominal: Soft. Bowel sounds are normal. She exhibits no distension. There is tenderness (epigastric).  Musculoskeletal: Normal range of  motion. She exhibits no tenderness.  Lymphadenopathy:    She has no cervical adenopathy.  Neurological: She is alert and oriented to person, place, and time. Coordination abnormal.  Skin: Skin is warm and dry. No rash noted.  Psychiatric: Judgment normal.  Vitals reviewed.   Lab Results: Lab Results  Component Value Date   CREATININE 0.85 03/02/2017   Lab Results  Component Value Date   ALT 18 07/02/2016   AST 22 07/02/2016   ALKPHOS 75 07/02/2016   BILITOT 0.4 07/02/2016    Assessment & Plan:   Problem List Items Addressed This Visit      Other   H. pylori infection    She has failed all primary and secondary regimens an displays evidence of intestinal dysplasia from recent EGD. We discussed today the growing failure rate due to antibiotic resistance. I would not consider a rifabutin based regimen considering her cardiac medications and history and potential for DDI's. Some data supporting higher dose amoxicillin 1g TID + PPI BID.   She is due soon for a repeat EGD to follow for progression of her dysplasia - will reach out to her GI team to see if they would consider earlier procedure and include several tissue biopsies to culture and perform susceptibility testing so we can  construct a regimen that will work; they seemed to favor and agree with this idea. Alternatively if we decide to try another regimen I would recommend nitazoxanide quadruple therapy with 500 mg Nitazoxanide, Levofloxacin 500 mg QD, omeprazole 40 mg BID and doxycycline 100 mg BID for 14-days. This regimen has demonstrated a 90% cure rate compared to <80% with traditional triple therapy and offers a less burdensome regimen.   Another thought to consider would be to have her husband tested to ensure he is not an asymptomatic carrier and re-infecting her. Less likely but still a possibility. Will discuss with GI team and then call [facility] to determine what way we will go regarding treatment.         Thank you kindly for the referral and opportunity to meet [employer] [employer].   Janene Madeira, MSN, NP-C [facility] for [facility] [place] Pager: [phone] Office: [phone]  11/20/17  3:40 PM

## 2017-11-19 ENCOUNTER — Encounter: Payer: Self-pay | Admitting: Infectious Diseases

## 2017-11-19 ENCOUNTER — Ambulatory Visit (INDEPENDENT_AMBULATORY_CARE_PROVIDER_SITE_OTHER): Payer: BLUE CROSS/BLUE SHIELD | Admitting: Infectious Diseases

## 2017-11-19 DIAGNOSIS — A048 Other specified bacterial intestinal infections: Secondary | ICD-10-CM

## 2017-11-19 NOTE — Patient Instructions (Signed)
We will call you after we talk with your GI team - this will be from an [phone] phone number.   Would like to talk about re-doing your EGD and getting tissue to test it against medicatoins.   If not will discuss one more alternative regimen with our pharmacy team.   Will be in touch over the phone.   Wonderful to meet you!

## 2017-11-20 DIAGNOSIS — A048 Other specified bacterial intestinal infections: Secondary | ICD-10-CM | POA: Insufficient documentation

## 2017-11-20 NOTE — Assessment & Plan Note (Addendum)
She has failed all primary and secondary regimens an displays evidence of intestinal dysplasia from recent EGD. We discussed today the growing failure rate due to antibiotic resistance. I would not consider a rifabutin based regimen considering her cardiac medications and history and potential for DDI's. Some data supporting higher dose amoxicillin 1g TID + PPI BID.   She is due soon for a repeat EGD to follow for progression of her dysplasia - will reach out to her GI team to see if they would consider earlier procedure and include several tissue biopsies to culture and perform susceptibility testing so we can construct a regimen that will work; they seemed to favor and agree with this idea. Alternatively if we decide to try another regimen I would recommend nitazoxanide quadruple therapy with 500 mg Nitazoxanide, Levofloxacin 500 mg QD, omeprazole 40 mg BID and doxycycline 100 mg BID for 14-days. This regimen has demonstrated a 90% cure rate compared to <80% with traditional triple therapy and offers a less burdensome regimen.   Another thought to consider would be to have her husband tested to ensure he is not an asymptomatic carrier and re-infecting her. Less likely but still a possibility. Will discuss with GI team and then call Jenny Lee to determine what way we will go regarding treatment.

## 2017-12-08 ENCOUNTER — Telehealth: Payer: Self-pay | Admitting: Gastroenterology

## 2017-12-08 DIAGNOSIS — A048 Other specified bacterial intestinal infections: Secondary | ICD-10-CM

## 2017-12-08 NOTE — Telephone Encounter (Signed)
Pt left vm for a nurse to call her

## 2017-12-08 NOTE — Telephone Encounter (Signed)
Patient stated that NP Janene Madeira has suggested that she has a repeat EGD for dysplasia.   I reviewed her office note from the visit and it states "She is due soon for a repeat EGD to follow for progression of her dysplasia - will reach out to her GI team to see if they would consider earlier procedure and include several tissue biopsies to culture and perform susceptibility testing so we can construct a regimen that will work; they seemed to favor and agree with this idea".   I've scheduled her for repeat EGD with you for 12/21/17 Dx: H-Pylori.  Informed pt that I will make sure that you agree with this action- if you do not agree with this I will call her back.  Thanks [employer]

## 2017-12-08 NOTE — Telephone Encounter (Signed)
Agree with the plan for egd as scheduled

## 2017-12-10 ENCOUNTER — Other Ambulatory Visit: Payer: Self-pay

## 2017-12-10 DIAGNOSIS — A048 Other specified bacterial intestinal infections: Secondary | ICD-10-CM

## 2017-12-14 ENCOUNTER — Ambulatory Visit: Payer: BLUE CROSS/BLUE SHIELD | Admitting: Gastroenterology

## 2017-12-14 ENCOUNTER — Encounter: Payer: Self-pay | Admitting: Gastroenterology

## 2017-12-14 VITALS — BP 123/73 | HR 70 | Ht 64.0 in | Wt 174.8 lb

## 2017-12-14 DIAGNOSIS — K31A0 Gastric intestinal metaplasia, unspecified: Secondary | ICD-10-CM

## 2017-12-14 DIAGNOSIS — A048 Other specified bacterial intestinal infections: Secondary | ICD-10-CM | POA: Diagnosis not present

## 2017-12-14 DIAGNOSIS — K5900 Constipation, unspecified: Secondary | ICD-10-CM

## 2017-12-14 DIAGNOSIS — K3189 Other diseases of stomach and duodenum: Secondary | ICD-10-CM

## 2017-12-14 NOTE — Progress Notes (Signed)
Jenny Bellows MD, MRCP(U.K) [address]  [place]  [place], [place] [address]  Main: [phone]  Fax: [phone]   Primary Care Physician: Perrin Maltese, MD  Primary Gastroenterologist:  Dr. Jonathon Lee   No chief complaint on file.   HPI: Jenny Lee is a 62 y.o. female   Summary of history :  She was last seen by Dr Allen Norris in 07/2016 for abdominal pain. Known to have had gall stones, LUQ pain , worse at night . Dr Allen Norris felt the pain was musculoskeletal in nature. Seen by Dr Burt Knack who felt surgery would not be recommended. The patient was subsequently scheduled for an EGD+colonoscopy that I performed on 12/23/16 .   Colonoscopy : poor prep - aborted procedure due to solid stool- did take the prep  EGD: Treated for H pylori with Pylera([failed rx in 11/2016 with clarithromycin based therapy ) , gastric intestinal metaplasia noted.   MR abdomen 02/2017 normal HIDA scan -normal 06/04/17- H pylori stool antigen positive 06/26/17 - Colonoscopy - Tubular adenomas x 2 resecred Took 14 days of levofloxacin based therapy and did not clear either . Then treated with combination of clarithromycin, Amoxicillin , Flagyl, PPI. Referred to ID, they suggested EGD+biopsies and check for culture and sensitivity.   Interval history 08/08/17 -12/14/17   Doing well, burning sensation in her abdomen , on and off constipation, takes linzes  Current Outpatient Medications  Medication Sig Dispense Refill  . ALPRAZolam (XANAX) 0.25 MG tablet   0  . aspirin 81 MG tablet Take 81 mg by mouth daily.    Marland Kitchen atorvastatin (LIPITOR) 40 MG tablet   0  . carvedilol (COREG) 12.5 MG tablet Take 12.5 mg by mouth 2 (two) times daily with a meal.    . chlorthalidone (HYGROTON) 25 MG tablet Take 12.5 mg by mouth every morning.     . clonazePAM (KLONOPIN) 0.5 MG tablet   1  . dicyclomine (BENTYL) 10 MG capsule Take 10 mg by mouth 4 (four) times daily -  before meals and at bedtime.    . enalapril  (VASOTEC) 20 MG tablet Take 20 mg by mouth 2 (two) times daily.     Marland Kitchen estradiol (ESTRACE) 0.5 MG tablet Take 0.5 mg by mouth daily.    Marland Kitchen levofloxacin (LEVAQUIN) 500 MG tablet Take 1 tablet (500 mg total) by mouth daily. (Patient not taking: Reported on 06/26/2017) 1 tablet 14  . linaclotide (LINZESS) 290 MCG CAPS capsule Take 290 mcg by mouth daily before breakfast.    . Melatonin 10 MG TABS Take 10 mg by mouth at bedtime.     . naproxen sodium (ALEVE) 220 MG tablet Take 220 mg by mouth daily as needed.    . ondansetron (ZOFRAN) 4 MG tablet Take 4 mg by mouth every 8 (eight) hours as needed for nausea.   0  . pantoprazole (PROTONIX) 40 MG tablet 40 mg.  2  . sertraline (ZOLOFT) 100 MG tablet Take 1 tablet by mouth daily.   10  . Vitamin D, Ergocalciferol, (DRISDOL) 50000 units CAPS capsule   1   No current facility-administered medications for this visit.     Allergies as of 12/14/2017  . (No Known Allergies)    ROS:  General: Negative for anorexia, weight loss, fever, chills, fatigue, weakness. ENT: Negative for hoarseness, difficulty swallowing , nasal congestion. CV: Negative for chest pain, angina, palpitations, dyspnea on exertion, peripheral edema.  Respiratory: Negative for dyspnea at rest, dyspnea on exertion,  cough, sputum, wheezing.  GI: See history of present illness. GU:  Negative for dysuria, hematuria, urinary incontinence, urinary frequency, nocturnal urination.  Endo: Negative for unusual weight change.    Physical Examination:   There were no vitals taken for this visit.  General: Well-nourished, well-developed in no acute distress.  Eyes: No icterus. Conjunctivae pink. Mouth: Oropharyngeal mucosa moist and pink , no lesions erythema or exudate. Lungs: Clear to auscultation bilaterally. Non-labored. Heart: Regular rate and rhythm, no murmurs rubs or gallops.  Abdomen: Bowel sounds are normal, nontender, nondistended, no hepatosplenomegaly or masses, no abdominal  bruits or hernia , no rebound or guarding.   Extremities: No lower extremity edema. No clubbing or deformities. Neuro: Alert and oriented x 3.  Grossly intact. Skin: Warm and dry, no jaundice.   Psych: Alert and cooperative, normal mood and affect.   Imaging Studies: No results found.  Assessment and Plan:   Jenny Lee is a 62 y.o. y/o female here to follow up for constipation and H pylori which has failed multiple lines of therapy. Seen by ID and advised for EGD+biopsies+culture and sensitivities+ gastric mapping .    Plan  1. EGD 2. Add miralax to linzess   Dr Jenny Bellows  MD,MRCP [facility]) Follow up in 6 weeks

## 2017-12-18 ENCOUNTER — Encounter: Payer: Self-pay | Admitting: Student

## 2017-12-21 ENCOUNTER — Ambulatory Visit: Payer: BLUE CROSS/BLUE SHIELD | Admitting: Anesthesiology

## 2017-12-21 ENCOUNTER — Encounter
Admission: RE | Disposition: A | Discharge status: Home / Self Care | Payer: Self-pay | Source: Ambulatory Visit | Attending: Gastroenterology

## 2017-12-21 ENCOUNTER — Encounter: Payer: Self-pay | Admitting: *Deleted

## 2017-12-21 ENCOUNTER — Ambulatory Visit
Admission: RE | Admit: 2017-12-21 | Discharge: 2017-12-21 | Disposition: A | Discharge status: Home / Self Care | Payer: BLUE CROSS/BLUE SHIELD | Source: Ambulatory Visit | Attending: Gastroenterology | Admitting: Gastroenterology

## 2017-12-21 DIAGNOSIS — I10 Essential (primary) hypertension: Secondary | ICD-10-CM | POA: Diagnosis not present

## 2017-12-21 DIAGNOSIS — E7800 Pure hypercholesterolemia, unspecified: Secondary | ICD-10-CM | POA: Insufficient documentation

## 2017-12-21 DIAGNOSIS — A048 Other specified bacterial intestinal infections: Secondary | ICD-10-CM | POA: Diagnosis not present

## 2017-12-21 DIAGNOSIS — K219 Gastro-esophageal reflux disease without esophagitis: Secondary | ICD-10-CM | POA: Insufficient documentation

## 2017-12-21 DIAGNOSIS — I2510 Atherosclerotic heart disease of native coronary artery without angina pectoris: Secondary | ICD-10-CM | POA: Insufficient documentation

## 2017-12-21 DIAGNOSIS — Z87891 Personal history of nicotine dependence: Secondary | ICD-10-CM | POA: Insufficient documentation

## 2017-12-21 DIAGNOSIS — Z79899 Other long term (current) drug therapy: Secondary | ICD-10-CM | POA: Diagnosis not present

## 2017-12-21 DIAGNOSIS — F419 Anxiety disorder, unspecified: Secondary | ICD-10-CM | POA: Diagnosis not present

## 2017-12-21 DIAGNOSIS — K2970 Gastritis, unspecified, without bleeding: Secondary | ICD-10-CM | POA: Insufficient documentation

## 2017-12-21 DIAGNOSIS — Z7982 Long term (current) use of aspirin: Secondary | ICD-10-CM | POA: Insufficient documentation

## 2017-12-21 HISTORY — PX: ESOPHAGOGASTRODUODENOSCOPY (EGD) WITH PROPOFOL: SHX5813

## 2017-12-21 SURGERY — ESOPHAGOGASTRODUODENOSCOPY (EGD) WITH PROPOFOL
Anesthesia: General

## 2017-12-21 MED ORDER — SODIUM CHLORIDE 0.9 % IV SOLN
INTRAVENOUS | Status: DC
  Administered 2017-12-21: 1000 mL via INTRAVENOUS

## 2017-12-21 MED ORDER — PROPOFOL 500 MG/50ML IV EMUL
INTRAVENOUS | Status: DC | PRN
  Administered 2017-12-21: 140 ug/kg/min via INTRAVENOUS

## 2017-12-21 MED ORDER — LIDOCAINE HCL (CARDIAC) PF 100 MG/5ML IV SOSY
PREFILLED_SYRINGE | INTRAVENOUS | Status: DC | PRN
  Administered 2017-12-21: 60 mg via INTRAVENOUS

## 2017-12-21 MED ORDER — GLYCOPYRROLATE 0.2 MG/ML IJ SOLN
INTRAMUSCULAR | Status: DC | PRN
  Administered 2017-12-21: 0.2 mg via INTRAVENOUS

## 2017-12-21 MED ORDER — PROPOFOL 10 MG/ML IV BOLUS
INTRAVENOUS | Status: DC | PRN
  Administered 2017-12-21: 70 mg via INTRAVENOUS
  Administered 2017-12-21: 10 mg via INTRAVENOUS
  Administered 2017-12-21: 20 mg via INTRAVENOUS

## 2017-12-21 NOTE — Transfer of Care (Signed)
Immediate Anesthesia Transfer of Care Note  Patient: Jenny Lee  Procedure(s) Performed: ESOPHAGOGASTRODUODENOSCOPY (EGD) WITH PROPOFOL (N/A )  Patient Location: PACU  Anesthesia Type:General  Level of Consciousness: sedated  Airway & Oxygen Therapy: Patient Spontanous Breathing and Patient connected to nasal cannula oxygen  Post-op Assessment: Report given to RN and Post -op Vital signs reviewed and stable  Post vital signs: Reviewed and stable  Last Vitals:  Vitals Value Taken Time  BP 106/55 12/21/2017  9:38 AM  Temp 36.2 C 12/21/2017  9:38 AM  Pulse 65 12/21/2017  9:38 AM  Resp 20 12/21/2017  9:38 AM  SpO2 96 % 12/21/2017  9:38 AM    Last Pain:  Vitals:   12/21/17 0841  TempSrc: Tympanic  PainSc: 0-No pain         Complications: No apparent anesthesia complications

## 2017-12-21 NOTE — H&P (Signed)
Jonathon Bellows, MD [address], [place], [place], [place], [address] [address] [place], [place], [place], [address] Phone: [phone]  Fax: [phone]  Primary Care Physician:  Perrin Maltese, MD   Pre-Procedure History & Physical: HPI:  Jenny Lee is a 62 y.o. female is here for an endoscopy    Past Medical History:  Diagnosis Date  . Anxiety   . CAD (coronary artery disease)   . Female genuine stress incontinence 08/15/2014  . GERD (gastroesophageal reflux disease)   . H/O acute myocardial infarction 08/15/2014   Overview:  2002   . Heart attack ([place])    2001  . HLD (hyperlipidemia) 12/20/2015  . HTN (hypertension) 12/20/2015  . Hx of cardiac cath 12/11/2015   Overview:  Normal coronary arteries 2017  . Hypercholesteremia   . Hypertension   . Mastalgia 10/06/2012  . 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 Tachycardia 12/20/2015  . Takotsubo cardiomyopathy     Past Surgical History:  Procedure Laterality Date  . ABDOMINAL HYSTERECTOMY  2004  . CARDIAC CATHETERIZATION N/A 11/26/2015   Procedure: Left Heart Cath and Coronary Angiography;  Surgeon: Dionisio David, MD;  Location: [facility];  Service: Cardiovascular;  Laterality: N/A;  . COLONOSCOPY WITH PROPOFOL N/A 12/23/2016   Procedure: COLONOSCOPY WITH PROPOFOL;  Surgeon: Jonathon Bellows, MD;  Location: [facility] ENDOSCOPY;  Service: Endoscopy;  Laterality: N/A;  . COLONOSCOPY WITH PROPOFOL N/A 06/26/2017   Procedure: COLONOSCOPY WITH PROPOFOL;  Surgeon: Virgel Manifold, MD;  Location: [facility] ENDOSCOPY;  Service: Gastroenterology;  Laterality: N/A;  . ESOPHAGOGASTRODUODENOSCOPY (EGD) WITH PROPOFOL N/A 12/23/2016   Procedure: ESOPHAGOGASTRODUODENOSCOPY (EGD) WITH PROPOFOL;  Surgeon: Jonathon Bellows, MD;  Location: [facility] ENDOSCOPY;  Service: Endoscopy;  Laterality: N/A;  . ESOPHAGOGASTRODUODENOSCOPY (EGD) WITH PROPOFOL N/A 03/31/2017   Procedure: ESOPHAGOGASTRODUODENOSCOPY (EGD) WITH PROPOFOL  WITH GASTRIC MAPPING;  Surgeon: Jonathon Bellows, MD;  Location: [facility] ENDOSCOPY;  Service: Gastroenterology;  Laterality: N/A;  . NECK SURGERY  1998  . RENAL ANGIOGRAPHY Bilateral 03/02/2017   Procedure: RENAL ANGIOGRAPHY;  Surgeon: Algernon Huxley, MD;  Location: [facility];  Service: Cardiovascular;  Laterality: Bilateral;    Prior to Admission medications   Medication Sig Start Date End Date Taking? Authorizing Provider  aspirin 81 MG tablet Take 81 mg by mouth daily.   Yes [provider]  atorvastatin (LIPITOR) 40 MG tablet  04/20/17  Yes [provider]  carvedilol (COREG) 12.5 MG tablet Take 12.5 mg by mouth 2 (two) times daily with a meal.   Yes [provider]  chlorthalidone (HYGROTON) 25 MG tablet Take 12.5 mg by mouth every morning.    Yes [provider]  clonazePAM (KLONOPIN) 0.5 MG tablet  10/28/17  Yes [provider]  dicyclomine (BENTYL) 10 MG capsule Take 10 mg by mouth 4 (four) times daily -  before meals and at bedtime.   Yes [provider]  enalapril (VASOTEC) 20 MG tablet Take 20 mg by mouth 2 (two) times daily.    Yes [provider]  estradiol (ESTRACE) 0.5 MG tablet Take 0.5 mg by mouth daily.   Yes [provider]  Melatonin 10 MG TABS Take 10 mg by mouth at bedtime.    Yes [provider]  ondansetron (ZOFRAN) 4 MG tablet Take 4 mg by mouth every 8 (eight) hours as needed for nausea.  12/01/16  Yes [provider]  pantoprazole (PROTONIX) 40 MG tablet 40 mg.  08/28/17  Yes [provider]  sertraline (ZOLOFT) 100 MG tablet Take 1 tablet by mouth daily.  09/09/16  Yes [provider]  Vitamin D, Ergocalciferol, (DRISDOL) 50000 units CAPS capsule  11/06/17  Yes [provider]  ALPRAZolam Duanne Moron) 0.25 MG tablet  04/24/17   [provider]  levofloxacin (LEVAQUIN) 500 MG tablet Take 1 tablet (500 mg total) by mouth daily. Patient not taking: Reported  on 06/26/2017 06/10/17   Jonathon Bellows, MD  linaclotide [facility]) 290 MCG CAPS capsule Take 290 mcg by mouth daily before breakfast.    [provider]  naproxen sodium (ALEVE) 220 MG tablet Take 220 mg by mouth daily as needed.    [provider]    Allergies as of 12/10/2017  . (No Known Allergies)    Family History  Problem Relation Age of Onset  . Breast cancer Sister 105    Social History   Socioeconomic History  . Marital status: Married    Spouse name: Not on file  . Number of children: Not on file  . Years of education: Not on file  . Highest education level: Not on file  Occupational History  . Not on file  Social Needs  . Financial resource strain: Not on file  . Food insecurity:    Worry: Not on file    Inability: Not on file  . Transportation needs:    Medical: Not on file    Non-medical: Not on file  Tobacco Use  . Smoking status: Former Smoker    Packs/day: 0.00    Years: 0.00    Pack years: 0.00  . Smokeless tobacco: Never Used  Substance and Sexual Activity  . Alcohol use: Yes    Alcohol/week: 0.0 oz    Comment: occasional wine  . Drug use: No  . Sexual activity: Yes  Lifestyle  . Physical activity:    Days per week: Not on file    Minutes per session: Not on file  . Stress: Not on file  Relationships  . Social connections:    Talks on phone: Not on file    Gets together: Not on file    Attends religious service: Not on file    Active member of club or organization: Not on file    Attends meetings of clubs or organizations: Not on file    Relationship status: Not on file  . Intimate partner violence:    Fear of current or ex partner: Not on file    Emotionally abused: Not on file    Physically abused: Not on file    Forced sexual activity: Not on file  Other Topics Concern  . Not on file  Social History Narrative  . Not on file    Review of Systems: See HPI, otherwise negative ROS  Physical Exam: BP (!) 115/54    Pulse 64   Temp (!) 96.8 F (36 C) (Tympanic)   Resp 16   Ht 5\' 5"  (1.651 m)   Wt 169 lb (76.7 kg)   SpO2 100%   BMI 28.12 kg/m  General:   Alert,  pleasant and cooperative in NAD Head:  Normocephalic and atraumatic. Neck:  Supple; no masses or thyromegaly. Lungs:  Clear throughout to auscultation, normal respiratory effort.    Heart:  +S1, +S2, Regular rate and rhythm, No edema. Abdomen:  Soft, nontender and nondistended. Normal bowel sounds, without guarding, and without rebound.   Neurologic:  Alert and  oriented x4;  grossly normal  neurologically.  Impression/Plan: Jenny Lee is here for an endoscopy  to be performed for  evaluation of H pylori culture and sensitivity     Risks, benefits, limitations, and alternatives regarding endoscopy have been reviewed with the patient.  Questions have been answered.  All parties agreeable.   Jonathon Bellows, MD  12/21/2017, 9:11 AM

## 2017-12-21 NOTE — Anesthesia Postprocedure Evaluation (Signed)
Anesthesia Post Note  Patient: Jenny Lee  Procedure(s) Performed: ESOPHAGOGASTRODUODENOSCOPY (EGD) WITH PROPOFOL (N/A )  Patient location during evaluation: Endoscopy Anesthesia Type: General Level of consciousness: awake and alert Pain management: pain level controlled Vital Signs Assessment: post-procedure vital signs reviewed and stable Respiratory status: spontaneous breathing, nonlabored ventilation, respiratory function stable and patient connected to nasal cannula oxygen Cardiovascular status: blood pressure returned to baseline and stable Postop Assessment: no apparent nausea or vomiting Anesthetic complications: no     Last Vitals:  Vitals:   12/21/17 0938 12/21/17 0940  BP: (!) 106/55   Pulse: 65 68  Resp: 20 18  Temp: (!) 36.2 C (!) 36.1 C  SpO2: 96%     Last Pain:  Vitals:   12/21/17 1022  TempSrc:   PainSc: 0-No pain                 Precious Haws Micala Saltsman

## 2017-12-21 NOTE — Op Note (Signed)
[facility] Gastroenterology Patient Name: Jenny Lee Procedure Date: 12/21/2017 9:08 AM MRN: [id number] Account #: [id number] Date of Birth: July 25, 1955 Admit Type: Outpatient Age: 62 Room: [facility] ENDO ROOM 3 Gender: Female Note Status: Finalized Procedure:            Upper GI endoscopy Indications:          For therapy of Helicobacter pylori Providers:            Jonathon Bellows MD, MD Referring MD:         Perrin Maltese, MD (Referring MD) Medicines:            Monitored Anesthesia Care Complications:        No immediate complications. Procedure:            Pre-Anesthesia Assessment:                       - ASA Grade Assessment: II - A patient with mild                        systemic disease.                       After obtaining informed consent, the endoscope was                        passed under direct vision. Throughout the procedure,                        the patient's blood pressure, pulse, and oxygen                        saturations were monitored continuously. The Endoscope                        was introduced through the mouth, and advanced to the                        third part of duodenum. The upper GI endoscopy was                        accomplished with ease. The patient tolerated the                        procedure well. Findings:      The examined duodenum was normal.      The esophagus was normal.      A medium amount of food (residue) was found in the gastric body.      The cardia and gastric fundus were normal on retroflexion.      Diffuse moderate inflammation characterized by congestion (edema) and       erythema was found in the gastric antrum. Biopsies were taken with a       cold forceps for histology. Impression:           - Normal examined duodenum.                       - Normal esophagus.                       - A medium amount of food (residue) in the stomach.                       -  Gastritis. Biopsied. Recommendation:        - Discharge patient to home (with escort).                       - Resume previous diet.                       - Continue present medications.                       - Await pathology results.                       - Biopsies sent for H pylori culture and sensitivity . Procedure Code(s):    --- Professional ---                       [phone], Esophagogastroduodenoscopy, flexible, transoral;                        with biopsy, single or multiple Diagnosis Code(s):    --- Professional ---                       K29.70, Gastritis, unspecified, without bleeding                       F81.82, Helicobacter pylori [H. pylori] as the cause of                        diseases classified elsewhere CPT copyright 2017 American Medical Association. All rights reserved. The codes documented in this report are preliminary and upon coder review may  be revised to meet current compliance requirements. Jonathon Bellows, MD Jonathon Bellows MD, MD 12/21/2017 9:36:39 AM This report has been signed electronically. Number of Addenda: 0 Note Initiated On: 12/21/2017 9:08 AM      [facility]

## 2017-12-21 NOTE — Anesthesia Preprocedure Evaluation (Signed)
Anesthesia Evaluation  Patient identified by MRN, date of birth, ID band Patient awake    Reviewed: Allergy & Precautions, NPO status , Patient's Chart, lab work & pertinent test results  History of Anesthesia Complications Negative for: history of anesthetic complications  Airway Mallampati: III  TM Distance: >3 FB Neck ROM: Full    Dental no notable dental hx. (+) Poor Dentition, Chipped   Pulmonary neg sleep apnea, neg COPD, Current Smoker, former smoker,    breath sounds clear to auscultation- rhonchi (-) wheezing      Cardiovascular hypertension, Pt. on medications + CAD and + Peripheral Vascular Disease  (-) Past MI, (-) Cardiac Stents and (-) CABG  Rhythm:Regular Rate:Normal - Systolic murmurs and - Diastolic murmurs Takosubo cardiomyopathy, EF 35%   Neuro/Psych Anxiety negative neurological ROS     GI/Hepatic Neg liver ROS, GERD  ,  Endo/Other  negative endocrine ROSneg diabetes  Renal/GU Renal diseasenegative Renal ROS     Musculoskeletal negative musculoskeletal ROS (+)   Abdominal (+) - obese,   Peds  Hematology negative hematology ROS (+)   Anesthesia Other Findings Past Medical History: No date: Anxiety No date: CAD (coronary artery disease) 08/15/2014: Female genuine stress incontinence No date: GERD (gastroesophageal reflux disease) 08/15/2014: H/O acute myocardial infarction     Comment:  Overview:  2002  No date: Heart attack [facility])     Comment:  2001 12/20/2015: HLD (hyperlipidemia) 12/20/2015: HTN (hypertension) 12/11/2015: Hx of cardiac cath     Comment:  Overview:  Normal coronary arteries 2017 No date: Hypercholesteremia No date: Hypertension 10/06/2012: Mastalgia 08/15/2014: Prolapse of vaginal vault after hysterectomy 12/20/2015: Tachycardia No date: Takotsubo cardiomyopathy   Reproductive/Obstetrics                             Anesthesia Physical  Anesthesia  Plan  ASA: III  Anesthesia Plan: General   Post-op Pain Management:    Induction: Intravenous  PONV Risk Score and Plan: 1 and Propofol infusion and TIVA  Airway Management Planned: Natural Airway  Additional Equipment:   Intra-op Plan:   Post-operative Plan:   Informed Consent: I have reviewed the patients History and Physical, chart, labs and discussed the procedure including the risks, benefits and alternatives for the proposed anesthesia with the patient or authorized representative who has indicated his/her understanding and acceptance.   Dental advisory given  Plan Discussed with: CRNA and Anesthesiologist  Anesthesia Plan Comments: (Patient consented for risks of anesthesia including but not limited to:  - adverse reactions to medications - risk of intubation if required - damage to teeth, lips or other oral mucosa - sore throat or hoarseness - Damage to heart, brain, lungs or loss of life  Patient voiced understanding.)        Anesthesia Quick Evaluation

## 2017-12-21 NOTE — Anesthesia Post-op Follow-up Note (Signed)
Anesthesia QCDR form completed.        

## 2017-12-23 ENCOUNTER — Telehealth: Payer: Self-pay | Admitting: Infectious Diseases

## 2017-12-23 NOTE — Telephone Encounter (Signed)
Called Jenny Lee and left a VM asking to call back with update - I see she had a repeat EGD and I will be on the look out for the results so we can make a regimen for her H pylori infection that will hopefully cure her.   If she calls back please ask her to call our office or sent a MyChart once she hears of results from her GI team so I can get her back for a visit.   Colletta Maryland, NP

## 2017-12-29 LAB — MISC LABCORP TEST (SEND OUT)
LABCORP TEST CODE: 180885
LabCorp test name: 180885

## 2017-12-30 ENCOUNTER — Telehealth: Payer: Self-pay

## 2017-12-30 NOTE — Telephone Encounter (Signed)
Pt calling for results of her recent EGD.

## 2017-12-31 ENCOUNTER — Other Ambulatory Visit: Payer: Self-pay

## 2017-12-31 DIAGNOSIS — A048 Other specified bacterial intestinal infections: Secondary | ICD-10-CM

## 2017-12-31 NOTE — Telephone Encounter (Signed)
Dr. Vicente Males,  I have contacted [facility]. They cannot generate the report as you are requesting. I have contacted the pt and informed her of the result and the need to repeat the H pylori test. Order done. I have also messaged her ID physician to let her know the plan.

## 2017-12-31 NOTE — Telephone Encounter (Signed)
Jenny Lee  The lab test says that she had no H pylori - can we enquire with the lab if in addition to the culture if a path report can be generated on the biopsy specimens to check and be sure that there are no H Pylori. If they cant do that we will need to repeat stool H pylori antigen . Can we send the culture report to her ID physician whom she has seen please

## 2018-01-08 LAB — H. PYLORI ANTIGEN, STOOL: H PYLORI AG STL: POSITIVE — AB

## 2018-01-18 ENCOUNTER — Telehealth: Payer: Self-pay | Admitting: Gastroenterology

## 2018-01-18 NOTE — Telephone Encounter (Signed)
PT  Is calling for results

## 2018-01-20 NOTE — Telephone Encounter (Signed)
Called pt to discuss Hpylori result and to inform her of Dr. Georgeann Oppenheim instructions to have Hpylori breath test performed for confirmation. Pt will need to follow proper prep before the breath test can be performed. LVM to return call

## 2018-01-22 ENCOUNTER — Telehealth: Payer: Self-pay | Admitting: Gastroenterology

## 2018-01-22 NOTE — Telephone Encounter (Signed)
Pt left vm to get results

## 2018-01-26 ENCOUNTER — Ambulatory Visit (INDEPENDENT_AMBULATORY_CARE_PROVIDER_SITE_OTHER): Payer: BLUE CROSS/BLUE SHIELD | Admitting: Gastroenterology

## 2018-01-26 ENCOUNTER — Encounter: Payer: Self-pay | Admitting: Gastroenterology

## 2018-01-26 VITALS — BP 128/75 | HR 73 | Ht 64.0 in | Wt 172.6 lb

## 2018-01-26 DIAGNOSIS — A048 Other specified bacterial intestinal infections: Secondary | ICD-10-CM | POA: Diagnosis not present

## 2018-01-26 NOTE — Progress Notes (Signed)
Jonathon Bellows MD, MRCP(U.K) [address]  [place]  [place], [place] [address]  Main: [phone]  Fax: [phone]   Primary Care Physician: Perrin Maltese, MD  Primary Gastroenterologist:  Dr. Jonathon Bellows   Chief Complaint  Patient presents with  . Follow-up    Hpylori    HPI: Jenny Lee is a 62 y.o. female    Summary of history :  She was last seen by Dr Allen Norris in 07/2016 for abdominal pain. Known to have had gall stones, LUQ pain , worse at night . Dr Allen Norris felt the pain was musculoskeletal in nature. Seen by Dr Burt Knack who felt surgery would not be recommended. The patient was subsequently scheduled for an EGD+colonoscopy that I performed on 12/23/16 .   Colonoscopy : poor prep - aborted procedure due to solid stool- did take the prep  EGD: Treated for H pylori with Pylera([failed rx in 11/2016 with clarithromycin based therapy ) , gastric intestinal metaplasia noted.  MR abdomen 02/2017 normal HIDA scan -normal 06/04/17- H pylori stool antigen positive 06/26/17 - Colonoscopy - Tubular adenomas x 2 resected  Took 14 days of levofloxacin based therapy and did not clear either . Then treated with combination of clarithromycin, Amoxicillin , Flagyl, PPI. Referred to ID, they suggested EGD+biopsies and check for culture and sensitivity.   Interval history 12/14/17 -01/26/18  EGD+ cultures showed no H pylori but repeat stool antigen test was positive, discussed with ID and plan was to perform H pylori breath testing.   Doing well , pain better, off all PPI and acid supression for weeks,      Current Outpatient Medications  Medication Sig Dispense Refill  . ALPRAZolam (XANAX) 0.25 MG tablet   0  . aspirin 81 MG tablet Take 81 mg by mouth daily.    Marland Kitchen atorvastatin (LIPITOR) 40 MG tablet   0  . carvedilol (COREG) 12.5 MG tablet Take 12.5 mg by mouth 2 (two) times daily with a meal.    . chlorthalidone (HYGROTON) 25 MG tablet Take 12.5 mg by mouth every  morning.     . clonazePAM (KLONOPIN) 0.5 MG tablet   1  . dicyclomine (BENTYL) 10 MG capsule Take 10 mg by mouth 4 (four) times daily -  before meals and at bedtime.    . enalapril (VASOTEC) 20 MG tablet Take 20 mg by mouth 2 (two) times daily.     Marland Kitchen estradiol (ESTRACE) 0.5 MG tablet Take 0.5 mg by mouth daily.    Marland Kitchen levofloxacin (LEVAQUIN) 500 MG tablet Take 1 tablet (500 mg total) by mouth daily. (Patient not taking: Reported on 06/26/2017) 1 tablet 14  . linaclotide (LINZESS) 290 MCG CAPS capsule Take 290 mcg by mouth daily before breakfast.    . Melatonin 10 MG TABS Take 10 mg by mouth at bedtime.     . naproxen sodium (ALEVE) 220 MG tablet Take 220 mg by mouth daily as needed.    . ondansetron (ZOFRAN) 4 MG tablet Take 4 mg by mouth every 8 (eight) hours as needed for nausea.   0  . pantoprazole (PROTONIX) 40 MG tablet 40 mg.  2  . sertraline (ZOLOFT) 100 MG tablet Take 1 tablet by mouth daily.   10  . Vitamin D, Ergocalciferol, (DRISDOL) 50000 units CAPS capsule   1   No current facility-administered medications for this visit.     Allergies as of 01/26/2018  . (No Known Allergies)    ROS:  General: Negative  for anorexia, weight loss, fever, chills, fatigue, weakness. ENT: Negative for hoarseness, difficulty swallowing , nasal congestion. CV: Negative for chest pain, angina, palpitations, dyspnea on exertion, peripheral edema.  Respiratory: Negative for dyspnea at rest, dyspnea on exertion, cough, sputum, wheezing.  GI: See history of present illness. GU:  Negative for dysuria, hematuria, urinary incontinence, urinary frequency, nocturnal urination.  Endo: Negative for unusual weight change.    Physical Examination:   BP 128/75   Pulse 73   Ht 5\' 4"  (1.626 m)   Wt 172 lb 9.6 oz (78.3 kg)   BMI 29.63 kg/m   General: Well-nourished, well-developed in no acute distress.  Eyes: No icterus. Conjunctivae pink. Mouth: Oropharyngeal mucosa moist and pink , no lesions erythema or  exudate. Lungs: Clear to auscultation bilaterally. Non-labored. Heart: Regular rate and rhythm, no murmurs rubs or gallops.  Abdomen: Bowel sounds are normal, nontender, nondistended, no hepatosplenomegaly or masses, no abdominal bruits or hernia , no rebound or guarding.   Extremities: No lower extremity edema. No clubbing or deformities. Neuro: Alert and oriented x 3.  Grossly intact. Skin: Warm and dry, no jaundice.   Psych: Alert and cooperative, normal mood and affect.   Imaging Studies: No results found.  Assessment and Plan:   Jenny Lee is a 62 y.o. y/o female  here to follow up for constipation and H pylori which has failed multiple lines of therapy. Seen by ID and advised for EGD+biopsies+culture and sensitivities Gastric biopsies with culture was negative for H pylori but subsequent stool antigen testing was positive. Decided to proceed with breath testing  .    Plan  1. Urease breath testing today off all PPI and antacids for more than 4 weeks 2 . Gastric mapping in 6 months  Dr Jonathon Bellows  MD,MRCP [facility]) Follow up in 8 weeks

## 2018-01-26 NOTE — Telephone Encounter (Signed)
Spoke with pt about Hpylori results and Dr. Georgeann Oppenheim instructions to have Hpylori breath test performed. I also reminded pt of her follow up appointment today at 1:30 with Dr. Vicente Males.

## 2018-01-27 ENCOUNTER — Telehealth: Payer: Self-pay | Admitting: Gastroenterology

## 2018-01-27 NOTE — Telephone Encounter (Signed)
Pt left vm for results °

## 2018-01-28 LAB — H. PYLORI BREATH TEST: H PYLORI BREATH TEST: POSITIVE — AB

## 2018-01-28 NOTE — Telephone Encounter (Signed)
Pt is calling for results

## 2018-01-31 ENCOUNTER — Encounter: Payer: Self-pay | Admitting: Gastroenterology

## 2018-01-31 NOTE — Telephone Encounter (Signed)
  Inform patient that the breath test is "positive for H pylori ", I have got in touch with her ID and will be having her start on a new antibiotics regime.

## 2018-02-01 ENCOUNTER — Telehealth: Payer: Self-pay | Admitting: Infectious Diseases

## 2018-02-01 MED ORDER — AMOXICILLIN 500 MG PO CAPS: 500.0000 mg | ORAL_CAPSULE | Freq: Two times a day (BID) | ORAL | 0 refills | Status: DC

## 2018-02-01 MED ORDER — RIFABUTIN 150 MG PO CAPS: 300.0000 mg | ORAL_CAPSULE | Freq: Every day | ORAL | 0 refills | Status: DC

## 2018-02-01 NOTE — Telephone Encounter (Signed)
Please call Jenny Lee to let her know that we are sending in a regimen for her for her ongoing H. Pylori infection. She needs to take the following for 10 days:  1. Rifabutin - 2 capsules once daily (300 mg) with food.  2. Amoxicillin - 2 capsules twice daily   3. Protonix 40 mg - one tablet twice daily   Most common side effects are GI (nausea, abdominal pain).   Once she completes the above for 10 days she needs to follow up with her GI team regarding further instructions on submiting a post-treatment stool sample 4 weeks after treatment. She should hold further Protonix until she speaks with them.   Thank you!

## 2018-02-02 ENCOUNTER — Telehealth: Payer: Self-pay

## 2018-02-02 NOTE — Telephone Encounter (Signed)
-----   Message from Jonathon Bellows, MD sent at 02/01/2018  1:35 PM EDT ----- Regarding: RE: update Thank you so much .    I will have [place] call the patient and have her come into the office for a breath test for H pylori in 8 weeks after the treatment is completed  Kiran  ----- Message ----- From: [place] Callas, NP Sent: 02/01/2018   1:02 PM EDT To: Jonathon Bellows, MD Subject: RE: update                                     Happy to do that. We will call her as well to discuss.   I have read studies that are successful on as little as 7 days of treatment with this regimen, however I am going to go for 10 days with her. Hopeful that she can tolerate side effects from the rifabutin.   Would you mind arranging follow up post-treatment stool antigen test please? I know it's easier for her to drop that off to your office than ours.   Thank you for the collaboration - very appreciated.  [place]   ----- Message ----- From: Jonathon Bellows, MD Sent: 01/31/2018   5:25 PM EDT To: [place] Callas, NP Subject: RE: update                                     Thankl you for the quick reply,  Would you be willing to prescribe the regime, I have never done a rifabutin based regime in the past .  How long do you generally recommend we treat?  Kiran  ----- Message ----- From: [place] Callas, NP Sent: 01/29/2018  11:08 AM EDT To: Jonathon Bellows, MD Subject: RE: update                                     Interesting.  Yes let's try the rifabutin therapy with her; she was still pretty miserable with abd pain and symptoms that I thing 2/3 diagnostics outside the biopsy would argue to try again. seems to have more consistent data to show >85% eradication rate compared to some of the other salvage therapies.   I reviewed her medication list again with our pharmacist and aside from the Lipitor there is no major drug interactions (if she were on this longer than the 14d I would recommend discussing with her  cardiology team to increase the dose of Lipitor but so short term not likely to make much of a difference).  Fingers crossed...   ----- Message ----- From: Jonathon Bellows, MD Sent: 01/28/2018  10:09 AM EDT To: [place] Callas, NP Subject: update                                         Got her H pylori breath test- back as positive. I guess we have two results which are positive- do you think she would do well on a rifabutin based therapy for H pylori ?  Regards  Kiran

## 2018-02-02 NOTE — Telephone Encounter (Signed)
Thank you :)

## 2018-02-02 NOTE — Telephone Encounter (Signed)
Relayed instructions to patient. Her regular pharmacy is in transition, so she will pick them up from [facility] in [place]. She will follow with GI.

## 2018-02-02 NOTE — Telephone Encounter (Signed)
Pt has been notified of results and is aware that she will be contacted by the Infectious Disease clinic regarding treatment. Pt is also aware that she will need to return to our office for a repeat H pylori breath test, per Dr. Vicente Males, 8 weeks after treatment.

## 2018-02-05 ENCOUNTER — Telehealth: Payer: Self-pay | Admitting: Behavioral Health

## 2018-02-05 ENCOUNTER — Other Ambulatory Visit: Payer: Self-pay | Admitting: Behavioral Health

## 2018-02-05 DIAGNOSIS — A048 Other specified bacterial intestinal infections: Secondary | ICD-10-CM

## 2018-02-05 MED ORDER — RIFABUTIN 150 MG PO CAPS
300.0000 mg | ORAL_CAPSULE | Freq: Every day | ORAL | 0 refills | Status: AC
Start: 2018-02-05 — End: 2018-02-15

## 2018-02-05 MED ORDER — AMOXICILLIN 500 MG PO CAPS: 500.0000 mg | ORAL_CAPSULE | Freq: Two times a day (BID) | ORAL | 0 refills | Status: AC

## 2018-02-05 NOTE — Telephone Encounter (Signed)
Patient called stating Jenny Lee sent medications prescriptions to [facility] but her insurance does not cover it.  She asked that medication be sent to [facility]  [facility]  Also explained that she is to take the Amoxicillin, Protonix, and Rifabutin for 10 days and explained how she is to take them.  Also explained that she is to follow up with her GI team regarding further instructions on submitting post treatment stool sample.  Patient verbalized understanding.   Pricilla Riffle RN

## 2018-02-05 NOTE — Telephone Encounter (Signed)
Wonderful thank you for the reinforcement and taking good care of her!

## 2018-02-11 ENCOUNTER — Telehealth: Payer: Self-pay | Admitting: *Deleted

## 2018-02-11 NOTE — Telephone Encounter (Signed)
Patient called to advise the Rifabutin she was given is to expensive for her to pick up and she wants to know it there is a different option. Advised her will ask the provider and give her a call back. She did pick up the Amoxicillin but has not started taking it will wait until we respond.

## 2018-02-11 NOTE — Telephone Encounter (Signed)
Spoke with [place] for advice, asked [facility] to place the prescription on hold in the mean time.  Marena Chancy was looking into patient assistance/copay assistance as well. Landis Gandy, RN

## 2018-02-11 NOTE — Telephone Encounter (Signed)
Let me reach out to Jenny Lee to see if there is any other way to help with getting her this. She has failed all other conventional attempts to cure her H Pylori and not many other options to try.Marland KitchenMarland KitchenMarland Kitchen

## 2018-02-11 NOTE — Telephone Encounter (Signed)
[facility] and using the Good Rx card - 20 capsules cost about $93 if she does not go through her insurance. Maybe we can try that route? Would one of you call [place] to see about this option?

## 2018-02-12 NOTE — Telephone Encounter (Signed)
Sounds good. Hopefully this is a more reasonable option for her. We could try a high dose amoxicillin regimen with PPI but it does not have as good of data for salvage HPylori. That can be our plan B, however.

## 2018-02-12 NOTE — Telephone Encounter (Signed)
RN relayed information about GoodRx to [employer]. She may  Need to call back for clarification when her husband is home. Landis Gandy, RN

## 2018-02-23 ENCOUNTER — Ambulatory Visit (INDEPENDENT_AMBULATORY_CARE_PROVIDER_SITE_OTHER): Payer: BLUE CROSS/BLUE SHIELD | Admitting: Vascular Surgery

## 2018-02-23 ENCOUNTER — Encounter (INDEPENDENT_AMBULATORY_CARE_PROVIDER_SITE_OTHER): Payer: Medicaid Other

## 2018-02-26 ENCOUNTER — Telehealth: Payer: Self-pay | Admitting: Gastroenterology

## 2018-02-26 NOTE — Telephone Encounter (Signed)
Pt is calling she states  Her stomach hurts real bad and she wants to know if she is being referred to [facility] please call her

## 2018-02-28 NOTE — Telephone Encounter (Signed)
1. Enquire if she is taking her antibiotics 2. Yes we can refer to [facility]

## 2018-03-08 NOTE — Telephone Encounter (Signed)
Referral has been sent to [facility].

## 2018-03-23 NOTE — Telephone Encounter (Signed)
Thank you for the update and calling to see how she is. I hope [facility] can get her in sooner.

## 2018-03-23 NOTE — Telephone Encounter (Signed)
Spoke with patient. She states that it failed, her GI team is sending her to [place] but they cannot see her until May. She is considering going to [facility] instead. She states this is just one of a number of problems right now (neck issues, hyponatremia). She appreciated you trying, is glad for the outreach. Landis Gandy, RN

## 2018-03-23 NOTE — Telephone Encounter (Signed)
Would you mind giving Jenny Lee a call to check on how she is doing? I would assume she will be submitting another stool sample to her GI team soon following treatment with the rifabutin-based treatment for her HPylori infection.   Thank you!

## 2018-03-29 ENCOUNTER — Other Ambulatory Visit: Payer: Self-pay | Admitting: Family

## 2018-03-29 DIAGNOSIS — M5010 Cervical disc disorder with radiculopathy, unspecified cervical region: Secondary | ICD-10-CM

## 2018-04-14 ENCOUNTER — Ambulatory Visit
Admission: RE | Admit: 2018-04-14 | Discharge: 2018-04-14 | Disposition: A | Discharge status: Home / Self Care | Payer: BLUE CROSS/BLUE SHIELD | Source: Ambulatory Visit | Attending: Family | Admitting: Family

## 2018-04-14 DIAGNOSIS — M47812 Spondylosis without myelopathy or radiculopathy, cervical region: Secondary | ICD-10-CM | POA: Insufficient documentation

## 2018-04-14 DIAGNOSIS — M5010 Cervical disc disorder with radiculopathy, unspecified cervical region: Secondary | ICD-10-CM | POA: Diagnosis not present

## 2018-04-14 DIAGNOSIS — M79601 Pain in right arm: Secondary | ICD-10-CM | POA: Insufficient documentation

## 2018-04-14 DIAGNOSIS — M4802 Spinal stenosis, cervical region: Secondary | ICD-10-CM | POA: Diagnosis not present

## 2018-04-15 ENCOUNTER — Ambulatory Visit
Admission: RE | Admit: 2018-04-15 | Discharge: 2018-04-15 | Disposition: A | Discharge status: Home / Self Care | Payer: Self-pay | Source: Ambulatory Visit | Attending: Family | Admitting: Family

## 2018-04-15 ENCOUNTER — Other Ambulatory Visit: Payer: Self-pay | Admitting: Family

## 2018-04-15 DIAGNOSIS — M542 Cervicalgia: Secondary | ICD-10-CM

## 2018-06-01 IMAGING — US US RENAL ARTERY STENOSIS
1 series · 13 of 25 positions shown · non-contrast
Comparison: Prior abdominal ultrasound 02/11/2016

CLINICAL DATA: 60-year-old female with hypertension

EXAM:
RENAL/URINARY TRACT ULTRASOUND
RENAL DUPLEX DOPPLER ULTRASOUND

[Series 1: us renal artery stenosis · 0.20mm/px · 13 of 68 slices shown]
[im 1/68]
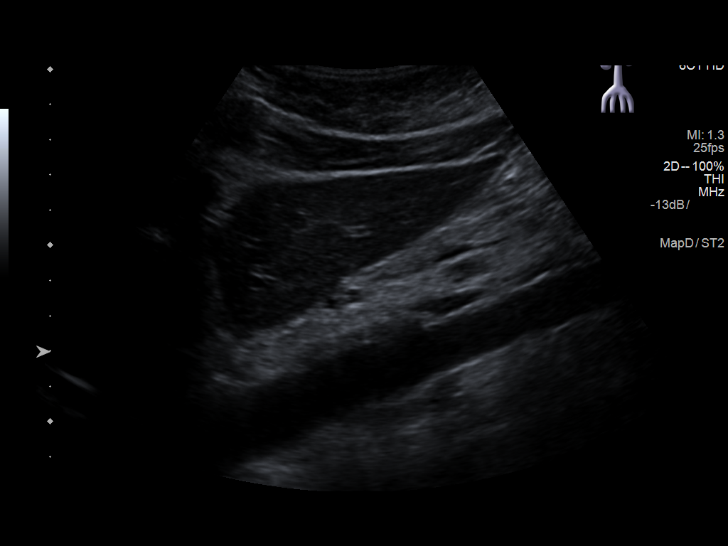
[im 6/68]
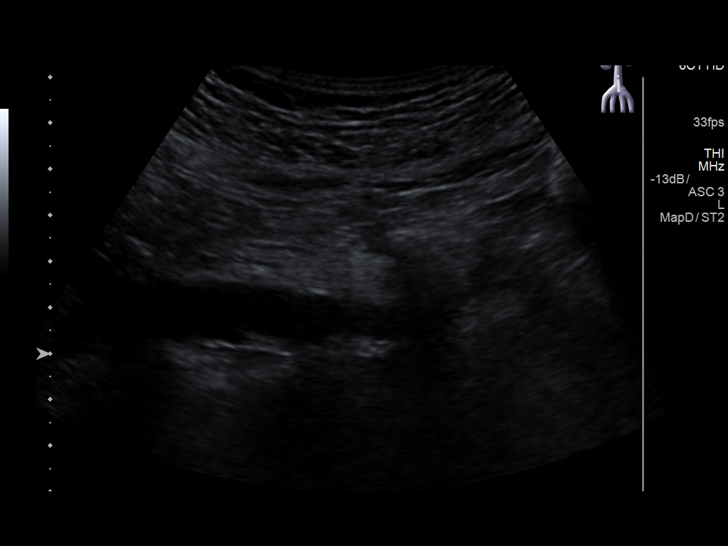
[im 12/68]
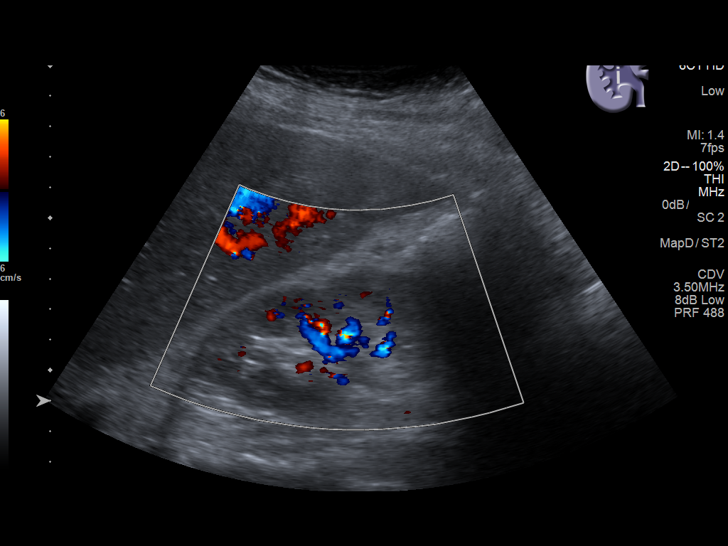
[im 17/68]
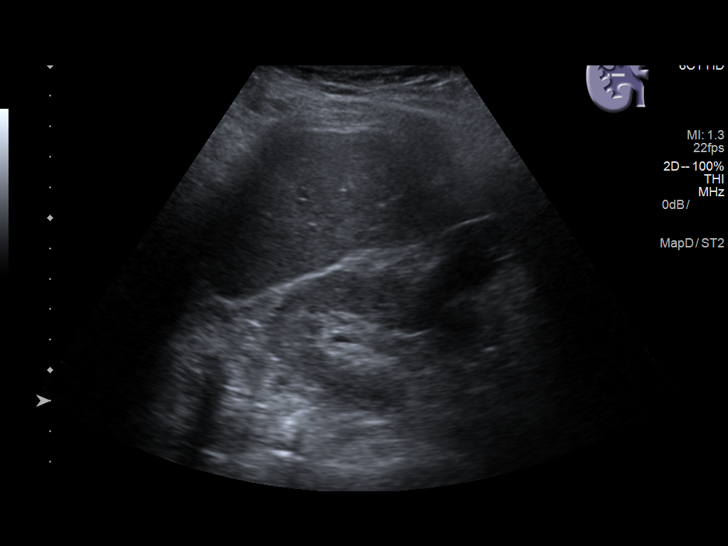
[im 23/68]
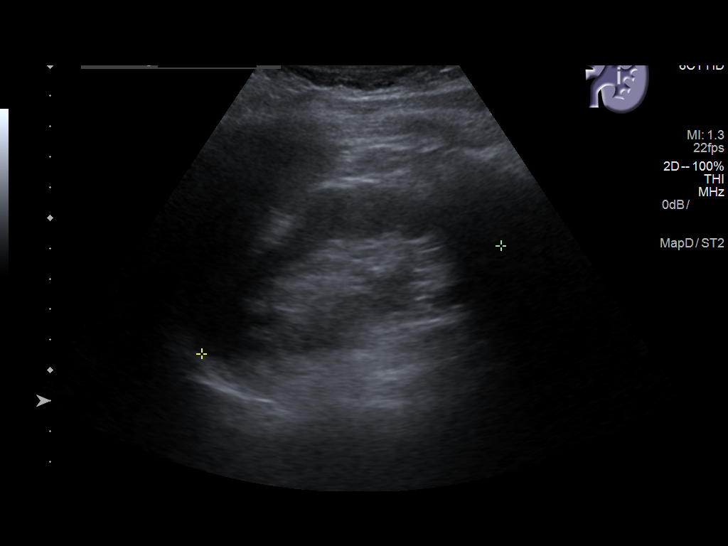
[im 28/68]
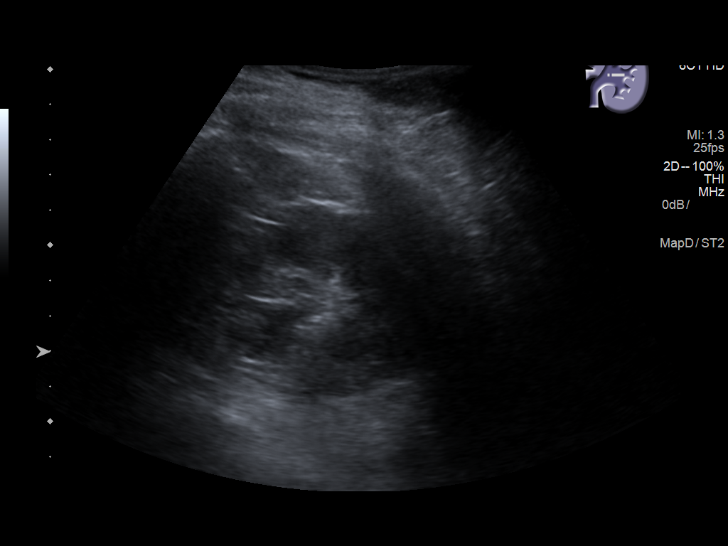
[im 34/68]
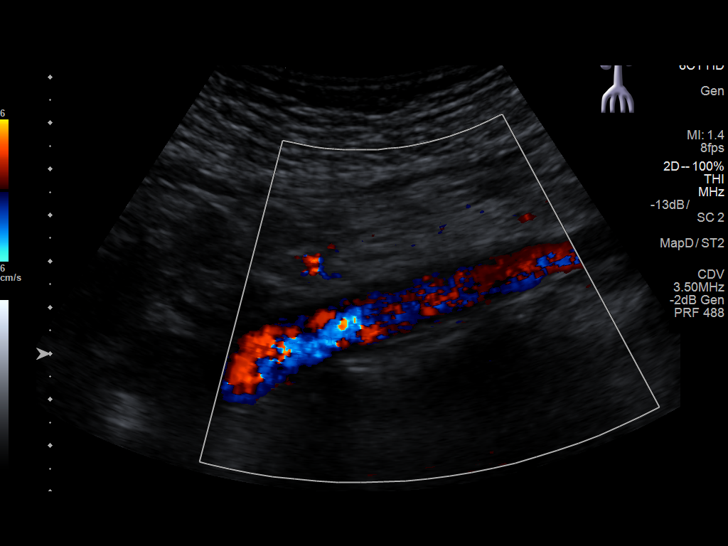
[im 40/68]
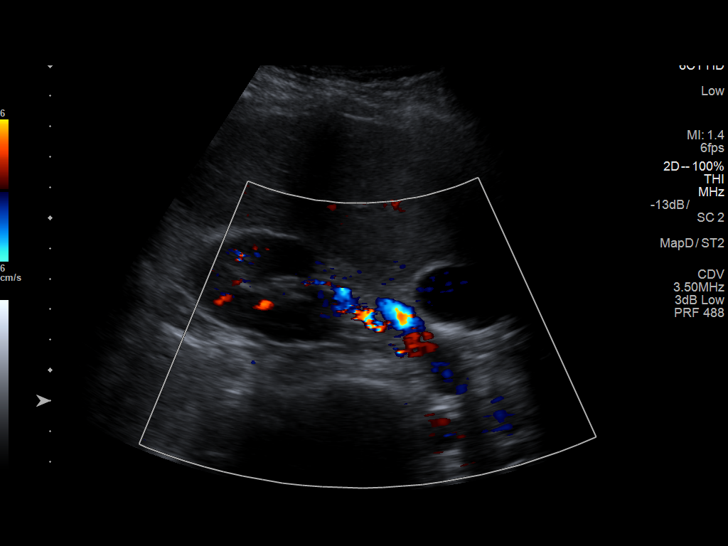
[im 45/68]
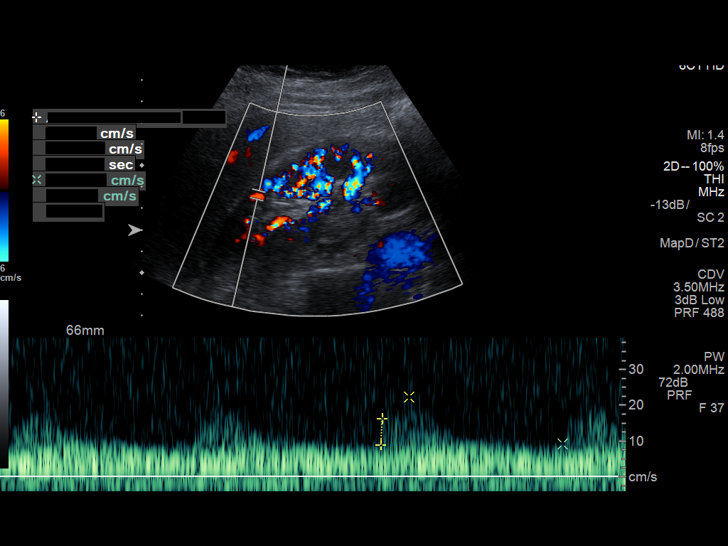
[im 51/68]
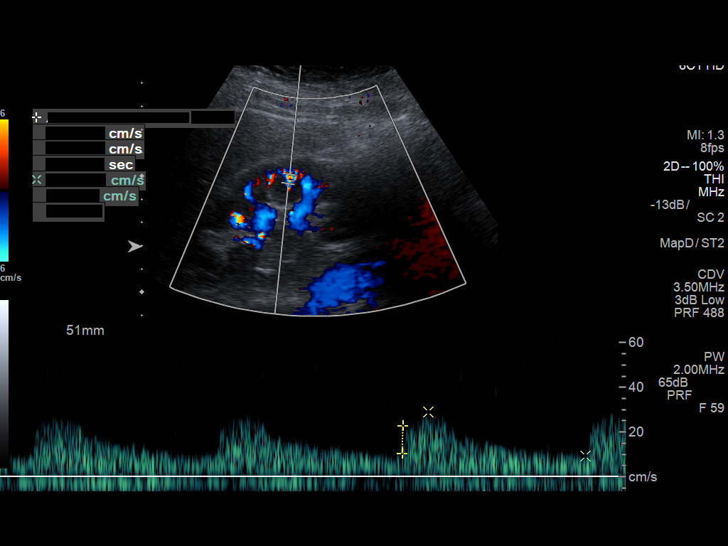
[im 56/68]
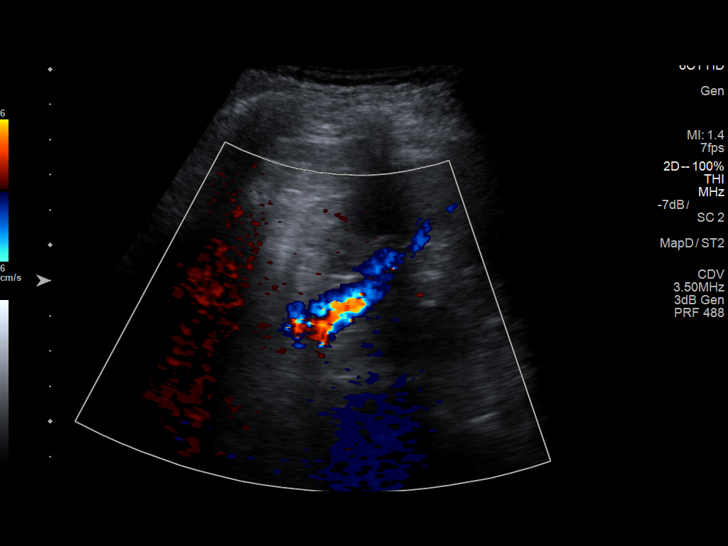
[im 62/68]
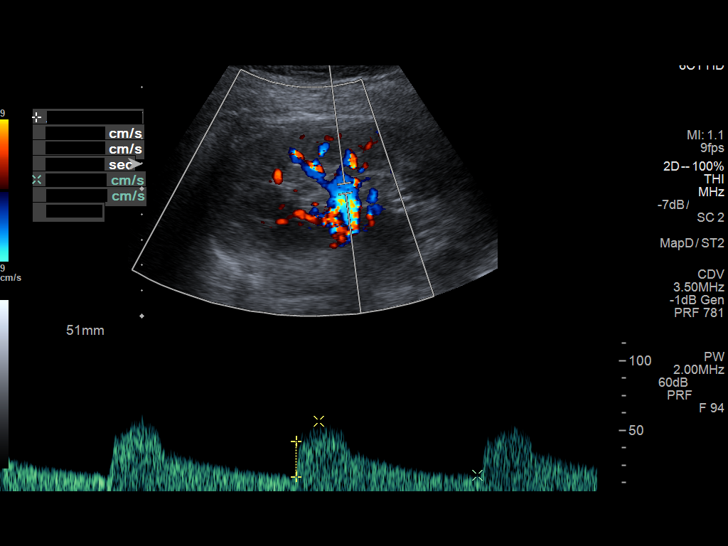
[im 68/68]
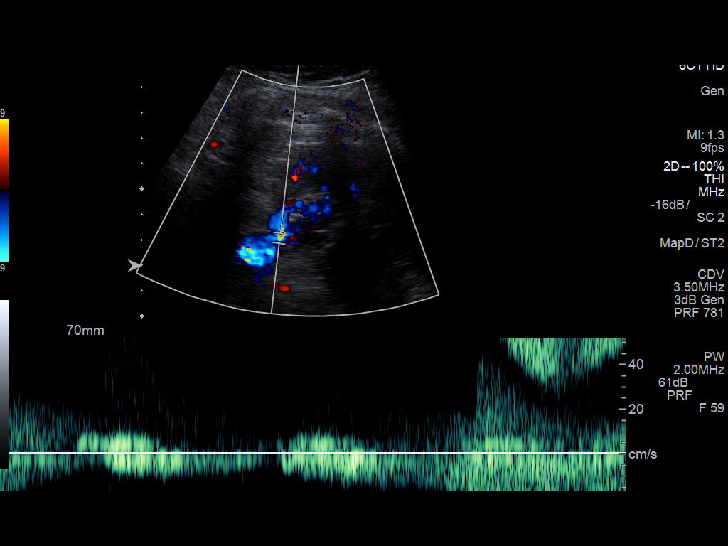

[13 of 25 positions shown; findings below may reference images not displayed]

FINDINGS: Right Kidney:

Length: 10.8 cm. Echogenicity within normal limits. No mass or
hydronephrosis visualized.

Left Kidney:

Length: 10.4 cm. Echogenicity within normal limits. No mass or
hydronephrosis visualized.

Bladder:  Within normal limits.

RENAL DUPLEX ULTRASOUND

Right Renal Artery Velocities:

Origin:  184 cm/sec

Mid:  205 cm/sec

Hilum:  105 cm/sec

Interlobar:  52 cm/sec

Arcuate:  29 Cm/sec

Left Renal Artery Velocities:

Origin:  188 cm/sec

Mid:  182 cm/sec

Hilum:  187 cm/sec

Interlobar:  57 cm/sec

Arcuate:  24 cm/sec

Aortic Velocity:  142 Cm/sec

Right Renal-Aortic Ratios:

Origin:

Mid:

Hilum:

Interlobar:

Arcuate:

Left Renal-Aortic Ratios:

Origin:

Mid:

Hilum:

Interlobar:

Arcuate:
IMPRESSION: 1. Diffusely increased peak systolic velocities in the aorta and
bilateral renal arteries which limits sensitivity for the detection
of renal artery stenosis.
2. While there is an isolated measurement of greater than 200 cm/sec
in the right mid renal artery, the associated renal artery ratio is
only 1.4 due to the elevation in the velocity within the aorta
itself. Findings are therefore indeterminate for renal artery
stenosis. If clinical concern persists, consider further evaluation
with MRA of the abdomen.
3. Normal sonographic appearance of the kidneys.

## 2018-06-10 ENCOUNTER — Other Ambulatory Visit: Payer: Self-pay | Admitting: Nephrology

## 2018-06-10 ENCOUNTER — Ambulatory Visit: Admit: 2018-06-10 | Payer: BLUE CROSS/BLUE SHIELD

## 2018-06-10 DIAGNOSIS — E871 Hypo-osmolality and hyponatremia: Secondary | ICD-10-CM

## 2018-06-21 ENCOUNTER — Telehealth: Payer: Self-pay | Admitting: Gastroenterology

## 2018-06-21 NOTE — Telephone Encounter (Signed)
PT left message on VM.  I called said she has been referred to [facility] but cannot wait until May.  Her stomach is bothering her real bad.  Is there anything we can do to get her seen sooner or maybe send her to [place].

## 2018-07-01 NOTE — Telephone Encounter (Signed)
Enquire how her constipation is doing ?, ensure on PPI, carafate ,Bentyl,IB guard

## 2018-07-08 NOTE — Telephone Encounter (Signed)
Give her some Trulance and I can see her in the office next week or week after

## 2018-07-15 ENCOUNTER — Ambulatory Visit: Payer: BLUE CROSS/BLUE SHIELD | Admitting: Gastroenterology

## 2018-07-15 ENCOUNTER — Encounter: Payer: Self-pay | Admitting: Gastroenterology

## 2018-07-15 ENCOUNTER — Other Ambulatory Visit: Payer: Self-pay

## 2018-07-15 VITALS — BP 132/79 | HR 76 | Ht 64.0 in | Wt 171.2 lb

## 2018-07-15 DIAGNOSIS — K5900 Constipation, unspecified: Secondary | ICD-10-CM

## 2018-07-15 DIAGNOSIS — A048 Other specified bacterial intestinal infections: Secondary | ICD-10-CM | POA: Diagnosis not present

## 2018-07-15 DIAGNOSIS — R1013 Epigastric pain: Secondary | ICD-10-CM

## 2018-07-15 NOTE — Progress Notes (Signed)
Jonathon Bellows MD, MRCP(U.K) [address]  [place]  [place], [place] [address]  Main: [phone]  Fax: [phone]   Primary Care Physician: Perrin Maltese, MD  Primary Gastroenterologist:  Dr. Jonathon Bellows   No chief complaint on file.   HPI: Jenny Lee is a 63 y.o. female    Summary of history :  She was last seen 0/2019 for abdominal pain. Known to have had gall stones, LUQ pain , worse at night . Dr Allen Norris felt the pain was musculoskeletal in nature. Seen by Dr Burt Knack who felt surgery would not be recommended. The patient was subsequently scheduled for an EGD+colonoscopy that I performed on 12/23/16 .   Colonoscopy : poor prep - aborted procedure due to solid stool- did take the prep  EGD: Treated for H pylori with Pylera([failed rx in 11/2016 with clarithromycin based therapy ) , gastric intestinal metaplasia noted.  MR abdomen 02/2017 normal HIDA scan -normal 06/04/17- H pylori stool antigen positive 06/26/17 - Colonoscopy - Tubular adenomas x 2 resected  Took 14 days of levofloxacin based therapyand did not clear either . Then treated with combination of clarithromycin, Amoxicillin , Flagyl, PPI. Referred to ID, they suggested EGD+biopsies and check for culture and sensitivity. EGD+ cultures showed no H pylori but repeat stool antigen test was positive, discussed with ID and plan was to perform H pylori breath testing.   Interval history 01/26/18-06/2018  01/2018 - H pylori breath test positive.  Referred to [facility] and awaiting to be seen. Given rifabutin based therapy.   She called our office a few weeks back for constipation  Seeing a neurosurgeon for her back . She says that has issues with constipation . Ib guard helps a bit. Trulance did help a bit but not much . Gaining weight  , on protonix    Current Outpatient Medications  Medication Sig Dispense Refill  . ALPRAZolam (XANAX) 0.25 MG tablet   0  . aspirin 81 MG tablet Take 81 mg by mouth daily.     Marland Kitchen atorvastatin (LIPITOR) 40 MG tablet   0  . carvedilol (COREG) 12.5 MG tablet Take 12.5 mg by mouth 2 (two) times daily with a meal.    . chlorthalidone (HYGROTON) 25 MG tablet Take 12.5 mg by mouth every morning.     . clonazePAM (KLONOPIN) 0.5 MG tablet   1  . dicyclomine (BENTYL) 10 MG capsule Take 10 mg by mouth 4 (four) times daily -  before meals and at bedtime.    . enalapril (VASOTEC) 20 MG tablet Take 20 mg by mouth 2 (two) times daily.     Marland Kitchen estradiol (ESTRACE) 0.5 MG tablet Take 0.5 mg by mouth daily.    Marland Kitchen linaclotide (LINZESS) 290 MCG CAPS capsule Take 290 mcg by mouth daily before breakfast.    . Melatonin 10 MG TABS Take 10 mg by mouth at bedtime.     . naproxen sodium (ALEVE) 220 MG tablet Take 220 mg by mouth daily as needed.    . ondansetron (ZOFRAN) 4 MG tablet Take 4 mg by mouth every 8 (eight) hours as needed for nausea.   0  . pantoprazole (PROTONIX) 40 MG tablet 40 mg.  2  . sertraline (ZOLOFT) 100 MG tablet Take 1 tablet by mouth daily.   10  . Vitamin D, Ergocalciferol, (DRISDOL) 50000 units CAPS capsule   1   No current facility-administered medications for this visit.     Allergies as of 07/15/2018  . (  No Known Allergies)    ROS:  General: Negative for anorexia, weight loss, fever, chills, fatigue, weakness. ENT: Negative for hoarseness, difficulty swallowing , nasal congestion. CV: Negative for chest pain, angina, palpitations, dyspnea on exertion, peripheral edema.  Respiratory: Negative for dyspnea at rest, dyspnea on exertion, cough, sputum, wheezing.  GI: See history of present illness. GU:  Negative for dysuria, hematuria, urinary incontinence, urinary frequency, nocturnal urination.  Endo: Negative for unusual weight change.    Physical Examination:   There were no vitals taken for this visit.  General: Well-nourished, well-developed in no acute distress.  Eyes: No icterus. Conjunctivae pink. Mouth: Oropharyngeal mucosa moist and pink , no  lesions erythema or exudate. Lungs: Clear to auscultation bilaterally. Non-labored. Heart: Regular rate and rhythm, no murmurs rubs or gallops.  Abdomen: Bowel sounds are normal, mild epigastric tenderness, nondistended, no hepatosplenomegaly or masses, no abdominal bruits or hernia , no rebound or guarding.   Extremities: No lower extremity edema. No clubbing or deformities. Neuro: Alert and oriented x 3.  Grossly intact. Skin: Warm and dry, no jaundice.   Psych: Alert and cooperative, normal mood and affect.   Imaging Studies: No results found.  Assessment and Plan:   Jenny Lee is a 63 y.o. y/o female here to follow up for constipation and H pylori which has failed multiple lines of therapy. Seen by ID and advised for EGD+biopsies+culture and sensitivities Gastric biopsies with culture was negative for H pylori but subsequent stool antigen testing was positive. Decided to proceed with breath testing which also been positive.Presently having issues with constipation  .    Plan  1. Gastric mapping for intestinal metaplasia in 6 months after eradication of H pylori by [facility].  2. Refer to [facility] as [facility] is out of network for management of H pylori which has failed multiple lines of therapy.  3. Motegrity samples for constipation  4. IB guard for pain.  5. Continue protonix 6. . High fiber diet    Dr Jonathon Bellows  MD,MRCP [facility]) Follow up PRN

## 2018-07-15 NOTE — Patient Instructions (Signed)

## 2018-07-22 ENCOUNTER — Telehealth: Payer: Self-pay | Admitting: Gastroenterology

## 2018-07-22 NOTE — Telephone Encounter (Signed)
Spoke with pt and explained to her that Jenny Lee will contact her to schedule once her referral has been processed, which could take 2-3 weeks. Pt understands.

## 2018-07-22 NOTE — Telephone Encounter (Signed)
Patient is calling to check she has not heard anything about her [facility] Referral.

## 2018-07-28 ENCOUNTER — Other Ambulatory Visit: Payer: Self-pay | Admitting: Orthopedic Surgery

## 2018-07-28 DIAGNOSIS — M249 Joint derangement, unspecified: Secondary | ICD-10-CM

## 2018-08-05 ENCOUNTER — Ambulatory Visit: Admission: RE | Admit: 2018-08-05 | Payer: BLUE CROSS/BLUE SHIELD | Source: Ambulatory Visit

## 2018-08-10 ENCOUNTER — Ambulatory Visit: Admission: RE | Admit: 2018-08-10 | Payer: BLUE CROSS/BLUE SHIELD | Source: Ambulatory Visit

## 2018-08-10 ENCOUNTER — Ambulatory Visit: Payer: BLUE CROSS/BLUE SHIELD

## 2018-08-16 ENCOUNTER — Ambulatory Visit
Admission: RE | Admit: 2018-08-16 | Discharge: 2018-08-16 | Disposition: A | Discharge status: Home / Self Care | Payer: BLUE CROSS/BLUE SHIELD | Source: Ambulatory Visit | Attending: Orthopedic Surgery | Admitting: Orthopedic Surgery

## 2018-08-16 ENCOUNTER — Ambulatory Visit: Payer: BLUE CROSS/BLUE SHIELD

## 2018-08-16 ENCOUNTER — Other Ambulatory Visit: Payer: Self-pay

## 2018-08-16 DIAGNOSIS — M249 Joint derangement, unspecified: Secondary | ICD-10-CM | POA: Insufficient documentation

## 2018-08-16 LAB — POCT I-STAT CREATININE: CREATININE: 0.9 mg/dL (ref 0.44–1.00)

## 2018-08-16 MED ORDER — GADOBUTROL 1 MMOL/ML IV SOLN
7.5000 mL | Freq: Once | INTRAVENOUS | Status: AC | PRN
  Administered 2018-08-16: 7.5 mL via INTRAVENOUS

## 2018-08-19 ENCOUNTER — Other Ambulatory Visit: Payer: Self-pay | Admitting: Orthopedic Surgery

## 2018-08-19 DIAGNOSIS — M249 Joint derangement, unspecified: Secondary | ICD-10-CM

## 2018-09-15 ENCOUNTER — Ambulatory Visit
Admission: RE | Admit: 2018-09-15 | Discharge: 2018-09-15 | Disposition: A | Discharge status: Home / Self Care | Payer: BLUE CROSS/BLUE SHIELD | Source: Ambulatory Visit | Attending: Orthopedic Surgery | Admitting: Orthopedic Surgery

## 2018-09-15 ENCOUNTER — Other Ambulatory Visit: Payer: Self-pay | Admitting: Orthopedic Surgery

## 2018-09-15 ENCOUNTER — Other Ambulatory Visit: Payer: Self-pay

## 2018-09-15 DIAGNOSIS — M249 Joint derangement, unspecified: Secondary | ICD-10-CM | POA: Diagnosis not present

## 2018-09-15 MED ORDER — IOHEXOL 300 MG/ML  SOLN
75.0000 mL | Freq: Once | INTRAMUSCULAR | Status: AC | PRN
  Administered 2018-09-15: 75 mL via INTRAVENOUS

## 2018-11-18 ENCOUNTER — Telehealth: Payer: Self-pay | Admitting: Gastroenterology

## 2018-11-18 NOTE — Telephone Encounter (Signed)
Patient would like to see Dr Vicente Males & see if we can work her in before she leaves the state.

## 2018-11-18 NOTE — Telephone Encounter (Signed)
Patient was referred to [facility] & she was unable to go. They do not accept her insurance. Please refer back to [place].Patient will be out of state from 12-19-18 for a month or more.

## 2018-11-22 NOTE — Telephone Encounter (Signed)
Spoke with pt and reminded her that we switched her referral to [facility] back in February this year. I spoke with [facility] they have been trying to contact pt but have been unsuccessful. [facility] states pt will now have to contact their office to schedule. I have given pt [facility] contact information.

## 2018-12-08 DIAGNOSIS — K5900 Constipation, unspecified: Secondary | ICD-10-CM | POA: Insufficient documentation

## 2019-02-01 ENCOUNTER — Other Ambulatory Visit: Payer: Self-pay | Admitting: Unknown Physician Specialty

## 2019-02-01 ENCOUNTER — Other Ambulatory Visit (HOSPITAL_COMMUNITY): Payer: Self-pay | Admitting: Unknown Physician Specialty

## 2019-02-01 DIAGNOSIS — M47812 Spondylosis without myelopathy or radiculopathy, cervical region: Secondary | ICD-10-CM

## 2019-02-01 DIAGNOSIS — M542 Cervicalgia: Secondary | ICD-10-CM

## 2019-02-10 ENCOUNTER — Other Ambulatory Visit: Payer: Self-pay | Admitting: Unknown Physician Specialty

## 2019-02-11 ENCOUNTER — Ambulatory Visit: Payer: BLUE CROSS/BLUE SHIELD

## 2019-06-21 ENCOUNTER — Other Ambulatory Visit: Payer: Self-pay | Admitting: Internal Medicine

## 2019-06-21 DIAGNOSIS — Z1231 Encounter for screening mammogram for malignant neoplasm of breast: Secondary | ICD-10-CM

## 2019-07-19 ENCOUNTER — Ambulatory Visit
Admission: RE | Admit: 2019-07-19 | Discharge: 2019-07-19 | Disposition: A | Discharge status: Home / Self Care | Payer: BLUE CROSS/BLUE SHIELD | Source: Ambulatory Visit | Attending: Internal Medicine | Admitting: Internal Medicine

## 2019-07-19 DIAGNOSIS — Z1231 Encounter for screening mammogram for malignant neoplasm of breast: Secondary | ICD-10-CM | POA: Insufficient documentation

## 2019-08-20 ENCOUNTER — Emergency Department
Admission: EM | Admit: 2019-08-20 | Discharge: 2019-08-20 | Disposition: A | Discharge status: Home / Self Care | Payer: BLUE CROSS/BLUE SHIELD | Attending: Emergency Medicine | Admitting: Emergency Medicine

## 2019-08-20 ENCOUNTER — Other Ambulatory Visit: Payer: Self-pay

## 2019-08-20 ENCOUNTER — Emergency Department: Payer: BLUE CROSS/BLUE SHIELD

## 2019-08-20 DIAGNOSIS — Y939 Activity, unspecified: Secondary | ICD-10-CM | POA: Diagnosis not present

## 2019-08-20 DIAGNOSIS — Z7982 Long term (current) use of aspirin: Secondary | ICD-10-CM | POA: Insufficient documentation

## 2019-08-20 DIAGNOSIS — Y929 Unspecified place or not applicable: Secondary | ICD-10-CM | POA: Insufficient documentation

## 2019-08-20 DIAGNOSIS — Z87891 Personal history of nicotine dependence: Secondary | ICD-10-CM | POA: Insufficient documentation

## 2019-08-20 DIAGNOSIS — X509XXA Other and unspecified overexertion or strenuous movements or postures, initial encounter: Secondary | ICD-10-CM | POA: Insufficient documentation

## 2019-08-20 DIAGNOSIS — Z79899 Other long term (current) drug therapy: Secondary | ICD-10-CM | POA: Diagnosis not present

## 2019-08-20 DIAGNOSIS — I2510 Atherosclerotic heart disease of native coronary artery without angina pectoris: Secondary | ICD-10-CM | POA: Diagnosis not present

## 2019-08-20 DIAGNOSIS — Y999 Unspecified external cause status: Secondary | ICD-10-CM | POA: Insufficient documentation

## 2019-08-20 DIAGNOSIS — I10 Essential (primary) hypertension: Secondary | ICD-10-CM | POA: Insufficient documentation

## 2019-08-20 DIAGNOSIS — S4991XA Unspecified injury of right shoulder and upper arm, initial encounter: Secondary | ICD-10-CM | POA: Diagnosis present

## 2019-08-20 DIAGNOSIS — S46911A Strain of unspecified muscle, fascia and tendon at shoulder and upper arm level, right arm, initial encounter: Secondary | ICD-10-CM | POA: Diagnosis not present

## 2019-08-20 MED ORDER — HYDROMORPHONE HCL 1 MG/ML IJ SOLN
1.0000 mg | Freq: Once | INTRAMUSCULAR | Status: AC
  Administered 2019-08-20: 1 mg via INTRAMUSCULAR
  Filled 2019-08-20: qty 1

## 2019-08-20 MED ORDER — ORPHENADRINE CITRATE 30 MG/ML IJ SOLN
60.0000 mg | Freq: Two times a day (BID) | INTRAMUSCULAR | Status: DC
  Administered 2019-08-20: 60 mg via INTRAMUSCULAR
  Filled 2019-08-20: qty 2

## 2019-08-20 MED ORDER — TRAMADOL HCL 50 MG PO TABS: 50.0000 mg | ORAL_TABLET | Freq: Four times a day (QID) | ORAL | 0 refills | Status: AC | PRN

## 2019-08-20 MED ORDER — CYCLOBENZAPRINE HCL 10 MG PO TABS: 10.0000 mg | ORAL_TABLET | Freq: Three times a day (TID) | ORAL | 0 refills | Status: DC | PRN

## 2019-08-20 NOTE — ED Notes (Signed)
First Nurse Note: Pt to ED via [place] stating that she thinks her shoulder may be out of place. Pt is moving arm without difficulty. Pt is in NAD.

## 2019-08-20 NOTE — ED Provider Notes (Signed)
[facility] Emergency Department Provider Note   ____________________________________________   First MD Initiated Contact with Patient 08/20/19 1223     (approximate)  I have reviewed the triage vital signs and the nursing notes.   HISTORY  Chief Complaint Shoulder Pain    HPI Jenny Lee is a 64 y.o. female patient complain of right posterior muscle shoulder pain secondary to an overextension incident.  Patient denies loss sensation or loss of motion.  Patient state spasms in the posterior shoulder near the scapulary area.  Patient rates pain as a 9/10.  No palliative measures for complaint.         Past Medical History:  Diagnosis Date  . Anxiety   . CAD (coronary artery disease)   . Female genuine stress incontinence 08/15/2014  . GERD (gastroesophageal reflux disease)   . H/O acute myocardial infarction 08/15/2014   Overview:  2002   . Heart attack ([place])    2001  . HLD (hyperlipidemia) 12/20/2015  . HTN (hypertension) 12/20/2015  . Hx of cardiac cath 12/11/2015   Overview:  Normal coronary arteries 2017  . Hypercholesteremia   . Hypertension   . Mastalgia 10/06/2012  . 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 Tachycardia 12/20/2015  . Takotsubo cardiomyopathy     Patient Active Problem List   Diagnosis Date Noted  . H. pylori infection 11/20/2017  . Special screening for malignant neoplasms, colon   . Polyp of sigmoid colon   . Diverticulosis of large intestine without diverticulitis   . Benign neoplasm of ascending colon   . Benign neoplasm of cecum   . 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 Renal artery stenosis ([place]) 02/24/2017  . Tachycardia 12/20/2015  . Takotsubo cardiomyopathy 12/20/2015  . HTN (hypertension) 12/20/2015  . HLD (hyperlipidemia) 12/20/2015  . GERD (gastroesophageal reflux disease) 12/20/2015  . Hx of cardiac cath 12/11/2015  . Syncope 11/26/2015  . H/O acute myocardial infarction  08/15/2014  . Encounter for other preprocedural examination 08/15/2014  . Female genuine stress incontinence 08/15/2014  . 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 Mastalgia 10/06/2012    Past Surgical History:  Procedure Laterality Date  . ABDOMINAL HYSTERECTOMY  2004  . CARDIAC CATHETERIZATION N/A 11/26/2015   Procedure: Left Heart Cath and Coronary Angiography;  Surgeon: Dionisio David, MD;  Location: [place] CV LAB;  Service: Cardiovascular;  Laterality: N/A;  . COLONOSCOPY WITH PROPOFOL N/A 12/23/2016   Procedure: COLONOSCOPY WITH PROPOFOL;  Surgeon: Jonathon Bellows, MD;  Location: [facility] ENDOSCOPY;  Service: Endoscopy;  Laterality: N/A;  . COLONOSCOPY WITH PROPOFOL N/A 06/26/2017   Procedure: COLONOSCOPY WITH PROPOFOL;  Surgeon: Virgel Manifold, MD;  Location: [facility] ENDOSCOPY;  Service: Gastroenterology;  Laterality: N/A;  . ESOPHAGOGASTRODUODENOSCOPY (EGD) WITH PROPOFOL N/A 12/23/2016   Procedure: ESOPHAGOGASTRODUODENOSCOPY (EGD) WITH PROPOFOL;  Surgeon: Jonathon Bellows, MD;  Location: [facility] ENDOSCOPY;  Service: Endoscopy;  Laterality: N/A;  . ESOPHAGOGASTRODUODENOSCOPY (EGD) WITH PROPOFOL N/A 03/31/2017   Procedure: ESOPHAGOGASTRODUODENOSCOPY (EGD) WITH PROPOFOL WITH GASTRIC MAPPING;  Surgeon: Jonathon Bellows, MD;  Location: [facility] ENDOSCOPY;  Service: Gastroenterology;  Laterality: N/A;  . ESOPHAGOGASTRODUODENOSCOPY (EGD) WITH PROPOFOL N/A 12/21/2017   Procedure: ESOPHAGOGASTRODUODENOSCOPY (EGD) WITH PROPOFOL;  Surgeon: Jonathon Bellows, MD;  Location: [facility] ENDOSCOPY;  Service: Gastroenterology;  Laterality: N/A;  . NECK SURGERY  1998  . RENAL ANGIOGRAPHY Bilateral 03/02/2017   Procedure: RENAL ANGIOGRAPHY;  Surgeon: Algernon Huxley, MD;  Location: [place] CV LAB;  Service: Cardiovascular;  Laterality: Bilateral;  Prior to Admission medications   Medication Sig Start Date End Date Taking? Authorizing Provider  ALPRAZolam Duanne Moron) 0.25 MG tablet  04/24/17   [provider]  aspirin 81 MG tablet Take 81 mg by mouth daily.    [provider]  atorvastatin (LIPITOR) 40 MG tablet  04/20/17   [provider]  carvedilol (COREG) 12.5 MG tablet Take 12.5 mg by mouth 2 (two) times daily with a meal.    [provider]  chlorthalidone (HYGROTON) 25 MG tablet Take 12.5 mg by mouth every morning.     [provider]  clonazePAM [name]) 0.5 MG tablet  10/28/17   [provider]  cyclobenzaprine (FLEXERIL) 10 MG tablet Take 1 tablet (10 mg total) by mouth 3 (three) times daily as needed. 08/20/19   Sable Feil, PA-C  dicyclomine (BENTYL) 10 MG capsule Take 10 mg by mouth 4 (four) times daily -  before meals and at bedtime.    [provider]  enalapril (VASOTEC) 20 MG tablet Take 20 mg by mouth 2 (two) times daily.     [provider]  estradiol (ESTRACE) 0.5 MG tablet Take 0.5 mg by mouth daily.    [provider]  linaclotide (LINZESS) 290 MCG CAPS capsule Take 290 mcg by mouth daily before breakfast.    [provider]  Melatonin 10 MG TABS Take 10 mg by mouth at bedtime.     [provider]  naproxen sodium (ALEVE) 220 MG tablet Take 220 mg by mouth daily as needed.    [provider]  ondansetron (ZOFRAN) 4 MG tablet Take 4 mg by mouth every 8 (eight) hours as needed for nausea.  12/01/16   [provider]  pantoprazole (PROTONIX) 40 MG tablet 40 mg. 08/28/17   [provider]  sertraline (ZOLOFT) 100 MG tablet Take 1 tablet by mouth daily.  09/09/16   [provider]  traMADol (ULTRAM) 50 MG tablet Take 1 tablet (50 mg total) by mouth every 6 (six) hours as needed. 08/20/19 08/19/20  Sable Feil, PA-C  Vitamin D, Ergocalciferol, (DRISDOL) 50000 units CAPS capsule  11/06/17   [provider]    Allergies Patient has no known allergies.  Family History  Problem Relation Age of Onset  . Breast cancer Sister 31    Social  History Social History   Tobacco Use  . Smoking status: Former Smoker    Packs/day: 0.00    Years: 0.00    Pack years: 0.00  . Smokeless tobacco: Never Used  Substance Use Topics  . Alcohol use: Yes    Alcohol/week: 0.0 standard drinks    Comment: occasional wine  . Drug use: No    Review of Systems Constitutional: No fever/chills Eyes: No visual changes. ENT: No sore throat. Cardiovascular: Denies chest pain. Respiratory: Denies shortness of breath. Gastrointestinal: No abdominal pain.  No nausea, no vomiting.  No diarrhea.  No constipation. Genitourinary: Negative for dysuria. Musculoskeletal: Negative for back pain. Skin: Negative for rash. Neurological: Negative for headaches, focal weakness or numbness. Psychiatric:  Anxiety. Endocrine:  Hyperlipidemia and hypertension. ____________________________________________   PHYSICAL EXAM:  VITAL SIGNS: ED Triage Vitals  Enc Vitals Group     BP 08/20/19 1209 110/66     Pulse Rate 08/20/19 1209 81     Resp 08/20/19 1209 16     Temp 08/20/19 1209 98.2 F (36.8 C)     Temp Source 08/20/19 1209 Oral     SpO2  08/20/19 1209 97 %     Weight 08/20/19 1210 165 lb (74.8 kg)     Height 08/20/19 1210 5\' 5"  (1.651 m)     Head Circumference --      Peak Flow --      Pain Score 08/20/19 1210 9     Pain Loc --      Pain Edu? --      Excl. in [place]? --     Constitutional: Alert and oriented. Well appearing and in no acute distress.  Anxious. Eyes: Conjunctivae are normal. PERRL. EOMI. Neck: No cervical spine tenderness to palpation. Hematological/Lymphatic/Immunilogical: No cervical lymphadenopathy. Cardiovascular: Normal rate, regular rhythm. Grossly normal heart sounds.  Good peripheral circulation. Respiratory: Normal respiratory effort.  No retractions. Lungs CTAB. Musculoskeletal: No obvious deformity to there right shoulder.  Patient is moderate guarding palpation medial inferior aspect of the left scapular area.    Neurologic:  Normal speech and language. No gross focal neurologic deficits are appreciated. No gait instability. Skin:  Skin is warm, dry and intact. No rash noted. Psychiatric: Mood and affect are normal. Speech and behavior are normal.  ____________________________________________   LABS (all labs ordered are listed, but only abnormal results are displayed)  Labs Reviewed - No data to display ____________________________________________  EKG   ____________________________________________  RADIOLOGY  ED MD interpretation:    Official radiology report(s): DG Shoulder Right  Result Date: 08/20/2019 CLINICAL DATA:  Posterior right shoulder pain for 2 days. EXAM: RIGHT SHOULDER - 2+ VIEW COMPARISON:  None. FINDINGS: There is no evidence of fracture or dislocation. There is no evidence of arthropathy or other focal bone abnormality. Soft tissues are unremarkable. IMPRESSION: Negative. Electronically Signed   By: Lorriane Shire M.D.   On: 08/20/2019 14:06    ____________________________________________   PROCEDURES  Procedure(s) performed (including Critical Care):  Procedures   ____________________________________________   INITIAL IMPRESSION / ASSESSMENT AND PLAN / ED COURSE  As part of my medical decision making, I reviewed the following data within the [place]     Patient presents with right scapular muscle pain secondary to overextension incident today.  Discussed neck x-ray findings with patient.  Patient's  exam is consistent with right scapular muscle strain.  Patient given discharge care instruction advised take medication as directed.  Patient advised follow-up PCP.    LASHIRA DEFARIA was evaluated in Emergency Department on 08/20/2019 for the symptoms described in the history of present illness. She was evaluated in the context of the global COVID-19 pandemic, which necessitated consideration that the patient might be at risk for infection  with the SARS-CoV-2 virus that causes COVID-19. Institutional protocols and algorithms that pertain to the evaluation of patients at risk for COVID-19 are in a state of rapid change based on information released by regulatory bodies including the CDC and federal and state organizations. These policies and algorithms were followed during the patient's care in the ED.       ____________________________________________   FINAL CLINICAL IMPRESSION(S) / ED DIAGNOSES  Final diagnoses:  Muscle strain of right scapular region, initial encounter     ED Discharge Orders         Ordered    traMADol (ULTRAM) 50 MG tablet  Every 6 hours PRN     08/20/19 1414    cyclobenzaprine (FLEXERIL) 10 MG tablet  3 times daily PRN     08/20/19 1414           Note:  This document was  prepared using Systems analyst and may include unintentional dictation errors.    Sable Feil, PA-C 08/20/19 1419    Lavonia Drafts, MD 08/20/19 [phone]

## 2019-08-20 NOTE — ED Notes (Signed)
Pt denies injury. Was able to articulate the arm and point to her scapula where it hurts. No other complaints

## 2019-08-20 NOTE — Discharge Instructions (Addendum)
Follow discharge care instruction take medication as directed. °

## 2019-08-20 NOTE — ED Triage Notes (Signed)
Pt c/o of R posterior shoulder pain. Husband told her it looks swollen. Pt points to scapula to where pain hurts. Denies hitting anything or being hit. Denies heavy lifting. A&O, ambulatory. Moving R arm without any difficulty.

## 2019-10-17 DIAGNOSIS — M545 Low back pain, unspecified: Secondary | ICD-10-CM | POA: Insufficient documentation

## 2019-11-30 DIAGNOSIS — E871 Hypo-osmolality and hyponatremia: Secondary | ICD-10-CM | POA: Insufficient documentation

## 2019-11-30 DIAGNOSIS — R8281 Pyuria: Secondary | ICD-10-CM | POA: Insufficient documentation

## 2019-12-09 DIAGNOSIS — R109 Unspecified abdominal pain: Secondary | ICD-10-CM | POA: Insufficient documentation

## 2019-12-09 DIAGNOSIS — R432 Parageusia: Secondary | ICD-10-CM | POA: Insufficient documentation

## 2019-12-19 DIAGNOSIS — M19011 Primary osteoarthritis, right shoulder: Secondary | ICD-10-CM | POA: Insufficient documentation

## 2020-01-24 DIAGNOSIS — M1388 Other specified arthritis, other site: Secondary | ICD-10-CM | POA: Insufficient documentation

## 2020-05-07 IMAGING — CR DG SHOULDER 2+V*R*
3 series · 3 of 3 positions shown · non-contrast
Comparison: None.

CLINICAL DATA: Posterior right shoulder pain for 2 days.

EXAM:
RIGHT SHOULDER - 2+ VIEW

[shoulder grashey]
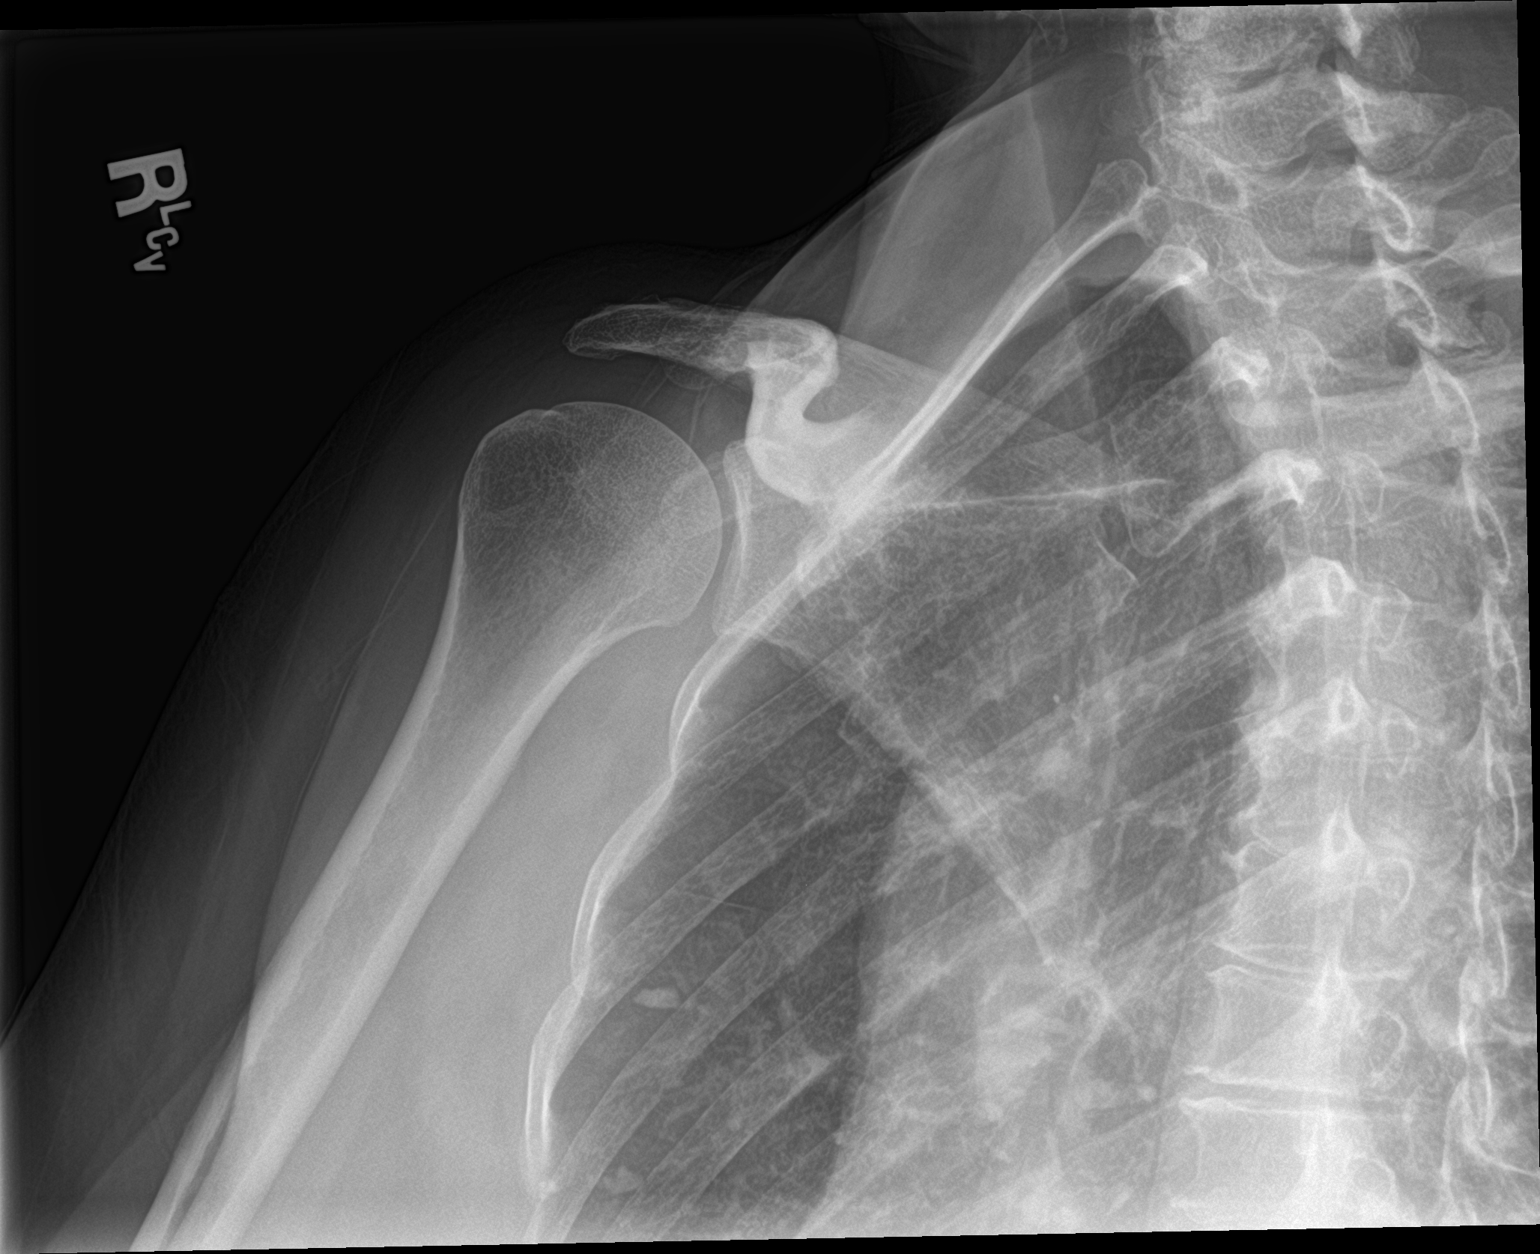

[shoulder y view]
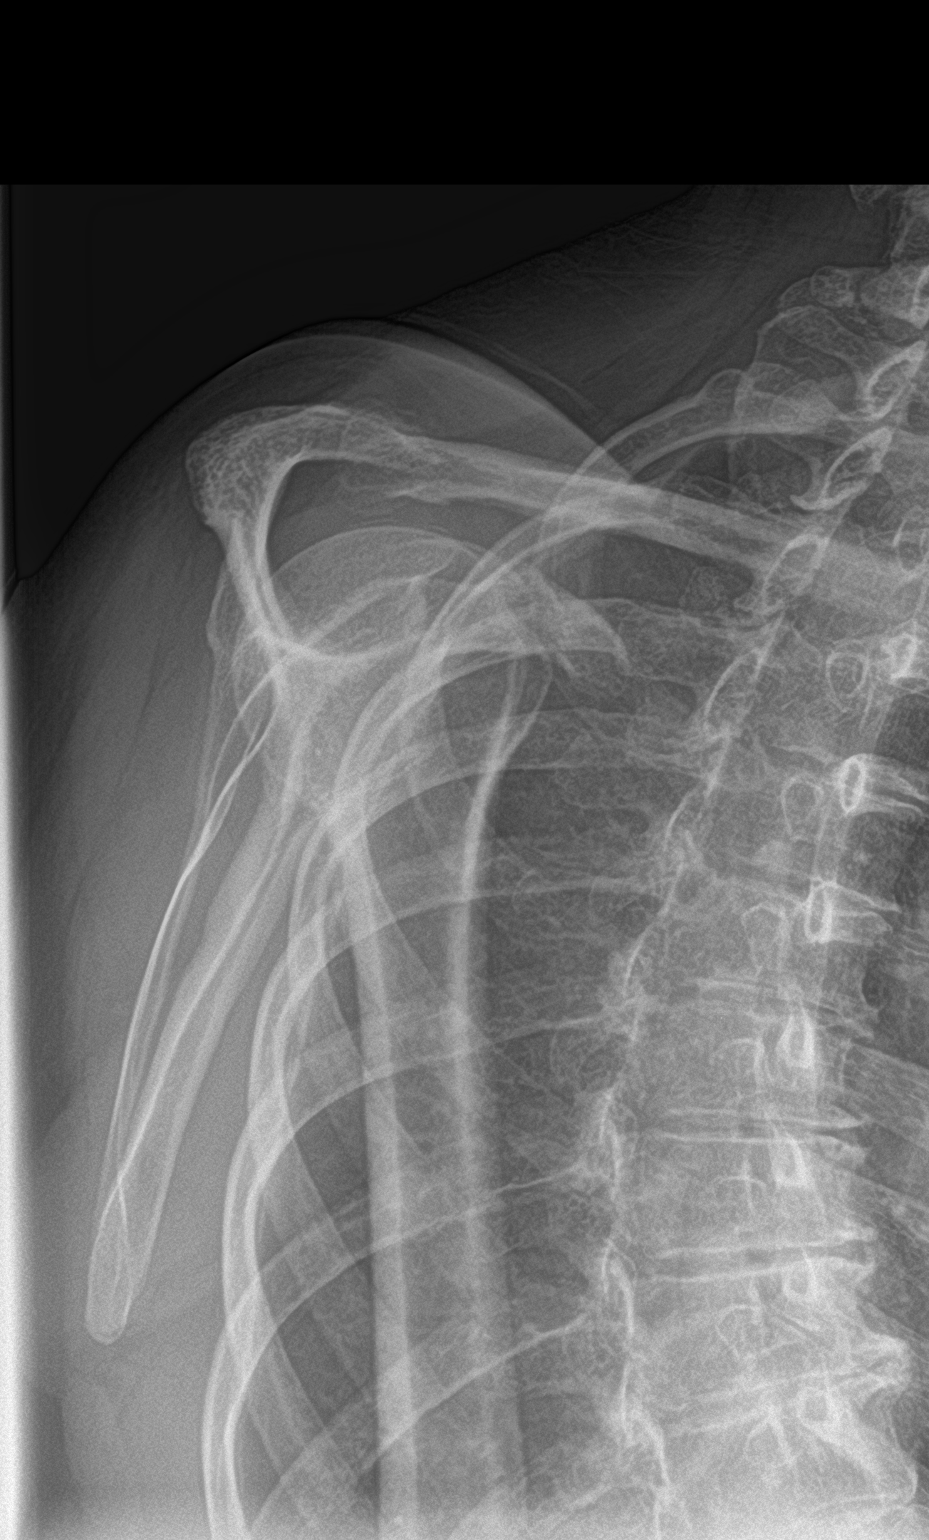

[shoulder axillary]
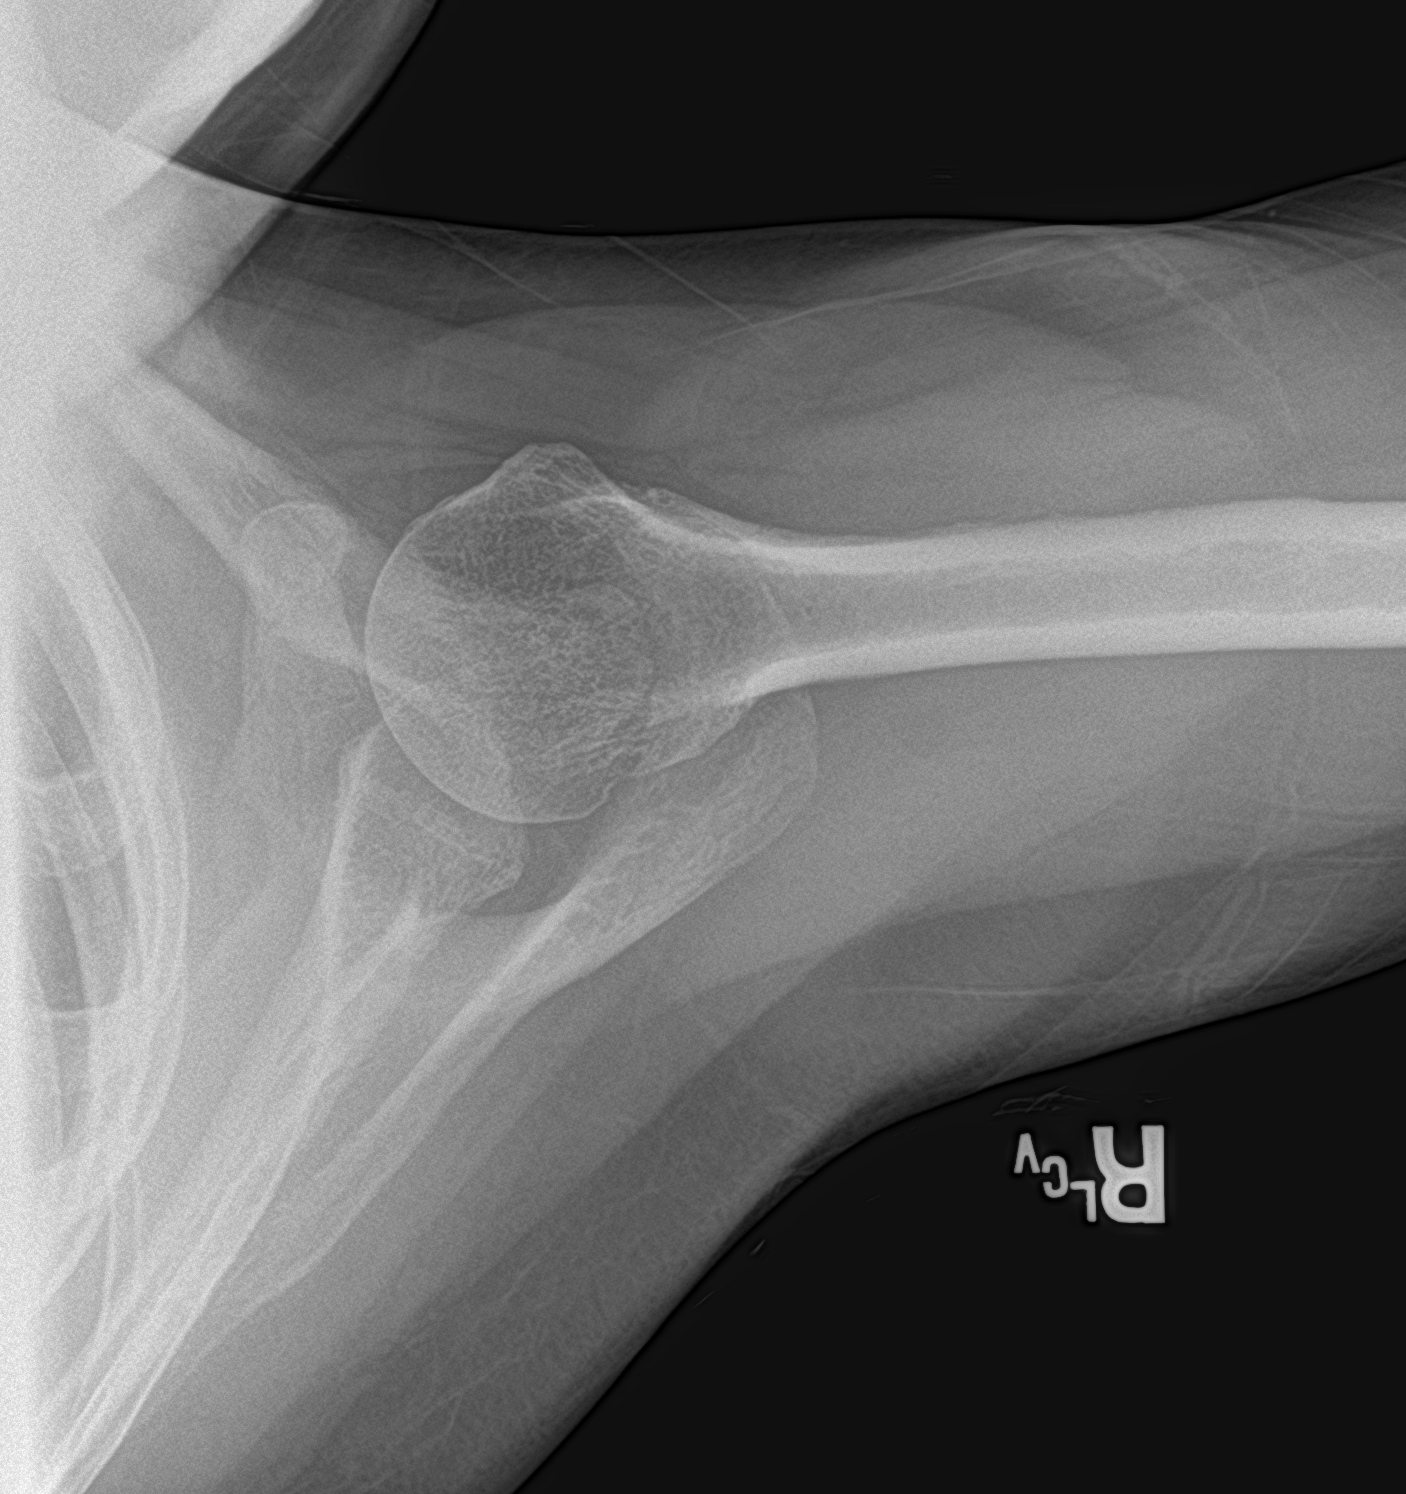

[3 of 3 positions shown; findings below may reference images not displayed]

FINDINGS: There is no evidence of fracture or dislocation. There is no
evidence of arthropathy or other focal bone abnormality. Soft
tissues are unremarkable.
IMPRESSION: Negative.

## 2020-05-21 DIAGNOSIS — Z8616 Personal history of COVID-19: Secondary | ICD-10-CM

## 2020-05-21 HISTORY — DX: Personal history of COVID-19: Z86.16

## 2020-07-28 ENCOUNTER — Emergency Department: Payer: BLUE CROSS/BLUE SHIELD

## 2020-07-28 ENCOUNTER — Encounter: Payer: Self-pay | Admitting: Emergency Medicine

## 2020-07-28 ENCOUNTER — Emergency Department
Admission: EM | Admit: 2020-07-28 | Discharge: 2020-07-28 | Disposition: A | Discharge status: Home / Self Care | Payer: BLUE CROSS/BLUE SHIELD | Attending: Emergency Medicine | Admitting: Emergency Medicine

## 2020-07-28 ENCOUNTER — Other Ambulatory Visit: Payer: Self-pay

## 2020-07-28 DIAGNOSIS — R55 Syncope and collapse: Secondary | ICD-10-CM | POA: Diagnosis present

## 2020-07-28 DIAGNOSIS — Z87891 Personal history of nicotine dependence: Secondary | ICD-10-CM | POA: Diagnosis not present

## 2020-07-28 DIAGNOSIS — R079 Chest pain, unspecified: Secondary | ICD-10-CM

## 2020-07-28 DIAGNOSIS — I2510 Atherosclerotic heart disease of native coronary artery without angina pectoris: Secondary | ICD-10-CM | POA: Diagnosis not present

## 2020-07-28 DIAGNOSIS — Z79899 Other long term (current) drug therapy: Secondary | ICD-10-CM | POA: Insufficient documentation

## 2020-07-28 DIAGNOSIS — I10 Essential (primary) hypertension: Secondary | ICD-10-CM | POA: Insufficient documentation

## 2020-07-28 LAB — BASIC METABOLIC PANEL
Anion gap: 13 (ref 5–15)
BUN: 14 mg/dL (ref 8–23)
CO2: 19 mmol/L — ABNORMAL LOW (ref 22–32)
Calcium: 9.5 mg/dL (ref 8.9–10.3)
Chloride: 100 mmol/L (ref 98–111)
Creatinine, Ser: 0.92 mg/dL (ref 0.44–1.00)
GFR, Estimated: >60 mL/min (ref >60)
Glucose, Bld: 116 mg/dL — ABNORMAL HIGH (ref 70–99)
Potassium: 4.3 mmol/L (ref 3.5–5.1)
Sodium: 132 mmol/L — ABNORMAL LOW (ref 135–145)

## 2020-07-28 LAB — CBC
HCT: 39.8 % (ref 36.0–46.0)
Hemoglobin: 13 g/dL (ref 12.0–15.0)
MCH: 27.9 pg (ref 26.0–34.0)
MCHC: 32.7 g/dL (ref 30.0–36.0)
MCV: 85.4 fL (ref 80.0–100.0)
Platelets: 336 10*3/uL (ref 150–400)
RBC: 4.66 MIL/uL (ref 3.87–5.11)
RDW: 13.2 % (ref 11.5–15.5)
WBC: 10.2 10*3/uL (ref 4.0–10.5)
nRBC: 0 % (ref 0.0–0.2)

## 2020-07-28 LAB — URINALYSIS, COMPLETE (UACMP) WITH MICROSCOPIC
Bilirubin Urine: NEGATIVE
Glucose, UA: NEGATIVE mg/dL
Ketones, ur: NEGATIVE mg/dL
Leukocytes,Ua: NEGATIVE
Nitrite: NEGATIVE
Protein, ur: NEGATIVE mg/dL
Specific Gravity, Urine: 1.01 (ref 1.005–1.030)
pH: 6 (ref 5.0–8.0)

## 2020-07-28 LAB — TROPONIN I (HIGH SENSITIVITY)
Troponin I (High Sensitivity): 4 ng/L (ref <18)
Troponin I (High Sensitivity): 13 ng/L (ref <18)

## 2020-07-28 LAB — D-DIMER, QUANTITATIVE: D-Dimer, Quant: 0.31 ug/mL-FEU (ref 0.00–0.50)

## 2020-07-28 MED ORDER — LORAZEPAM 1 MG PO TABS
1.0000 mg | ORAL_TABLET | Freq: Once | ORAL | Status: AC
  Administered 2020-07-28: 1 mg via ORAL
  Filled 2020-07-28: qty 1

## 2020-07-28 NOTE — ED Triage Notes (Signed)
Pt via POV from home. Pt c/o generalized chest pressure and feeling like she "needs air." Pt states that she has been feeling this since yesterday. Pt says the pain radiates down into her R arm. On arrival, pt is crying hysterically and hyperventilating. Pt is A&Ox4 and NAD. After instructing pt to breathe slowly and calm down.

## 2020-07-28 NOTE — ED Notes (Signed)
Pt to ED01A at this time. Pt noted to have labored respirations at this time, O2 sat 100%.  Pt noted to be tearful in exam room at this time.   Pt stated she saw cardiologist yesterday and he upped her BP medication, and wants her to do a stress test Wednesday.   Pt states pressure localized to epigastric region at this time. Pt states "I don't know if it's my stomach or my chest"

## 2020-07-28 NOTE — ED Provider Notes (Signed)
[facility] Emergency Department Provider Note  Time seen: 5:50 PM  I have reviewed the triage vital signs and the nursing notes.   HISTORY  Chief Complaint Chest Pain and Shortness of Breath   HPI Jenny Lee is a 65 y.o. female   with a past medical history of anxiety, CAD, gastric reflux, hypertension, hyperlipidemia, presents to the emergency department for near syncope. According to the patient states she was outside walking when all of a sudden she began feeling short of breath like she was going to pass out felt like her arms were tingling. Here the patient is tearful, very worried about her blood pressure being 160/84. Patient states a history of anxiety but states this feels different than the panic attack because she was feeling fine before this happened. Patient denies any leg pain or swelling. Patient initially quite tachycardic but is currently 87 bpm without intervention. Patient states she has been feeling some chest tightness intermittently and actually has a stress test scheduled for this Wednesday by her doctor.  Past Medical History:  Diagnosis Date  . Anxiety   . CAD (coronary artery disease)   . Female genuine stress incontinence 08/15/2014  . GERD (gastroesophageal reflux disease)   . H/O acute myocardial infarction 08/15/2014   Overview:  2002   . Heart attack ([place])    2001  . HLD (hyperlipidemia) 12/20/2015  . HTN (hypertension) 12/20/2015  . Hx of cardiac cath 12/11/2015   Overview:  Normal coronary arteries 2017  . Hypercholesteremia   . Hypertension   . Mastalgia 10/06/2012  . 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 Tachycardia 12/20/2015  . Takotsubo cardiomyopathy     Patient Active Problem List   Diagnosis Date Noted  . H. pylori infection 11/20/2017  . Special screening for malignant neoplasms, colon   . Polyp of sigmoid colon   . Diverticulosis of large intestine without diverticulitis   . Benign  neoplasm of ascending colon   . Benign neoplasm of cecum   . 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 Renal artery stenosis ([place]) 02/24/2017  . Tachycardia 12/20/2015  . Takotsubo cardiomyopathy 12/20/2015  . HTN (hypertension) 12/20/2015  . HLD (hyperlipidemia) 12/20/2015  . GERD (gastroesophageal reflux disease) 12/20/2015  . Hx of cardiac cath 12/11/2015  . Syncope 11/26/2015  . H/O acute myocardial infarction 08/15/2014  . Encounter for other preprocedural examination 08/15/2014  . Female genuine stress incontinence 08/15/2014  . 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 Mastalgia 10/06/2012    Past Surgical History:  Procedure Laterality Date  . ABDOMINAL HYSTERECTOMY  2004  . CARDIAC CATHETERIZATION N/A 11/26/2015   Procedure: Left Heart Cath and Coronary Angiography;  Surgeon: Dionisio David, MD;  Location: [place] CV LAB;  Service: Cardiovascular;  Laterality: N/A;  . COLONOSCOPY WITH PROPOFOL N/A 12/23/2016   Procedure: COLONOSCOPY WITH PROPOFOL;  Surgeon: Jonathon Bellows, MD;  Location: [facility] ENDOSCOPY;  Service: Endoscopy;  Laterality: N/A;  . COLONOSCOPY WITH PROPOFOL N/A 06/26/2017   Procedure: COLONOSCOPY WITH PROPOFOL;  Surgeon: Virgel Manifold, MD;  Location: [facility] ENDOSCOPY;  Service: Gastroenterology;  Laterality: N/A;  . ESOPHAGOGASTRODUODENOSCOPY (EGD) WITH PROPOFOL N/A 12/23/2016   Procedure: ESOPHAGOGASTRODUODENOSCOPY (EGD) WITH PROPOFOL;  Surgeon: Jonathon Bellows, MD;  Location: [facility] ENDOSCOPY;  Service: Endoscopy;  Laterality: N/A;  . ESOPHAGOGASTRODUODENOSCOPY (EGD) WITH PROPOFOL N/A 03/31/2017   Procedure: ESOPHAGOGASTRODUODENOSCOPY (EGD) WITH PROPOFOL WITH GASTRIC MAPPING;  Surgeon: Jonathon Bellows, MD;  Location: [facility] ENDOSCOPY;  Service: Gastroenterology;  Laterality:  N/A;  . ESOPHAGOGASTRODUODENOSCOPY (EGD) WITH PROPOFOL N/A 12/21/2017   Procedure: ESOPHAGOGASTRODUODENOSCOPY (EGD) WITH PROPOFOL;  Surgeon: Jonathon Bellows, MD;  Location: [facility] ENDOSCOPY;   Service: Gastroenterology;  Laterality: N/A;  . NECK SURGERY  1998  . RENAL ANGIOGRAPHY Bilateral 03/02/2017   Procedure: RENAL ANGIOGRAPHY;  Surgeon: Algernon Huxley, MD;  Location: [place] CV LAB;  Service: Cardiovascular;  Laterality: Bilateral;    Prior to Admission medications   Medication Sig Start Date End Date Taking? Authorizing Provider  ALPRAZolam Duanne Moron) 0.25 MG tablet  04/24/17   [provider]  aspirin 81 MG tablet Take 81 mg by mouth daily.    [provider]  atorvastatin (LIPITOR) 40 MG tablet  04/20/17   [provider]  carvedilol (COREG) 12.5 MG tablet Take 12.5 mg by mouth 2 (two) times daily with a meal.    [provider]  chlorthalidone (HYGROTON) 25 MG tablet Take 12.5 mg by mouth every morning.     [provider]  clonazePAM [name]) 0.5 MG tablet  10/28/17   [provider]  cyclobenzaprine (FLEXERIL) 10 MG tablet Take 1 tablet (10 mg total) by mouth 3 (three) times daily as needed. 08/20/19   Sable Feil, PA-C  dicyclomine (BENTYL) 10 MG capsule Take 10 mg by mouth 4 (four) times daily -  before meals and at bedtime.    [provider]  enalapril (VASOTEC) 20 MG tablet Take 20 mg by mouth 2 (two) times daily.     [provider]  estradiol (ESTRACE) 0.5 MG tablet Take 0.5 mg by mouth daily.    [provider]  linaclotide (LINZESS) 290 MCG CAPS capsule Take 290 mcg by mouth daily before breakfast.    [provider]  Melatonin 10 MG TABS Take 10 mg by mouth at bedtime.     [provider]  naproxen sodium (ALEVE) 220 MG tablet Take 220 mg by mouth daily as needed.    [provider]  ondansetron (ZOFRAN) 4 MG tablet Take 4 mg by mouth every 8 (eight) hours as needed for nausea.  12/01/16   [provider]  pantoprazole (PROTONIX) 40 MG tablet 40 mg. 08/28/17   [provider]  sertraline (ZOLOFT) 100 MG tablet Take 1 tablet by mouth daily.   09/09/16   [provider]  traMADol (ULTRAM) 50 MG tablet Take 1 tablet (50 mg total) by mouth every 6 (six) hours as needed. 08/20/19 08/19/20  Sable Feil, PA-C  Vitamin D, Ergocalciferol, (DRISDOL) 50000 units CAPS capsule  11/06/17   [provider]    No Known Allergies  Family History  Problem Relation Age of Onset  . Breast cancer Sister 61    Social History Social History   Tobacco Use  . Smoking status: Former Smoker    Packs/day: 0.00    Years: 0.00    Pack years: 0.00  . Smokeless tobacco: Never Used  Vaping Use  . Vaping Use: Never used  Substance Use Topics  . Alcohol use: Yes    Alcohol/week: 0.0 standard drinks    Comment: occasional wine  . Drug use: No    Review of Systems Constitutional: Negative for fever. Positive for near syncope Cardiovascular: Intermittent chest tightness Respiratory: Positive for shortness of breath Gastrointestinal: Negative for abdominal pain. Positive for nausea but negative for vomiting Musculoskeletal: Negative for musculoskeletal complaints Neurological: Negative for headache All other ROS negative  ____________________________________________   PHYSICAL EXAM:  VITAL SIGNS: ED Triage  Vitals  Enc Vitals Group     BP 07/28/20 1617 (!) 166/93     Pulse Rate 07/28/20 1617 (!) 123     Resp 07/28/20 1617 18     Temp 07/28/20 1617 (!) 97.4 F (36.3 C)     Temp src --      SpO2 07/28/20 1617 100 %     Weight 07/28/20 1617 163 lb (73.9 kg)     Height 07/28/20 1617 5\' 5"  (1.651 m)     Head Circumference --      Peak Flow --      Pain Score 07/28/20 1621 10     Pain Loc --      Pain Edu? --      Excl. in [place]? --    Constitutional: Alert and oriented. Well appearing and in no distress. Eyes: Normal exam ENT      Head: Normocephalic and atraumatic.      Mouth/Throat: Mucous membranes are moist. Cardiovascular: Normal rate, regular rhythm.  Respiratory: Normal respiratory effort without tachypnea  nor retractions. Breath sounds are clear  Gastrointestinal: Soft and nontender. No distention. Musculoskeletal: Nontender with normal range of motion in all extremities. No lower extremity tenderness or edema. Neurologic:  Normal speech and language. No gross focal neurologic deficits Skin:  Skin is warm, dry and intact.  Psychiatric: Mood and affect are normal.   ____________________________________________    EKG  EKG viewed and interpreted by myself shows sinus tachycardia 124 bpm with a narrow QRS, normal axis, normal intervals, no concerning ST changes.  ____________________________________________    RADIOLOGY  Chest x-ray is clear  ____________________________________________   INITIAL IMPRESSION / ASSESSMENT AND PLAN / ED COURSE  Pertinent labs & imaging results that were available during my care of the patient were reviewed by me and considered in my medical decision making (see chart for details).   Patient presents emergency department with complaints of shortness of breath near syncope intermittent chest tightness tingling. Patient's labs are thus far very reassuring including a negative troponin. EKG is tachycardic but otherwise normal. Patient's heart rate is coming down on its own without intervention. Throughout my evaluation patient remains tearful. High suspicion of anxiety that could be contributing to her symptomology. We will repeat a troponin and check a D-dimer as a precaution. No pleuritic pain. No leg pain or swelling. We will also treat with an oral dose of Ativan and reassess. Patient agreeable to plan of care.  Patient is D-dimer is negative.  Repeat troponin remains negative.  Patient is much more calm, blood pressure 105/70.  Patient states she feels much better.  Patient has a stress test scheduled for Wednesday.  We will discharge patient home with routine follow-up.  We will send a urine culture as a precaution but patient has no urinary  symptoms.  CARLISLE TORGESON was evaluated in Emergency Department on 07/28/2020 for the symptoms described in the history of present illness. She was evaluated in the context of the global COVID-19 pandemic, which necessitated consideration that the patient might be at risk for infection with the SARS-CoV-2 virus that causes COVID-19. Institutional protocols and algorithms that pertain to the evaluation of patients at risk for COVID-19 are in a state of rapid change based on information released by regulatory bodies including the CDC and federal and state organizations. These policies and algorithms were followed during the patient's care in the ED.  ____________________________________________   FINAL CLINICAL IMPRESSION(S) / ED DIAGNOSES  Near syncope  Harvest Dark, MD 07/28/20 [phone]

## 2020-07-30 LAB — URINE CULTURE: Culture: 60000 — AB

## 2022-02-06 ENCOUNTER — Emergency Department
Admission: EM | Admit: 2022-02-06 | Discharge: 2022-02-06 | Disposition: A | Discharge status: Home / Self Care | Payer: BLUE CROSS/BLUE SHIELD | Attending: Emergency Medicine | Admitting: Emergency Medicine

## 2022-02-06 ENCOUNTER — Other Ambulatory Visit: Payer: Self-pay

## 2022-02-06 ENCOUNTER — Emergency Department: Payer: BLUE CROSS/BLUE SHIELD

## 2022-02-06 ENCOUNTER — Encounter: Payer: Self-pay | Admitting: Emergency Medicine

## 2022-02-06 DIAGNOSIS — E871 Hypo-osmolality and hyponatremia: Secondary | ICD-10-CM | POA: Insufficient documentation

## 2022-02-06 DIAGNOSIS — R0789 Other chest pain: Secondary | ICD-10-CM | POA: Diagnosis present

## 2022-02-06 DIAGNOSIS — I2510 Atherosclerotic heart disease of native coronary artery without angina pectoris: Secondary | ICD-10-CM | POA: Diagnosis not present

## 2022-02-06 DIAGNOSIS — R079 Chest pain, unspecified: Secondary | ICD-10-CM

## 2022-02-06 DIAGNOSIS — Z7982 Long term (current) use of aspirin: Secondary | ICD-10-CM | POA: Insufficient documentation

## 2022-02-06 DIAGNOSIS — Z79899 Other long term (current) drug therapy: Secondary | ICD-10-CM | POA: Insufficient documentation

## 2022-02-06 DIAGNOSIS — R42 Dizziness and giddiness: Secondary | ICD-10-CM | POA: Insufficient documentation

## 2022-02-06 DIAGNOSIS — F419 Anxiety disorder, unspecified: Secondary | ICD-10-CM | POA: Insufficient documentation

## 2022-02-06 DIAGNOSIS — F411 Generalized anxiety disorder: Secondary | ICD-10-CM

## 2022-02-06 DIAGNOSIS — I10 Essential (primary) hypertension: Secondary | ICD-10-CM | POA: Insufficient documentation

## 2022-02-06 LAB — COMPREHENSIVE METABOLIC PANEL
ALT: 14 U/L (ref 0–44)
AST: 25 U/L (ref 15–41)
Albumin: 4.5 g/dL (ref 3.5–5.0)
Alkaline Phosphatase: 76 U/L (ref 38–126)
Anion gap: 12 (ref 5–15)
BUN: 15 mg/dL (ref 8–23)
CO2: 25 mmol/L (ref 22–32)
Calcium: 9.3 mg/dL (ref 8.9–10.3)
Chloride: 96 mmol/L — ABNORMAL LOW (ref 98–111)
Creatinine, Ser: 0.99 mg/dL (ref 0.44–1.00)
GFR, Estimated: >60 mL/min (ref >60)
Glucose, Bld: 135 mg/dL — ABNORMAL HIGH (ref 70–99)
Potassium: 3.5 mmol/L (ref 3.5–5.1)
Sodium: 133 mmol/L — ABNORMAL LOW (ref 135–145)
Total Bilirubin: 0.6 mg/dL (ref 0.3–1.2)
Total Protein: 8.2 g/dL — ABNORMAL HIGH (ref 6.5–8.1)

## 2022-02-06 LAB — CBC WITH DIFFERENTIAL/PLATELET
Abs Immature Granulocytes: 0.02 10*3/uL (ref 0.00–0.07)
Basophils Absolute: 0.1 10*3/uL (ref 0.0–0.1)
Basophils Relative: 1 %
Eosinophils Absolute: 0.1 10*3/uL (ref 0.0–0.5)
Eosinophils Relative: 2 %
HCT: 35.6 % — ABNORMAL LOW (ref 36.0–46.0)
Hemoglobin: 11.6 g/dL — ABNORMAL LOW (ref 12.0–15.0)
Immature Granulocytes: 0 %
Lymphocytes Relative: 58 %
Lymphs Abs: 5.3 10*3/uL — ABNORMAL HIGH (ref 0.7–4.0)
MCH: 27.1 pg (ref 26.0–34.0)
MCHC: 32.6 g/dL (ref 30.0–36.0)
MCV: 83.2 fL (ref 80.0–100.0)
Monocytes Absolute: 0.7 10*3/uL (ref 0.1–1.0)
Monocytes Relative: 7 %
Neutro Abs: 2.8 10*3/uL (ref 1.7–7.7)
Neutrophils Relative %: 32 %
Platelets: 272 10*3/uL (ref 150–400)
RBC: 4.28 MIL/uL (ref 3.87–5.11)
RDW: 13.8 % (ref 11.5–15.5)
WBC: 9 10*3/uL (ref 4.0–10.5)
nRBC: 0 % (ref 0.0–0.2)

## 2022-02-06 LAB — TROPONIN I (HIGH SENSITIVITY)
Troponin I (High Sensitivity): 4 ng/L (ref <18)
Troponin I (High Sensitivity): 9 ng/L (ref <18)

## 2022-02-06 MED ORDER — CLONAZEPAM 0.5 MG PO TABS
0.5000 mg | ORAL_TABLET | Freq: Once | ORAL | Status: AC
  Administered 2022-02-06: 0.5 mg via ORAL
  Filled 2022-02-06: qty 1

## 2022-02-06 MED ORDER — NITROGLYCERIN 2 % TD OINT
0.5000 [in_us] | TOPICAL_OINTMENT | Freq: Once | TRANSDERMAL | Status: AC
  Administered 2022-02-06: 0.5 [in_us] via TOPICAL
  Filled 2022-02-06: qty 1

## 2022-02-06 MED ORDER — ASPIRIN 81 MG PO CHEW
324.0000 mg | CHEWABLE_TABLET | Freq: Once | ORAL | Status: AC
  Administered 2022-02-06: 324 mg via ORAL
  Filled 2022-02-06: qty 4

## 2022-02-06 MED ORDER — CLONAZEPAM 0.5 MG PO TABS: 0.5000 mg | ORAL_TABLET | Freq: Three times a day (TID) | ORAL | 0 refills | Status: DC | PRN

## 2022-02-06 NOTE — ED Notes (Signed)
ED Provider at bedside. 

## 2022-02-06 NOTE — ED Notes (Signed)
Pt brought to ED rm 17 at this time, this RN now assuming care. 

## 2022-02-06 NOTE — ED Provider Notes (Signed)
[facility] Provider Note    Event Date/Time   First MD Initiated Contact with Patient 02/06/22 0211     (approximate)   History   Chest Pain   HPI  Jenny Lee is a 66 y.o. female who presents to the ED from home with a chief complaint of chest pain.  Patient reports midsternal, nonradiating chest pain yesterday evening accompanied by dizziness.  Denies associated diaphoresis, palpitations, shortness of breath, nausea/vomiting.  Denies recent fever, cough, abdominal pain, diarrhea.     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   Renal artery stenosis ([name]) 02/24/2017   Tachycardia 12/20/2015   Takotsubo cardiomyopathy 12/20/2015   HTN (hypertension) 12/20/2015   HLD (hyperlipidemia) 12/20/2015   GERD (gastroesophageal reflux disease) 12/20/2015   Hx of cardiac cath 12/11/2015   Syncope 11/26/2015   H/O acute myocardial infarction 08/15/2014   Encounter for other preprocedural examination 08/15/2014   Female genuine stress incontinence  08/15/2014   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   Mastalgia 10/06/2012     Past Surgical History   Past Surgical History:  Procedure Laterality Date   ABDOMINAL HYSTERECTOMY  2004   CARDIAC CATHETERIZATION N/A 11/26/2015   Procedure: Left Heart Cath and Coronary Angiography;  Surgeon: Dionisio David, MD;  Location: [place] [facility];  Service: Cardiovascular;  Laterality: N/A;   COLONOSCOPY WITH PROPOFOL N/A 12/23/2016   Procedure: COLONOSCOPY WITH PROPOFOL;  Surgeon: Jonathon Bellows, MD;  Location: [facility] ENDOSCOPY;  Service: Endoscopy;  Laterality: N/A;   COLONOSCOPY WITH PROPOFOL N/A 06/26/2017   Procedure: COLONOSCOPY WITH PROPOFOL;  Surgeon: Virgel Manifold, MD;  Location: [facility] ENDOSCOPY;  Service: Gastroenterology;  Laterality: N/A;   ESOPHAGOGASTRODUODENOSCOPY (EGD) WITH PROPOFOL N/A 12/23/2016   Procedure: ESOPHAGOGASTRODUODENOSCOPY (EGD) WITH PROPOFOL;  Surgeon: Jonathon Bellows, MD;  Location: [facility] ENDOSCOPY;  Service: Endoscopy;  Laterality: N/A;   ESOPHAGOGASTRODUODENOSCOPY (EGD) WITH PROPOFOL N/A 03/31/2017   Procedure: ESOPHAGOGASTRODUODENOSCOPY (EGD) WITH PROPOFOL WITH GASTRIC MAPPING;  Surgeon: Jonathon Bellows, MD;  Location: [facility] ENDOSCOPY;  Service: Gastroenterology;  Laterality: N/A;   ESOPHAGOGASTRODUODENOSCOPY (EGD) WITH PROPOFOL N/A 12/21/2017   Procedure: ESOPHAGOGASTRODUODENOSCOPY (EGD) WITH PROPOFOL;  Surgeon: Jonathon Bellows, MD;  Location: [facility] ENDOSCOPY;  Service: Gastroenterology;  Laterality: N/A;   NECK SURGERY  1998   RENAL ANGIOGRAPHY Bilateral 03/02/2017   Procedure: RENAL ANGIOGRAPHY;  Surgeon: Algernon Huxley, MD;  Location: [place] CV LAB;  Service: Cardiovascular;  Laterality: Bilateral;     Home  Medications   Prior to Admission medications   Medication Sig Start Date End Date Taking? Authorizing Provider  ALPRAZolam Duanne Moron) 0.25 MG tablet  04/24/17   [provider]  aspirin 81 MG tablet Take 81 mg by mouth daily.    [provider]  atorvastatin (LIPITOR) 40 MG tablet  04/20/17   [provider]  carvedilol (COREG) 12.5 MG tablet Take 12.5 mg by mouth 2 (two) times daily with a meal.    [provider]  chlorthalidone (HYGROTON) 25 MG tablet Take 12.5 mg by mouth every morning.     [provider]  clonazePAM [name]) 0.5 MG tablet  10/28/17   [provider]  cyclobenzaprine (FLEXERIL) 10 MG tablet Take 1 tablet (10 mg total) by mouth 3 (three) times daily as needed. 08/20/19   Sable Feil, PA-C  dicyclomine (BENTYL) 10 MG capsule Take 10 mg by mouth 4 (four) times daily -  before meals and at bedtime.    [provider]  enalapril (VASOTEC) 20 MG tablet Take 20 mg by mouth 2 (two) times daily.     [provider]  estradiol (ESTRACE) 0.5 MG tablet Take 0.5 mg by mouth daily.    [provider]  linaclotide (LINZESS) 290 MCG CAPS capsule Take 290 mcg by mouth daily before breakfast.    [provider]  Melatonin 10 MG TABS Take 10 mg by mouth at bedtime.     [provider]  naproxen sodium (ALEVE) 220 MG tablet Take 220 mg by mouth daily as needed.    [provider]  ondansetron (ZOFRAN) 4 MG tablet Take 4 mg by mouth every 8 (eight) hours as needed for nausea.  12/01/16   [provider]  pantoprazole (PROTONIX) 40 MG tablet 40 mg. 08/28/17   [provider]  sertraline (ZOLOFT) 100 MG tablet Take 1 tablet by mouth daily.  09/09/16   [provider]  Vitamin D, Ergocalciferol, (DRISDOL) 50000 units CAPS capsule  11/06/17   [provider]     Allergies  Patient has no known allergies.   Family History   Family History  Problem Relation Age of Onset   Breast cancer Sister 38     Physical Exam  Triage Vital Signs: ED Triage Vitals  Enc Vitals Group     BP 02/06/22 0034 (!) 154/84     Pulse Rate 02/06/22 0034 (!) 122     Resp 02/06/22 0034 20     Temp 02/06/22 0034 97.8  F (36.6 C)     Temp Source 02/06/22 0034 Oral     SpO2 02/06/22 0034 97 %     Weight 02/06/22 0028 165 lb (74.8 kg)     Height 02/06/22 0028 '5\' 5"'$  (1.651 m)     Head Circumference --      Peak Flow --      Pain Score 02/06/22 0028 10     Pain Loc --      Pain Edu? --      Excl. in [place]? --     Updated Vital Signs: BP (!) 114/59   Pulse 66   Temp 97.8 F (36.6 C) (Oral)   Resp 15   Ht '5\' 5"'$  (1.651 m)   Wt 74.8 kg   SpO2 95%   BMI 27.46 kg/m    General: Awake, mild distress.  Anxious.   CV:  RRR.  Good peripheral perfusion.  Resp:  Normal effort.  CTAB. Abd:  Nontender.  No distention.  Other:  Bilateral calves are nonswollen and nontender.   ED Results / Procedures / Treatments  Labs (all labs ordered are listed, but only abnormal results are displayed) Labs Reviewed  CBC WITH DIFFERENTIAL/PLATELET - Abnormal; Notable for the following components:      Result Value   Hemoglobin 11.6 (*)    HCT 35.6 (*)    Lymphs Abs 5.3 (*)    All other components within normal limits  COMPREHENSIVE METABOLIC PANEL - Abnormal; Notable for the following components:   Sodium 133 (*)    Chloride 96 (*)    Glucose, Bld 135 (*)    Total Protein 8.2 (*)    All other components within normal limits  TROPONIN I (HIGH SENSITIVITY)  TROPONIN I (HIGH SENSITIVITY)     EKG  ED ECG REPORT I, Danniel Tones J, the attending physician, personally viewed and interpreted this ECG.   Date: 02/06/2022  EKG Time: 0031  Rate: 141  Rhythm: sinus tachycardia  Axis: Normal  Intervals:none  ST&T Change: Nonspecific    RADIOLOGY I have independently visualized and interpreted patient's chest x-ray as well as noted the radiology interpretation:  Chest x-ray: No active disease  Official radiology report(s): DG Chest Portable 1 View  Result Date: 02/06/2022 CLINICAL DATA:  Chest pain EXAM: PORTABLE CHEST 1 VIEW COMPARISON:  07/28/2020 FINDINGS: The heart size and mediastinal contours are  within normal limits. Both lungs are clear. The visualized skeletal structures are unremarkable. IMPRESSION: No active disease. Electronically Signed   By: Rolm Baptise M.D.   On: 02/06/2022 01:04     PROCEDURES:  Critical Care performed: No  .1-3 Lead EKG Interpretation  Performed by: Paulette Blanch, MD Authorized by: Paulette Blanch, MD     Interpretation: normal     ECG rate:  76   ECG rate assessment: normal     Rhythm: sinus rhythm     Ectopy: none     Conduction: normal   Comments:     Patient placed on cardiac monitor to evaluate for arrhythmias    MEDICATIONS ORDERED IN ED: Medications  clonazePAM (KLONOPIN) tablet 0.5 mg (has no administration in time range)  aspirin chewable tablet 324 mg (324 mg Oral Given 02/06/22 0252)  nitroGLYCERIN (NITROGLYN) 2 % ointment 0.5 inch (0.5 inches Topical Given 02/06/22 0252)     IMPRESSION / MDM / ASSESSMENT AND PLAN / ED COURSE  I reviewed the triage vital signs and the nursing notes.                             66 year old female presenting with chest pain. Differential diagnosis includes, but is not limited to, ACS, aortic dissection, pulmonary embolism, cardiac tamponade, pneumothorax, pneumonia, pericarditis, myocarditis, GI-related causes including esophagitis/gastritis, and musculoskeletal chest wall pain.   I have personally reviewed patient's records and note a nephrology office visit for hypokalemia and hypertension on 01/30/2022.  Patient's presentation is most consistent with acute presentation with potential threat to life or bodily function.  The patient is on the cardiac monitor to evaluate for evidence of arrhythmia and/or significant heart rate changes.  Laboratory results demonstrate normal WBC of 9, mild hyponatremia with sodium 133, initial troponin and chest x-ray unremarkable.  Initial EKG demonstrated sinus tachycardia but heart rate has now normalized.  Feel likely secondary to patient's heightened anxiety.  Low  suspicion for PE or aortic dissection.  Will administer baby aspirin, nitroglycerin paste.  Await repeat troponin and reassessment.  Clinical Course as of 02/06/22 0359  Thu Feb 06, 2022  0358 Repeat troponin remains negative.  Patient is overall feeling better but remains anxious.  I did offer her hospitalization but patient and her spouse states they have an upcoming appointment with Dr. Fletcher Anon in December.  She feels fine going home.  We will add low-dose Klonopin and place internal cardiology referral for sooner follow-up.  Strict return precautions given.  Both verbalized understanding agree with plan of care. [JS]    Clinical Course User Index [JS] Paulette Blanch, MD     FINAL CLINICAL IMPRESSION(S) / ED DIAGNOSES   Final diagnoses:  Nonspecific chest pain  Anxiety state     Rx / DC Orders   ED Discharge Orders     None        Note:  This document was prepared using Dragon voice recognition software and may include unintentional dictation errors.   Paulette Blanch, MD 02/06/22 [phone]

## 2022-02-06 NOTE — ED Notes (Signed)
Signing pad did not work. Pt verbalized understanding of DC instructions. 

## 2022-02-06 NOTE — ED Triage Notes (Signed)
Pt to triage via w/c, appears very anxious; c/o midsternal CP, nonradiating accomp by dizziness

## 2022-02-06 NOTE — Discharge Instructions (Signed)
You may take Klonopin as needed for anxiety.  Return to the ER for recurrent or worsening symptoms, persistent vomiting, difficulty breathing or other concerns.

## 2022-04-01 ENCOUNTER — Ambulatory Visit: Payer: BLUE CROSS/BLUE SHIELD | Admitting: Cardiovascular Disease

## 2022-06-03 ENCOUNTER — Telehealth: Payer: Self-pay | Admitting: Gastroenterology

## 2022-06-03 NOTE — Telephone Encounter (Signed)
Patient was seen by Dr Vicente Males in the past but she had gotten a new insurance that was out of network with [facility] so she went to [facility]. Now she has gotten another new insurance that is in network with [facility] and she wants to be seen by Dr. Vicente Males again.

## 2022-06-24 DIAGNOSIS — Q6102 Congenital multiple renal cysts: Secondary | ICD-10-CM | POA: Insufficient documentation

## 2022-07-15 ENCOUNTER — Ambulatory Visit: Payer: Medicare HMO | Admitting: Cardiovascular Disease

## 2022-08-11 ENCOUNTER — Other Ambulatory Visit: Payer: Self-pay | Admitting: Internal Medicine

## 2022-08-11 DIAGNOSIS — R1011 Right upper quadrant pain: Secondary | ICD-10-CM

## 2022-08-19 ENCOUNTER — Ambulatory Visit: Payer: Medicare HMO

## 2022-08-20 ENCOUNTER — Ambulatory Visit
Admission: RE | Admit: 2022-08-20 | Discharge: 2022-08-20 | Disposition: A | Discharge status: Home / Self Care | Payer: Medicare HMO | Source: Ambulatory Visit | Attending: Internal Medicine | Admitting: Internal Medicine

## 2022-08-20 DIAGNOSIS — R1011 Right upper quadrant pain: Secondary | ICD-10-CM

## 2022-09-03 ENCOUNTER — Ambulatory Visit (INDEPENDENT_AMBULATORY_CARE_PROVIDER_SITE_OTHER): Payer: Medicare HMO | Admitting: Surgery

## 2022-09-03 ENCOUNTER — Encounter: Payer: Self-pay | Admitting: Surgery

## 2022-09-03 VITALS — BP 174/84 | HR 94 | Temp 98.0°F | Ht 65.0 in | Wt 166.0 lb

## 2022-09-03 DIAGNOSIS — K8020 Calculus of gallbladder without cholecystitis without obstruction: Secondary | ICD-10-CM

## 2022-09-03 NOTE — Patient Instructions (Addendum)
Our surgery scheduler Barbara will call you within 24-48 hours to get you scheduled. If you have not heard from her after 48 hours, please call our office. Have the blue sheet available when she calls to write down important information.   If you have any concerns or questions, please feel free to call our office.    Minimally Invasive Cholecystectomy  Minimally invasive cholecystectomy is surgery to remove the gallbladder. The gallbladder is a pear-shaped organ that lies beneath the liver on the right side of the body. The gallbladder stores bile, which is a fluid that helps the body digest fats. Cholecystectomy is often done to treat inflammation (irritation and swelling) of the gallbladder (cholecystitis). This condition is usually caused by a buildup of gallstones (cholelithiasis) in the gallbladder or when the fluid in the gall bladder becomes stagnant because gallstones get stuck in the ducts (tubes) and block the flow of bile. This can result in inflammation and pain. In severe cases, emergency surgery may be required. This procedure is done through small incisions in the abdomen, instead of one large incision. It is also called laparoscopic surgery. A thin scope with a camera (laparoscope) is inserted through one incision. Then surgical instruments are inserted through the other incisions. In some cases, a minimally invasive surgery may need to be changed to a surgery that is done through a larger incision. This is called open surgery. Tell a health care provider about: Any allergies you have. All medicines you are taking, including vitamins, herbs, eye drops, creams, and over-the-counter medicines. Any problems you or family members have had with anesthetic medicines. Any bleeding problems you have. Any surgeries you have had. Any medical conditions you have. Whether you are pregnant or may be pregnant. What are the risks? Generally, this is a safe procedure. However, problems may occur,  including: Infection. Bleeding. Allergic reactions to medicines. Damage to nearby structures or organs. A gallstone remaining in the common bile duct. The common bile duct carries bile from the gallbladder to the small intestine. A bile leak from the liver or cystic duct after your gallbladder is removed. What happens before the procedure? When to stop eating and drinking Follow instructions from your health care provider about what you may eat and drink before your procedure. These may include: 8 hours before the procedure Stop eating most foods. Do not eat meat, fried foods, or fatty foods. Eat only light foods, such as toast or crackers. All liquids are okay except energy drinks and alcohol. 6 hours before the procedure Stop eating. Drink only clear liquids, such as water, clear fruit juice, black coffee, plain tea, and sports drinks. Do not drink energy drinks or alcohol. 2 hours before the procedure Stop drinking all liquids. You may be allowed to take medicines with small sips of water. If you do not follow your health care provider's instructions, your procedure may be delayed or canceled. Medicines Ask your health care provider about: Changing or stopping your regular medicines. This is especially important if you are taking diabetes medicines or blood thinners. Taking medicines such as aspirin and ibuprofen. These medicines can thin your blood. Do not take these medicines unless your health care provider tells you to take them. Taking over-the-counter medicines, vitamins, herbs, and supplements. General instructions If you will be going home right after the procedure, plan to have a responsible adult: Take you home from the hospital or clinic. You will not be allowed to drive. Care for you for the time you are   told. Do not use any products that contain nicotine or tobacco for at least 4 weeks before the procedure. These products include cigarettes, chewing tobacco, and vaping  devices, such as e-cigarettes. If you need help quitting, ask your health care provider. Ask your health care provider: How your surgery site will be marked. What steps will be taken to help prevent infection. These may include: Removing hair at the surgery site. Washing skin with a germ-killing soap. Taking antibiotic medicine. What happens during the procedure?  An IV will be inserted into one of your veins. You will be given one or both of the following: A medicine to help you relax (sedative). A medicine to make you fall asleep (general anesthetic). Your surgeon will make several small incisions in your abdomen. The laparoscope will be inserted through one of the small incisions. The camera on the laparoscope will send images to a monitor in the operating room. This lets your surgeon see inside your abdomen. A gas will be pumped into your abdomen. This will expand your abdomen to give the surgeon more room to perform the surgery. Other tools that are needed for the procedure will be inserted through the other incisions. The gallbladder will be removed through one of the incisions. Your common bile duct may be examined. If stones are found in the common bile duct, they may be removed. After your gallbladder has been removed, the incisions will be closed with stitches (sutures), staples, or skin glue. Your incisions will be covered with a bandage (dressing). The procedure may vary among health care providers and hospitals. What happens after the procedure? Your blood pressure, heart rate, breathing rate, and blood oxygen level will be monitored until you leave the hospital or clinic. You will be given medicines as needed to control your pain. You may have a drain placed in the incision. The drain will be removed a day or two after the procedure. Summary Minimally invasive cholecystectomy, also called laparoscopic cholecystectomy, is surgery to remove the gallbladder using small  incisions. Tell your health care provider about all the medical conditions you have and all the medicines you are taking for those conditions. Before the procedure, follow instructions about when to stop eating and drinking and changing or stopping medicines. Plan to have a responsible adult care for you for the time you are told after you leave the hospital or clinic. This information is not intended to replace advice given to you by your health care provider. Make sure you discuss any questions you have with your health care provider. Document Revised: 11/13/2020 Document Reviewed: 11/13/2020 Elsevier Patient Education  2023 [employer]  

## 2022-09-03 NOTE — Progress Notes (Unsigned)
09/03/2022  Reason for Visit:  Symptomatic cholelithiasis  Requesting Provider:  Sherrie MustacheFayegh Jadali, MD  History of Present Illness: Jenny Lee is a 67 y.o. female presenting for evaluation of symptomatic cholelithiasis.  The patient reports that for at leas the last 8 months she has had issues with RUQ and epigastric pain.  The pain is episodic and is associated with nausea.  The pain radiates to the back sometimes as well.  There's no specific timing for the pain, but can happen during the day, although more often at night.  She overall eats homecooked meals and she and her husband do not eat overly fatty foods.  She does have a significant medical history for an MI in the past s/p balloon angioplasty, cardiomyopathy, SVT, HTN, HLP.  She has had an abdominal histerectomy and chest surgery for a mass in the right clavicle.  She has a history of GERD and takes Protonix daily, but although she does have some heartburn symptoms, this type of pain is not the same.  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   Tachycardia 12/20/2015   Takotsubo cardiomyopathy      Past Surgical History: Past Surgical History:  Procedure Laterality Date   ABDOMINAL HYSTERECTOMY  2004   CARDIAC CATHETERIZATION N/A 11/26/2015   Procedure: Left Heart Cath and Coronary Angiography;  Surgeon: Laurier NancyShaukat A Khan, MD;  Location: [facility] INVASIVE CV LAB;  Service: Cardiovascular;  Laterality: N/A;   COLONOSCOPY WITH PROPOFOL N/A  12/23/2016   Procedure: COLONOSCOPY WITH PROPOFOL;  Surgeon: Wyline MoodAnna, Kiran, MD;  [facility];  Service: Endoscopy;  Laterality: N/A;   COLONOSCOPY WITH PROPOFOL N/A 06/26/2017   Procedure: COLONOSCOPY WITH PROPOFOL;  Surgeon: Pasty Spillersahiliani, Varnita B, MD;  Location: [facility] ENDOSCOPY;  Service: Gastroenterology;  Laterality: N/A;   ESOPHAGOGASTRODUODENOSCOPY (EGD) WITH PROPOFOL N/A 12/23/2016   Procedure: ESOPHAGOGASTRODUODENOSCOPY (EGD) WITH PROPOFOL;  Surgeon: Wyline MoodAnna, Kiran, MD;  [facility];  Service: Endoscopy;  Laterality: N/A;   ESOPHAGOGASTRODUODENOSCOPY (EGD) WITH PROPOFOL N/A 03/31/2017   Procedure: ESOPHAGOGASTRODUODENOSCOPY (EGD) WITH PROPOFOL WITH GASTRIC MAPPING;  Surgeon: Wyline MoodAnna, Kiran, MD;  [facility];  Service: Gastroenterology;  Laterality: N/A;   ESOPHAGOGASTRODUODENOSCOPY (EGD) WITH PROPOFOL N/A 12/21/2017   Procedure: ESOPHAGOGASTRODUODENOSCOPY (EGD) WITH PROPOFOL;  Surgeon: Wyline MoodAnna, Kiran, MD;  [facility];  Service: Gastroenterology;  Laterality: N/A;   NECK SURGERY  1998   RENAL ANGIOGRAPHY Bilateral 03/02/2017   Procedure: RENAL ANGIOGRAPHY;  Surgeon: Annice Needyew, Jason S, MD;  Location: [facility] INVASIVE CV LAB;  Service: Cardiovascular;  Laterality: Bilateral;    Home Medications: Prior to Admission medications   Medication Sig Start Date End Date Taking? Authorizing Provider  aspirin 81 MG tablet Take 81 mg by mouth daily.   Yes [provider]  celecoxib (CELEBREX) 100 MG capsule Take by mouth. 06/25/21  Yes [provider]  cetirizine (ZYRTEC) 10 MG tablet cetirizine 10 mg tablet  Cetirizine HCl 10 MG Oral Tablet QTY: 90 tablet Days: 90  Refills: 0  Written: 05/23/19 Patient Instructions: once a day 05/23/19  Yes [provider]  chlorthalidone (HYGROTON) 25 MG tablet Take 12.5 mg by mouth every morning.    Yes [provider]  clonazePAM (KLONOPIN) 0.5 MG tablet Take 1 tablet (0.5 mg total) by mouth 3 (three)  times daily as needed for up to 5 days for anxiety. 02/06/22 09/03/22 Yes Irean Hong, MD  diltiazem (CARDIZEM CD) 120 MG 24 hr capsule Take by mouth. 06/02/22  Yes [provider]  enalapril (VASOTEC) 20 MG tablet Take 20 mg by mouth 2 (two) times daily.    Yes [provider]  estradiol (ESTRACE) 0.5 MG tablet Take 0.5 mg by mouth daily.   Yes [provider]  furosemide (LASIX) 20 MG tablet Take by mouth. 01/31/22  Yes [provider]  gabapentin (NEURONTIN) 300 MG capsule Take 1 in the AM and 3 capsules at night. 03/25/18  Yes [provider]  isosorbide mononitrate (IMDUR) 30 MG 24 hr tablet Take by mouth. 01/31/21  Yes [provider]  Melatonin 10 MG TABS Take 10 mg by mouth at bedtime.    Yes [provider]  metoprolol succinate (TOPROL-XL) 50 MG 24 hr tablet Take by mouth. 06/02/22  Yes [provider]  ondansetron (ZOFRAN) 4 MG tablet Take 4 mg by mouth every 8 (eight) hours as needed for nausea.  12/01/16  Yes [provider]  pantoprazole (PROTONIX) 40 MG tablet 40 mg. 08/28/17  Yes [provider]  ranolazine (RANEXA) 500 MG 12 hr tablet Take 500 mg by mouth 2 (two) times daily. 08/18/22  Yes [provider]  rosuvastatin (CRESTOR) 40 MG tablet Take 1 tablet by mouth daily. 01/18/19  Yes [provider]  sertraline (ZOLOFT) 100 MG tablet Take 1 tablet by mouth daily.  09/09/16  Yes [provider]  sotalol (BETAPACE) 80 MG tablet Take 1 tablet by mouth daily. 01/28/22  Yes [provider]  tiZANidine (ZANAFLEX) 2 MG tablet Take by mouth. 08/19/22  Yes [provider]  Vitamin D, Ergocalciferol, (DRISDOL) 50000 units CAPS capsule  11/06/17  Yes [provider]    Allergies: No Known Allergies  Social History:  reports that she has quit smoking. She has never used smokeless tobacco. She reports current alcohol use. She reports that she does not use drugs.    Family History: Family History  Problem Relation Age of Onset   Breast cancer Sister 58    Review of Systems: Review of Systems  Constitutional:  Negative for chills and fever.  HENT:  Negative for hearing loss.   Respiratory:  Negative for shortness of breath.   Cardiovascular:  Negative for chest pain.  Gastrointestinal:  Positive for abdominal pain and nausea. Negative for diarrhea and vomiting.  Genitourinary:  Negative for dysuria.  Musculoskeletal:  Negative for myalgias.  Skin:  Negative for rash.  Neurological:  Negative for dizziness.  Psychiatric/Behavioral:  Negative for depression.     Physical Exam BP (!) 174/84   Pulse 94   Temp 98 F (36.7 C) (Oral)   Ht 5\' 5"  (1.651 m)   Wt 166 lb (75.3 kg)   SpO2 97%   BMI 27.62 kg/m  CONSTITUTIONAL: No acute distress, well nourished. HEENT:  Normocephalic, atraumatic, extraocular motion intact. NECK: Trachea is midline, and there is no jugular venous distension.  RESPIRATORY:  Lungs are clear, and breath sounds are equal bilaterally. Normal respiratory effort without pathologic use of accessory muscles.  CARDIOVASCULAR: Heart is regular without murmurs, gallops, or rubs. GI: The abdomen is soft, non-distended, with tenderness in the RUQ and epigastric area.  Negative Murphy's sign. MUSCULOSKELETAL:  Normal muscle strength and tone in all four extremities.  No peripheral edema or cyanosis. SKIN: Skin turgor is normal. There are no pathologic skin lesions.  NEUROLOGIC:  Motor and sensation is grossly normal.  Cranial nerves are grossly intact. PSYCH:  Alert and oriented to person, place and time. Affect is normal.  Laboratory Analysis: Labs from 02/06/22: Na 133, K 3.5, Cl 96, CO2 25, BUN 15, Cr 0.99.  Total bili 0.6, AST 25, ALT 14, Alk Phos 76.  WBC 9, Hgb 11.6, Hct 35.6, Plt 272.  Imaging: Ultrasound RUQ on 08/20/22: FINDINGS: Gallbladder: Multiple stones in the gallbladder lumen. No gallbladder wall thickening or  pericholecystic fluid. Negative sonographic Murphy's sign.   Common bile duct: Diameter: 2.7 mm   Liver: No focal lesion identified. Within normal limits in parenchymal echogenicity. Portal vein is patent on color Doppler imaging with normal direction of blood flow towards the liver.   Other: None.   IMPRESSION: Cholelithiasis without secondary signs of acute cholecystitis.    Assessment and Plan: This is a 67 y.o. female with symptomatic cholelithiasis  --Discussed with the patient the findings on her ultrasound and how gallstones cause episodes of biliary colic.  Discussed the relationship with greasy/fatty foods, and that unfortunately there is no good medical way to treat this.  She already overall eats a low fat diet, so do not think further dietary changes are going to be of much help.  Medications to dissolve stones take a very long time to exert effect.  Surgery is the treatment of choice.  She's in agreement.  She wants to take a trip to [place] and is worried that her gallbladder may get worse while she's out of country. --Discussed with her the plan for a robotic assisted cholecystectomy and reviewed the surgery at length with her including the risks of bleeding, infection, injury to surrounding structures, the use of ICG to better evaluate the biliary anatomy, that this would be an outpatient surgery, post-operative activity restrictions, pain control, and she's willing to proceed. --Will schedule her for surgery on 09/18/22.  Will send for cardiology clearance.  She's aware that if any further testing is needed, may need to postpone surgery date.  Otherwise, last dose of Aspirin would be on 09/12/22.  I spent 55 minutes dedicated to the care of this patient on the date of this encounter to include pre-visit review of records, face-to-face time with the patient discussing diagnosis and management, and any post-visit coordination of care.   Howie Ill, MD [facility]

## 2022-09-04 ENCOUNTER — Telehealth: Payer: Self-pay

## 2022-09-04 ENCOUNTER — Telehealth: Payer: Self-pay | Admitting: Surgery

## 2022-09-04 NOTE — Telephone Encounter (Signed)
Patient has been advised of Pre-Admission date/time, and Surgery date at [facility].  Surgery Date: 09/18/22 Preadmission Testing Date: 09/09/22 (phone 1p-4p)  Patient has been made aware to call [phone], between 1-3:00pm the day before surgery, to find out what time to arrive for surgery.

## 2022-09-04 NOTE — Telephone Encounter (Signed)
Faxed cardiac clearance to Dr. Claris Gower Syded at [phone].

## 2022-09-08 DIAGNOSIS — K8020 Calculus of gallbladder without cholecystitis without obstruction: Secondary | ICD-10-CM | POA: Insufficient documentation

## 2022-09-08 DIAGNOSIS — R110 Nausea: Secondary | ICD-10-CM | POA: Insufficient documentation

## 2022-09-09 ENCOUNTER — Encounter
Admission: RE | Admit: 2022-09-09 | Discharge: 2022-09-09 | Disposition: A | Discharge status: Home / Self Care | Payer: Medicare HMO | Source: Ambulatory Visit | Attending: Surgery | Admitting: Surgery

## 2022-09-09 VITALS — Ht 65.0 in | Wt 165.0 lb

## 2022-09-09 DIAGNOSIS — Z01812 Encounter for preprocedural laboratory examination: Secondary | ICD-10-CM

## 2022-09-09 DIAGNOSIS — I5181 Takotsubo syndrome: Secondary | ICD-10-CM

## 2022-09-09 DIAGNOSIS — I10 Essential (primary) hypertension: Secondary | ICD-10-CM

## 2022-09-09 HISTORY — DX: Other specified bacterial intestinal infections: A04.8

## 2022-09-09 HISTORY — DX: Diverticulosis of intestine, part unspecified, without perforation or abscess without bleeding: K57.90

## 2022-09-09 HISTORY — DX: Supraventricular tachycardia, unspecified: I47.10

## 2022-09-09 HISTORY — DX: Obstructive sleep apnea (adult) (pediatric): G47.33

## 2022-09-09 HISTORY — DX: Cyst of kidney, acquired: N28.1

## 2022-09-09 HISTORY — DX: Hypo-osmolality and hyponatremia: E87.1

## 2022-09-09 NOTE — Patient Instructions (Addendum)
Your procedure is scheduled on: Thursday, April 25 Report to the Registration Desk on the 1st floor of the [facility] To find out your arrival time, please call [phone] between 1PM - 3PM on: Wednesday, April 24 If your arrival time is 6:00 am, do not arrive before that time as the [facility] entrance doors do not open until 6:00 am.  REMEMBER: Instructions that are not followed completely may result in serious medical risk, up to and including death; or upon the discretion of your surgeon and anesthesiologist your surgery may need to be rescheduled.  Do not eat food after midnight the night before surgery.  No gum chewing or hard candies.  You may however, drink CLEAR liquids up to 2 hours before you are scheduled to arrive for your surgery. Do not drink anything within 2 hours of your scheduled arrival time.  Clear liquids include: - water  - apple juice without pulp - gatorade (not RED colors) - black coffee or tea (Do NOT add milk or creamers to the coffee or tea) Do NOT drink anything that is not on this list.  One week prior to surgery: starting April 18 Stop Anti-inflammatories (NSAIDS) such as Advil, Aleve, Ibuprofen, Motrin, Naproxen, Naprosyn and Aspirin based products such as Excedrin, Goody's Powder, BC Powder. Stop ANY OVER THE COUNTER supplements until after surgery. You may however, continue to take Tylenol if needed for pain up until the day of surgery.  Continue taking all prescribed medications with the exception of the following:  Aspirin - hold for 5 days before surgery. Last day to take is Friday April 19. Resume AFTER surgery per surgeon instruction.  TAKE ONLY THESE MEDICATIONS THE MORNING OF SURGERY WITH A SIP OF WATER:  Diltiazem Isosorbide mononitrate Metoprolol Pantoprazole - (take one the night before and one on the morning of surgery - helps to prevent nausea after surgery.) Ranolazine Rosuvastatin Sotalol  No Alcohol for 24 hours before  or after surgery.  No Smoking including e-cigarettes for 24 hours before surgery.  No chewable tobacco products for at least 6 hours before surgery.  No nicotine patches on the day of surgery.  Do not use any "recreational" drugs for at least a week (preferably 2 weeks) before your surgery.  Please be advised that the combination of cocaine and anesthesia may have negative outcomes, up to and including death. If you test positive for cocaine, your surgery will be cancelled.  On the morning of surgery brush your teeth with toothpaste and water, you may rinse your mouth with mouthwash if you wish. Do not swallow any toothpaste or mouthwash.  Use CHG Soap as directed on instruction sheet.  Do not wear jewelry, make-up, hairpins, clips or nail polish.  Do not wear lotions, powders, or perfumes.   Do not shave body hair from the neck down 48 hours before surgery.  Contact lenses, hearing aids and dentures may not be worn into surgery.  Do not bring valuables to the hospital. [facility] is not responsible for any missing/lost belongings or valuables.   Bring your C-PAP to the hospital in case you may have to spend the night.   Notify your doctor if there is any change in your medical condition (cold, fever, infection).  Wear comfortable clothing (specific to your surgery type) to the hospital.  After surgery, you can help prevent lung complications by doing breathing exercises.  Take deep breaths and cough every 1-2 hours. Your doctor may order a device called an Incentive  Spirometer to help you take deep breaths. When coughing or sneezing, hold a pillow firmly against your incision with both hands. This is called "splinting." Doing this helps protect your incision. It also decreases belly discomfort.  If you are being discharged the day of surgery, you will not be allowed to drive home. You will need a responsible individual to drive you home and stay with you for 24 hours after  surgery.   If you are taking public transportation, you will need to have a responsible individual with you.  Please call the Pre-admissions Testing Dept. at [phone] if you have any questions about these instructions.  Surgery Visitation Policy:  Patients having surgery or a procedure may have two visitors.  Children under the age of 21 must have an adult with them who is not the patient.     Preparing for Surgery with CHLORHEXIDINE GLUCONATE (CHG) Soap  Chlorhexidine Gluconate (CHG) Soap  o An antiseptic cleaner that kills germs and bonds with the skin to continue killing germs even after washing  o Used for showering the night before surgery and morning of surgery  Before surgery, you can play an important role by reducing the number of germs on your skin.  CHG (Chlorhexidine gluconate) soap is an antiseptic cleanser which kills germs and bonds with the skin to continue killing germs even after washing.  Please do not use if you have an allergy to CHG or antibacterial soaps. If your skin becomes reddened/irritated stop using the CHG.  1. Shower the NIGHT BEFORE SURGERY and the MORNING OF SURGERY with CHG soap.  2. If you choose to wash your hair, wash your hair first as usual with your normal shampoo.  3. After shampooing, rinse your hair and body thoroughly to remove the shampoo.  4. Use CHG as you would any other liquid soap. You can apply CHG directly to the skin and wash gently with a scrungie or a clean washcloth.  5. Apply the CHG soap to your body only from the neck down. Do not use on open wounds or open sores. Avoid contact with your eyes, ears, mouth, and genitals (private parts). Wash face and genitals (private parts) with your normal soap.  6. Wash thoroughly, paying special attention to the area where your surgery will be performed.  7. Thoroughly rinse your body with warm water.  8. Do not shower/wash with your normal soap after using and rinsing off  the CHG soap.  9. Pat yourself dry with a clean towel.  10. Wear clean pajamas to bed the night before surgery.  12. Place clean sheets on your bed the night of your first shower and do not sleep with pets.  13. Shower again with the CHG soap on the day of surgery prior to arriving at the hospital.  14. Do not apply any deodorants/lotions/powders.  15. Please wear clean clothes to the hospital.

## 2022-09-10 ENCOUNTER — Encounter: Payer: Self-pay | Admitting: Urgent Care

## 2022-09-10 ENCOUNTER — Encounter
Admission: RE | Admit: 2022-09-10 | Discharge: 2022-09-10 | Disposition: A | Discharge status: Home / Self Care | Payer: Medicare HMO | Source: Ambulatory Visit | Attending: Surgery | Admitting: Surgery

## 2022-09-10 DIAGNOSIS — Z01812 Encounter for preprocedural laboratory examination: Secondary | ICD-10-CM | POA: Diagnosis present

## 2022-09-10 DIAGNOSIS — I5181 Takotsubo syndrome: Secondary | ICD-10-CM

## 2022-09-10 DIAGNOSIS — I10 Essential (primary) hypertension: Secondary | ICD-10-CM | POA: Insufficient documentation

## 2022-09-10 LAB — BASIC METABOLIC PANEL
Anion gap: 7 (ref 5–15)
BUN: 22 mg/dL (ref 8–23)
CO2: 27 mmol/L (ref 22–32)
Calcium: 9.1 mg/dL (ref 8.9–10.3)
Chloride: 101 mmol/L (ref 98–111)
Creatinine, Ser: 0.94 mg/dL (ref 0.44–1.00)
GFR, Estimated: >60 mL/min (ref >60)
Glucose, Bld: 89 mg/dL (ref 70–99)
Potassium: 4.7 mmol/L (ref 3.5–5.1)
Sodium: 135 mmol/L (ref 135–145)

## 2022-09-10 LAB — CBC
HCT: 37.3 % (ref 36.0–46.0)
Hemoglobin: 11.8 g/dL — ABNORMAL LOW (ref 12.0–15.0)
MCH: 26 pg (ref 26.0–34.0)
MCHC: 31.6 g/dL (ref 30.0–36.0)
MCV: 82.3 fL (ref 80.0–100.0)
Platelets: 276 10*3/uL (ref 150–400)
RBC: 4.53 MIL/uL (ref 3.87–5.11)
RDW: 15.3 % (ref 11.5–15.5)
WBC: 6.8 10*3/uL (ref 4.0–10.5)
nRBC: 0 % (ref 0.0–0.2)

## 2022-09-11 ENCOUNTER — Other Ambulatory Visit: Payer: Self-pay

## 2022-09-11 ENCOUNTER — Observation Stay
Admission: EM | Admit: 2022-09-11 | Discharge: 2022-09-13 | Disposition: A | Discharge status: Home / Self Care | Payer: Medicare HMO | Attending: Internal Medicine | Admitting: Internal Medicine

## 2022-09-11 ENCOUNTER — Encounter: Payer: Self-pay | Admitting: Gastroenterology

## 2022-09-11 ENCOUNTER — Encounter: Payer: Self-pay | Admitting: Physician Assistant

## 2022-09-11 ENCOUNTER — Ambulatory Visit: Payer: Medicare HMO | Admitting: Gastroenterology

## 2022-09-11 ENCOUNTER — Emergency Department: Payer: Medicare HMO

## 2022-09-11 VITALS — BP 160/97 | HR 132 | Temp 97.9°F | Ht 65.0 in | Wt 165.6 lb

## 2022-09-11 DIAGNOSIS — R0789 Other chest pain: Secondary | ICD-10-CM | POA: Diagnosis not present

## 2022-09-11 DIAGNOSIS — A048 Other specified bacterial intestinal infections: Secondary | ICD-10-CM

## 2022-09-11 DIAGNOSIS — K8020 Calculus of gallbladder without cholecystitis without obstruction: Secondary | ICD-10-CM | POA: Diagnosis not present

## 2022-09-11 DIAGNOSIS — I10 Essential (primary) hypertension: Secondary | ICD-10-CM

## 2022-09-11 DIAGNOSIS — Z87891 Personal history of nicotine dependence: Secondary | ICD-10-CM | POA: Diagnosis not present

## 2022-09-11 DIAGNOSIS — I249 Acute ischemic heart disease, unspecified: Secondary | ICD-10-CM | POA: Diagnosis not present

## 2022-09-11 DIAGNOSIS — I2089 Other forms of angina pectoris: Secondary | ICD-10-CM | POA: Diagnosis not present

## 2022-09-11 DIAGNOSIS — Z79899 Other long term (current) drug therapy: Secondary | ICD-10-CM | POA: Insufficient documentation

## 2022-09-11 DIAGNOSIS — I25110 Atherosclerotic heart disease of native coronary artery with unstable angina pectoris: Principal | ICD-10-CM

## 2022-09-11 DIAGNOSIS — Z955 Presence of coronary angioplasty implant and graft: Secondary | ICD-10-CM | POA: Insufficient documentation

## 2022-09-11 DIAGNOSIS — I479 Paroxysmal tachycardia, unspecified: Secondary | ICD-10-CM | POA: Diagnosis not present

## 2022-09-11 DIAGNOSIS — Z7982 Long term (current) use of aspirin: Secondary | ICD-10-CM | POA: Diagnosis not present

## 2022-09-11 DIAGNOSIS — E785 Hyperlipidemia, unspecified: Secondary | ICD-10-CM

## 2022-09-11 DIAGNOSIS — F419 Anxiety disorder, unspecified: Secondary | ICD-10-CM

## 2022-09-11 DIAGNOSIS — R072 Precordial pain: Secondary | ICD-10-CM

## 2022-09-11 DIAGNOSIS — M79602 Pain in left arm: Secondary | ICD-10-CM | POA: Diagnosis present

## 2022-09-11 DIAGNOSIS — D649 Anemia, unspecified: Secondary | ICD-10-CM | POA: Insufficient documentation

## 2022-09-11 LAB — CBC
HCT: 37.3 % (ref 36.0–46.0)
Hemoglobin: 12.1 g/dL (ref 12.0–15.0)
MCH: 26.2 pg (ref 26.0–34.0)
MCHC: 32.4 g/dL (ref 30.0–36.0)
MCV: 80.9 fL (ref 80.0–100.0)
Platelets: 269 10*3/uL (ref 150–400)
RBC: 4.61 MIL/uL (ref 3.87–5.11)
RDW: 15.1 % (ref 11.5–15.5)
WBC: 8.4 10*3/uL (ref 4.0–10.5)
nRBC: 0 % (ref 0.0–0.2)

## 2022-09-11 LAB — BASIC METABOLIC PANEL
Anion gap: 7 (ref 5–15)
BUN: 19 mg/dL (ref 8–23)
CO2: 22 mmol/L (ref 22–32)
Calcium: 9.1 mg/dL (ref 8.9–10.3)
Chloride: 98 mmol/L (ref 98–111)
Creatinine, Ser: 0.93 mg/dL (ref 0.44–1.00)
GFR, Estimated: >60 mL/min (ref >60)
Glucose, Bld: 158 mg/dL — ABNORMAL HIGH (ref 70–99)
Potassium: 3.8 mmol/L (ref 3.5–5.1)
Sodium: 132 mmol/L — ABNORMAL LOW (ref 135–145)

## 2022-09-11 LAB — BRAIN NATRIURETIC PEPTIDE: B Natriuretic Peptide: 80.5 pg/mL (ref 0.0–100.0)

## 2022-09-11 LAB — TROPONIN I (HIGH SENSITIVITY)
Troponin I (High Sensitivity): 5 ng/L (ref <18)
Troponin I (High Sensitivity): 22 ng/L — ABNORMAL HIGH (ref <18)

## 2022-09-11 MED ORDER — CLONAZEPAM 0.5 MG PO TABS
0.5000 mg | ORAL_TABLET | Freq: Three times a day (TID) | ORAL | Status: DC | PRN
  Administered 2022-09-11 – 2022-09-12 (×3): 0.5 mg via ORAL
  Filled 2022-09-11 (×3): qty 1

## 2022-09-11 MED ORDER — MORPHINE SULFATE (PF) 2 MG/ML IV SOLN: 2.0000 mg | INTRAVENOUS | Status: DC | PRN

## 2022-09-11 MED ORDER — FUROSEMIDE 20 MG PO TABS: 10.0000 mg | ORAL_TABLET | Freq: Every day | ORAL | Status: DC | PRN

## 2022-09-11 MED ORDER — METOPROLOL SUCCINATE ER 50 MG PO TB24
50.0000 mg | ORAL_TABLET | Freq: Every day | ORAL | Status: DC
  Administered 2022-09-12 – 2022-09-13 (×2): 50 mg via ORAL
  Filled 2022-09-11 (×2): qty 1

## 2022-09-11 MED ORDER — ISOSORBIDE MONONITRATE ER 30 MG PO TB24
30.0000 mg | ORAL_TABLET | Freq: Every day | ORAL | Status: DC
  Administered 2022-09-12 – 2022-09-13 (×2): 30 mg via ORAL
  Filled 2022-09-11 (×2): qty 1

## 2022-09-11 MED ORDER — PANTOPRAZOLE SODIUM 40 MG PO TBEC
40.0000 mg | DELAYED_RELEASE_TABLET | Freq: Every day | ORAL | Status: DC
  Administered 2022-09-12 – 2022-09-13 (×2): 40 mg via ORAL
  Filled 2022-09-11 (×2): qty 1

## 2022-09-11 MED ORDER — TIZANIDINE HCL 2 MG PO TABS: 2.0000 mg | ORAL_TABLET | Freq: Three times a day (TID) | ORAL | Status: DC | PRN

## 2022-09-11 MED ORDER — ALPRAZOLAM 0.25 MG PO TABS: 0.2500 mg | ORAL_TABLET | Freq: Two times a day (BID) | ORAL | Status: DC | PRN

## 2022-09-11 MED ORDER — HEPARIN (PORCINE) 25000 UT/250ML-% IV SOLN
750.0000 [IU]/h | INTRAVENOUS | Status: DC
  Administered 2022-09-11: 850 [IU]/h via INTRAVENOUS
  Filled 2022-09-11: qty 250

## 2022-09-11 MED ORDER — NITROGLYCERIN 0.4 MG SL SUBL
0.4000 mg | SUBLINGUAL_TABLET | SUBLINGUAL | Status: DC | PRN
  Administered 2022-09-12 (×2): 0.4 mg via SUBLINGUAL
  Filled 2022-09-11 (×2): qty 1

## 2022-09-11 MED ORDER — GABAPENTIN 300 MG PO CAPS
900.0000 mg | ORAL_CAPSULE | Freq: Every day | ORAL | Status: DC
  Administered 2022-09-11 – 2022-09-12 (×2): 900 mg via ORAL
  Filled 2022-09-11 (×2): qty 3

## 2022-09-11 MED ORDER — ONDANSETRON 4 MG PO TBDP
4.0000 mg | ORAL_TABLET | Freq: Once | ORAL | Status: AC
  Administered 2022-09-11: 4 mg via ORAL
  Filled 2022-09-11: qty 1

## 2022-09-11 MED ORDER — ROSUVASTATIN CALCIUM 10 MG PO TABS
40.0000 mg | ORAL_TABLET | Freq: Every day | ORAL | Status: DC
  Administered 2022-09-11 – 2022-09-12 (×2): 40 mg via ORAL
  Filled 2022-09-11: qty 2
  Filled 2022-09-11: qty 4

## 2022-09-11 MED ORDER — VITAMIN D (ERGOCALCIFEROL) 1.25 MG (50000 UNIT) PO CAPS
50000.0000 [IU] | ORAL_CAPSULE | ORAL | Status: DC
  Filled 2022-09-11: qty 1

## 2022-09-11 MED ORDER — ASPIRIN 81 MG PO CHEW
81.0000 mg | CHEWABLE_TABLET | Freq: Every day | ORAL | Status: DC
  Administered 2022-09-12 – 2022-09-13 (×2): 81 mg via ORAL
  Filled 2022-09-11 (×2): qty 1

## 2022-09-11 MED ORDER — TRAZODONE HCL 50 MG PO TABS: 25.0000 mg | ORAL_TABLET | Freq: Every evening | ORAL | Status: DC | PRN

## 2022-09-11 MED ORDER — SODIUM CHLORIDE 0.9 % IV SOLN: INTRAVENOUS | Status: DC

## 2022-09-11 MED ORDER — DILTIAZEM HCL ER COATED BEADS 120 MG PO CP24
120.0000 mg | ORAL_CAPSULE | Freq: Every day | ORAL | Status: DC
  Administered 2022-09-12 – 2022-09-13 (×2): 120 mg via ORAL
  Filled 2022-09-11 (×2): qty 1

## 2022-09-11 MED ORDER — ACETAMINOPHEN 325 MG PO TABS
650.0000 mg | ORAL_TABLET | ORAL | Status: DC | PRN
  Administered 2022-09-12: 650 mg via ORAL
  Filled 2022-09-11: qty 2

## 2022-09-11 MED ORDER — RANOLAZINE ER 500 MG PO TB12
500.0000 mg | ORAL_TABLET | Freq: Two times a day (BID) | ORAL | Status: DC
  Administered 2022-09-12 (×2): 500 mg via ORAL
  Filled 2022-09-11 (×2): qty 1

## 2022-09-11 MED ORDER — ONDANSETRON HCL 4 MG/2ML IJ SOLN: 4.0000 mg | Freq: Four times a day (QID) | INTRAMUSCULAR | Status: DC | PRN

## 2022-09-11 MED ORDER — ENALAPRIL MALEATE 10 MG PO TABS
20.0000 mg | ORAL_TABLET | Freq: Every day | ORAL | Status: DC
  Administered 2022-09-12: 20 mg via ORAL
  Filled 2022-09-11: qty 2

## 2022-09-11 MED ORDER — NITROGLYCERIN 0.4 MG SL SUBL
0.4000 mg | SUBLINGUAL_TABLET | SUBLINGUAL | Status: DC | PRN
  Administered 2022-09-11: 0.4 mg via SUBLINGUAL
  Filled 2022-09-11: qty 1

## 2022-09-11 MED ORDER — ONDANSETRON HCL 4 MG PO TABS
4.0000 mg | ORAL_TABLET | Freq: Three times a day (TID) | ORAL | Status: DC | PRN
  Administered 2022-09-13: 4 mg via ORAL
  Filled 2022-09-11: qty 1

## 2022-09-11 MED ORDER — SOTALOL HCL 80 MG PO TABS
80.0000 mg | ORAL_TABLET | Freq: Every day | ORAL | Status: DC
  Administered 2022-09-12 – 2022-09-13 (×2): 80 mg via ORAL
  Filled 2022-09-11 (×2): qty 1

## 2022-09-11 MED ORDER — MELATONIN 5 MG PO TABS
10.0000 mg | ORAL_TABLET | Freq: Every day | ORAL | Status: DC
  Administered 2022-09-11 – 2022-09-12 (×2): 10 mg via ORAL
  Filled 2022-09-11 (×2): qty 2

## 2022-09-11 MED ORDER — MAGNESIUM HYDROXIDE 400 MG/5ML PO SUSP: 30.0000 mL | Freq: Every day | ORAL | Status: DC | PRN

## 2022-09-11 MED ORDER — ENOXAPARIN SODIUM 40 MG/0.4ML IJ SOSY: 40.0000 mg | PREFILLED_SYRINGE | INTRAMUSCULAR | Status: DC

## 2022-09-11 MED ORDER — HEPARIN BOLUS VIA INFUSION
4000.0000 [IU] | Freq: Once | INTRAVENOUS | Status: AC
  Administered 2022-09-11: 4000 [IU] via INTRAVENOUS
  Filled 2022-09-11: qty 4000

## 2022-09-11 MED ORDER — LORATADINE 10 MG PO TABS
10.0000 mg | ORAL_TABLET | Freq: Every day | ORAL | Status: DC
  Administered 2022-09-12 – 2022-09-13 (×2): 10 mg via ORAL
  Filled 2022-09-11 (×2): qty 1

## 2022-09-11 NOTE — Consult Note (Addendum)
ANTICOAGULATION CONSULT NOTE - Initial Consult  Pharmacy Consult for heparin Indication: chest pain/ACS  Allergies  Allergen Reactions   No Known Allergies Other (See Comments)    Patient Measurements:   Heparin Dosing Weight: 72.4 kg  Vital Signs: Temp: 97.9 F (36.6 C) (04/18 1830) Temp Source: Oral (04/18 1830) BP: 141/59 (04/18 1830) Pulse Rate: 59 (04/18 1830)  Labs: Recent Labs    09/10/22 1108 09/11/22 1450 09/11/22 1826  HGB 11.8* 12.1  --   HCT 37.3 37.3  --   PLT 276 269  --   CREATININE 0.94 0.93  --   TROPONINIHS  --  5 22*    Estimated Creatinine Clearance: 61.9 mL/min (by C-G formula based on SCr of 0.93 mg/dL).   Medical History: Past Medical History:  Diagnosis Date   Anxiety    CAD (coronary artery disease)    Diverticulosis    Female genuine stress incontinence 08/15/2014   GERD (gastroesophageal reflux disease)    H. 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   Tachycardia 12/20/2015   Takotsubo cardiomyopathy     Medications:  PTA: aspirin 81mg  PO daily Inpatient: Heparin infusion 4/18 >>> Allergies: NKDA  Assessment: 67 year old female with history of HLD, HTN, CAD, and prior MI (in 30) presents to ED with complaint of left arm pain, states felt similar with past MI. Troponin increased from 5 ng/L to 22 ng/L. Pharmacy consulted for management of heparin infusion in the setting of NSTEMI.  Goal of Therapy:  Heparin level 0.3-0.7 units/ml Monitor platelets by anticoagulation protocol: Yes    Plan:  Order STAT aPTT Give 4000 units bolus x1; then start heparin infusion at 850 units/hr Check anti-Xa level in 6 hours and daily once consecutively therapeutic. Continue to monitor H&H and platelets daily while on heparin gtt.   Selinda Eon Clinical Pharmacist 09/11/2022 7:48 PM

## 2022-09-11 NOTE — ED Triage Notes (Signed)
Pt to ED [facility] for left arm pain, states felt similar with past MI.  +nausea, dizziness.  States wants heart checked

## 2022-09-11 NOTE — ED Provider Notes (Signed)
[facility] Emergency Department Provider Note     Event Date/Time   First MD Initiated Contact with Patient 09/11/22 1740     (approximate)   History   Arm Pain   HPI  Jenny Lee is a 67 y.o. female with a history of GERD, Takotsubo cardiomyopathy, OSA, HTN, HLD, MI at age 4, presents to the ED for evaluation of left arm pain.  Patient reports onset of pain today while at her routine visit with a GI specialist at [facility] and presents to the ED via EMS for evaluation.  She would endorse that the pain in her left wrist felt similar to the pain she felt when she had her past MI.  She reports some intermittent nausea and dizziness.  She reports pain currently at a 6 out of 10.  She described diaphoresis as well as fullness to the throat at the time of onset.  Physical Exam   Triage Vital Signs: ED Triage Vitals  Enc Vitals Group     BP 09/11/22 1450 (!) 149/67     Pulse Rate 09/11/22 1450 73     Resp 09/11/22 1450 16     Temp 09/11/22 1450 97.7 F (36.5 C)     Temp Source 09/11/22 1450 Oral     SpO2 09/11/22 1450 99 %     Weight --      Height --      Head Circumference --      Peak Flow --      Pain Score 09/11/22 1448 6     Pain Loc --      Pain Edu? --      Excl. in GC? --     Most recent vital signs: Vitals:   09/11/22 1450 09/11/22 1830  BP: (!) 149/67 (!) 141/59  Pulse: 73 (!) 59  Resp: 16 18  Temp: 97.7 F (36.5 C) 97.9 F (36.6 C)  SpO2: 99% 97%    General Awake, no distress. anxious HEENT NCAT. PERRL. EOMI. No rhinorrhea. Mucous membranes are moist.  CV:  Good peripheral perfusion. RRR RESP:  Normal effort. CTA ABD:  No distention.    ED Results / Procedures / Treatments   Labs (all labs ordered are listed, but only abnormal results are displayed) Labs Reviewed  BASIC METABOLIC PANEL - Abnormal; Notable for the following components:      Result Value   Sodium 132 (*)    Glucose, Bld 158 (*)    All other components  within normal limits  TROPONIN I (HIGH SENSITIVITY) - Abnormal; Notable for the following components:   Troponin I (High Sensitivity) 22 (*)    All other components within normal limits  CBC  BRAIN NATRIURETIC PEPTIDE  TROPONIN I (HIGH SENSITIVITY)     EKG  Vent. rate 72 BPM PR interval 154 ms QRS duration 74 ms QT/QTcB 376/411 ms P-R-T axes 67 65 24 Normal sinus rhythm Nonspecific T wave abnormality Abnormal ECG When compared with ECG of 06-Feb-2022 00:33, Vent. rate has decreased BY 44 BPM  RADIOLOGY  I personally viewed and evaluated these images as part of my medical decision making, as well as reviewing the written report by the radiologist.  ED Provider Interpretation: No acute findings  DG Chest 2 View  Result Date: 09/11/2022 CLINICAL DATA:  Chest pain EXAM: CHEST - 2 VIEW COMPARISON:  CXR 02/06/22 FINDINGS: No pleural effusion. No pneumothorax. No focal airspace opacity. Normal cardiac and mediastinal contours. No radiographically apparent  displaced rib fractures. Visualized upper abdomen is unremarkable. Vertebral body heights are maintained. IMPRESSION: No focal airspace opacity Electronically Signed   By: Lorenza Cambridge M.D.   On: 09/11/2022 15:19     PROCEDURES:  Critical Care performed: Yes, see critical care procedure note(s)  Procedures CRITICAL CARE Performed by: Lissa Hoard   Total critical care time: 30 minutes  Critical care time was exclusive of separately billable procedures and treating other patients.  Critical care was necessary to treat or prevent imminent or life-threatening deterioration.  Critical care was time spent personally by me on the following activities: development of treatment plan with patient and/or surrogate as well as nursing, discussions with consultants, evaluation of patient's response to treatment, examination of patient, obtaining history from patient or surrogate, ordering and performing treatments and  interventions, ordering and review of laboratory studies, ordering and review of radiographic studies, pulse oximetry and re-evaluation of patient's condition.   MEDICATIONS ORDERED IN ED: Medications  nitroGLYCERIN (NITROSTAT) SL tablet 0.4 mg (has no administration in time range)  ondansetron (ZOFRAN-ODT) disintegrating tablet 4 mg (4 mg Oral Given 09/11/22 1847)     IMPRESSION / MDM / ASSESSMENT AND PLAN / ED COURSE  I reviewed the triage vital signs and the nursing notes.                              Differential diagnosis includes, but is not limited to, ACS, aortic dissection, pulmonary embolism, cardiac tamponade, pneumothorax, pneumonia, pericarditis, myocarditis, GI-related causes including esophagitis/gastritis, and musculoskeletal chest wall pain.     Patient's presentation is most consistent with acute presentation with potential threat to life or bodily function.  The patient is on the cardiac monitor to evaluate for evidence of arrhythmia and/or significant heart rate changes.  Patient's diagnosis is consistent with ACS versus atypical chest pain versus stable angina.  EKG without evidence of STEMI at this time.  Initial troponin normal at 5 with a repeat troponin elevated to 22.  Patient intermittently symptomatic noting some heart palpitations but denies any frank chest pain.  Given the patient's history of AMI at age 5 with a Takotsubo cardiomyopathy, we discussed the benefit of admission to the hospital service for observation for her atypical chest pain.  Patient and husband agreeable to the plan of admission and cardiology consultation.  Dr. Valente David will meet the patient to the hospital service and consult cardiology as appropriate.  ACS protocols were initiated in the ED.      FINAL CLINICAL IMPRESSION(S) / ED DIAGNOSES   Final diagnoses:  ACS (acute coronary syndrome)  Atypical chest pain     Rx / DC Orders   ED Discharge Orders     None        Note:   This document was prepared using Dragon voice recognition software and may include unintentional dictation errors.    Lissa Hoard, PA-C 09/11/22 1939    Pilar Jarvis, MD 09/12/22 [phone]

## 2022-09-11 NOTE — Assessment & Plan Note (Signed)
-   We will continue antihypertensives. 

## 2022-09-11 NOTE — ED Notes (Signed)
Pt given tray of food and po drink

## 2022-09-11 NOTE — H&P (Signed)
[place]   PATIENT NAME: Jenny Lee    MR#:  [id number]  DATE OF BIRTH:  07/13/1955  DATE OF ADMISSION:  09/11/2022  PRIMARY CARE PHYSICIAN: Sherrie Mustache, MD   Patient is coming from: Home  REQUESTING/REFERRING PHYSICIAN: Menshew, Charlesetta Ivory, PA-C   CHIEF COMPLAINT:   Chief Complaint  Patient presents with   Arm Pain    HISTORY OF PRESENT ILLNESS:  ALBIRTA RHINEHART is a 67 y.o. African-American female with medical history significant for coronary artery disease status post MI at age of 89 status post PCI and angioplasty, GERD, coronary artery disease, anxiety, diverticulosis, hypertension, dyslipidemia, OSA on CPAP, Takotsubo cardiomyopathy, who presented to the emergency room with acute onset of left wrist pain that felt like squeezing and tightness similar to her previous MRI however at that time it involved all her left arm.  She experienced associated neck tightness as well as nausea, palpitations diaphoresis and dyspnea.  She admitted to mild chills but did not have any measured fever.  She was seeing Dr. Tobi Bastos today for a GI appointment when this happened and was advised to come to the ED via EMS.  No cough or wheezing.  No bleeding diathesis.  No headache or dizziness or blurred vision.  ED Course: When the patient came to the ER, BP was 149/67 with otherwise normal vital signs.  Labs revealed mild hyponatremia of 132 and glucose of 158 with otherwise unremarkable BMP.  BN P was 80.5.  High sensitive troponin I was 5 and later 22.  CBC was within normal. EKG as reviewed by me : Normal sinus rhythm with a rate of 72 with T wave inferiorly. Imaging: Two-view chest x-ray showed no acute cardiopulmonary disease.  The patient was given a bolus infusion of IV heparin as well as 4 mg of IV Zofran.  She will be admitted to an observation cardiac telemetry bed for further evaluation and management. PAST MEDICAL HISTORY:   Past Medical History:  Diagnosis Date    Anxiety    CAD (coronary artery disease)    Diverticulosis    Female genuine stress incontinence 08/15/2014   GERD (gastroesophageal reflux disease)    H. 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   Tachycardia 12/20/2015   Takotsubo cardiomyopathy     PAST SURGICAL HISTORY:   Past Surgical History:  Procedure Laterality Date   ABDOMINAL HYSTERECTOMY  05/26/2002   CARDIAC CATHETERIZATION N/A 11/26/2015   Procedure: Left Heart Cath and Coronary Angiography;  Surgeon: Laurier Nancy, MD;  Location: [facility] INVASIVE CV LAB;  Service: Cardiovascular;  Laterality: N/A;   CERVICAL SPINE SURGERY  05/26/1996   fusion   CLAVICLE SURGERY Right    shaved bone   COLONOSCOPY WITH PROPOFOL N/A 12/23/2016   Procedure: COLONOSCOPY WITH PROPOFOL;  Surgeon: [name], MD;  Location: [facility] ENDOSCOPY;  Service: Endoscopy;  Laterality: N/A;   COLONOSCOPY WITH PROPOFOL N/A 06/26/2017   Procedure: COLONOSCOPY WITH PROPOFOL;  Surgeon: Pasty Spillers, MD;  Location: [facility] ENDOSCOPY;  Service: Gastroenterology;  Laterality: N/A;   CORONARY BALLOON ANGIOPLASTY  1998   ESOPHAGOGASTRODUODENOSCOPY (EGD) WITH PROPOFOL N/A 12/23/2016   Procedure: ESOPHAGOGASTRODUODENOSCOPY (EGD) WITH PROPOFOL;  Surgeon: [name], MD;  Location: [facility] ENDOSCOPY;  Service: Endoscopy;  Laterality: N/A;   ESOPHAGOGASTRODUODENOSCOPY (EGD) WITH PROPOFOL N/A 03/31/2017   Procedure: ESOPHAGOGASTRODUODENOSCOPY (EGD) WITH PROPOFOL WITH GASTRIC MAPPING;  Surgeon: [name], MD;  Location: [facility]  ENDOSCOPY;  Service: Gastroenterology;  Laterality: N/A;   ESOPHAGOGASTRODUODENOSCOPY (EGD) WITH PROPOFOL N/A 12/21/2017   Procedure: ESOPHAGOGASTRODUODENOSCOPY (EGD) WITH PROPOFOL;  Surgeon: [name], MD;  Location: [facility] ENDOSCOPY;  Service: Gastroenterology;  Laterality: N/A;   RENAL ANGIOGRAPHY Bilateral 03/02/2017   Procedure: RENAL ANGIOGRAPHY;  Surgeon: Annice Needy, MD;  Location: [facility] INVASIVE CV LAB;  Service: Cardiovascular;  Laterality: Bilateral;   VAGINAL PROLAPSE REPAIR      SOCIAL HISTORY:   Social History   Tobacco Use   Smoking status: Former    Packs/day: 0.00    Years: 0.00    Additional pack years: 0.00    Total pack years: 0.00    Types: Cigarettes   Smokeless tobacco: Never  Substance Use Topics   Alcohol use: Yes    Alcohol/week: 0.0 standard drinks of alcohol    Comment: occasional wine    FAMILY HISTORY:   Family History  Problem Relation Age of Onset   Breast cancer Sister 102    DRUG ALLERGIES:   Allergies  Allergen Reactions   No Known Allergies Other (See Comments)    REVIEW OF SYSTEMS:   ROS As per history of present illness. All pertinent systems were reviewed above. Constitutional, HEENT, cardiovascular, respiratory, GI, GU, musculoskeletal, neuro, psychiatric, endocrine, integumentary and hematologic systems were reviewed and are otherwise negative/unremarkable except for positive findings mentioned above in the HPI.   MEDICATIONS AT HOME:   Prior to Admission medications   Medication Sig Start Date End Date Taking? Authorizing Provider  aspirin 81 MG tablet Take 81 mg by mouth daily.   Yes [provider]  celecoxib (CELEBREX) 100 MG capsule Take 100 mg by mouth 2 (two) times daily. 06/25/21  Yes [provider]  cetirizine (ZYRTEC) 10 MG tablet Take 10 mg by mouth daily.   Yes [provider]  clonazePAM (KLONOPIN) 0.5 MG tablet Take 1 tablet (0.5 mg total) by mouth 3 (three) times daily as needed for  up to 5 days for anxiety. 02/06/22 09/11/22 Yes Irean Hong, MD  diltiazem (CARDIZEM CD) 120 MG 24 hr capsule Take 120 mg by mouth daily. 06/02/22  Yes [provider]  enalapril (VASOTEC) 20 MG tablet Take 20 mg by mouth daily.   Yes [provider]  estradiol (ESTRACE) 0.5 MG tablet Take 0.5 mg by mouth daily.   Yes [provider]  furosemide (LASIX) 20 MG tablet Take 10 mg by mouth daily as needed. 01/31/22  Yes [provider]  gabapentin (NEURONTIN) 300 MG capsule Take 900 mg by mouth at bedtime. 03/25/18  Yes [provider]  isosorbide mononitrate (IMDUR) 30 MG 24 hr tablet Take 30 mg by mouth daily. 01/31/21  Yes [provider]  Melatonin 10 MG TABS Take 10 mg by mouth at bedtime.    Yes [provider]  metoprolol succinate (TOPROL-XL) 50 MG 24 hr tablet Take 50 mg by mouth daily. 06/02/22  Yes [provider]  ondansetron (ZOFRAN) 4 MG tablet Take 4 mg by mouth every 8 (eight) hours as needed for  nausea.  12/01/16  Yes [provider]  pantoprazole (PROTONIX) 40 MG tablet 40 mg daily. 08/28/17  Yes [provider]  ranolazine (RANEXA) 500 MG 12 hr tablet Take 500 mg by mouth 2 (two) times daily. 08/18/22  Yes [provider]  rosuvastatin (CRESTOR) 40 MG tablet Take 1 tablet by mouth daily. 01/18/19  Yes [provider]  sotalol (BETAPACE) 80 MG tablet Take 1 tablet by mouth daily. 01/28/22  Yes [provider]  tiZANidine (ZANAFLEX) 2 MG tablet Take 2 mg by mouth every 8 (eight) hours as needed. 08/19/22  Yes [provider]  Vitamin D, Ergocalciferol, (DRISDOL) 50000 units CAPS capsule Take 50,000 Units by mouth every 7 (seven) days. Saturday 11/06/17   [provider]      VITAL SIGNS:  Blood pressure 122/64, pulse 67, temperature 97.9 F (36.6 C), temperature source Oral, resp. rate 17, SpO2 97 %.  PHYSICAL EXAMINATION:  Physical Exam  GENERAL:  67  y.o.-year-old African-American female patient lying in the bed with no acute distress.  EYES: Pupils equal, round, reactive to light and accommodation. No scleral icterus. Extraocular muscles intact.  HEENT: Head atraumatic, normocephalic. Oropharynx and nasopharynx clear.  NECK:  Supple, no jugular venous distention. No thyroid enlargement, no tenderness.  LUNGS: Normal breath sounds bilaterally, no wheezing, rales,rhonchi or crepitation. No use of accessory muscles of respiration.  CARDIOVASCULAR: Regular rate and rhythm, S1, S2 normal. No murmurs, rubs, or gallops.  ABDOMEN: Soft, nondistended, nontender. Bowel sounds present. No organomegaly or mass.  EXTREMITIES: No pedal edema, cyanosis, or clubbing.  NEUROLOGIC: Cranial nerves II through XII are intact. Muscle strength 5/5 in all extremities. Sensation intact. Gait not checked.  PSYCHIATRIC: The patient is alert and oriented x 3.  Normal affect and good eye contact. SKIN: No obvious rash, lesion, or ulcer.   LABORATORY PANEL:   CBC Recent Labs  Lab 09/11/22 1450  WBC 8.4  HGB 12.1  HCT 37.3  PLT 269   ------------------------------------------------------------------------------------------------------------------  Chemistries  Recent Labs  Lab 09/11/22 1450  NA 132*  K 3.8  CL 98  CO2 22  GLUCOSE 158*  BUN 19  CREATININE 0.93  CALCIUM 9.1   ------------------------------------------------------------------------------------------------------------------  Cardiac Enzymes No results for input(s): "TROPONINI" in the last 168 hours. ------------------------------------------------------------------------------------------------------------------  RADIOLOGY:  DG Chest 2 View  Result Date: 09/11/2022 CLINICAL DATA:  Chest pain EXAM: CHEST - 2 VIEW COMPARISON:  CXR 02/06/22 FINDINGS: No pleural effusion. No pneumothorax. No focal airspace opacity. Normal cardiac and mediastinal contours. No radiographically apparent  displaced rib fractures. Visualized upper abdomen is unremarkable. Vertebral body heights are maintained. IMPRESSION: No focal airspace opacity Electronically Signed   By: Lorenza Cambridge M.D.   On: 09/11/2022 15:19      IMPRESSION AND PLAN:  Assessment and Plan: * Atypical angina - The patient will be admitted to an observation cardiac telemetry bed. - Will follow serial troponins and EKGs. - We will continue IV heparin drip for now given concern about acute coronary syndrome.. - The patient will be placed on aspirin as well as p.r.n. sublingual nitroglycerin and morphine sulfate for pain. - Will continue MGR, Ranexa, Toprol-XL and statin therapy. - We will obtain a cardiology consult in a.m. for further cardiac risk stratification. - I notified Dr. Lalla Brothers about the patient.  Essential hypertension - We will continue antihypertensives.  Dyslipidemia - We will continue statin therapy.  Anxiety - We will continue as needed Klonopin   DVT prophylaxis: IV heparin. [facility]  Planning:  Code Status: full code. Family Communication:  The plan of care was discussed in details with the patient (and family). I answered all questions. The patient agreed to proceed with the above mentioned plan. Further management will depend upon hospital course. Disposition Plan: Back to previous home environment Consults called: Cardiology All the records are reviewed and case discussed with ED provider.  Status is: Observation  I certify that at the time of admission, it is my clinical judgment that the patient will require hospital care extending less than 2 midnights.                            Dispo: The patient is from: Home              Anticipated d/c is to: Home              Patient currently is not medically stable to d/c.              Difficult to place patient: No  Hannah Beat M.D on 09/11/2022 at 11:03 PM  [facility]   From 7 PM-7 AM, contact  night-coverage [email or web address]  CC: Primary care physician; Sherrie Mustache, MD

## 2022-09-11 NOTE — Assessment & Plan Note (Signed)
-   We will continue as needed Klonopin

## 2022-09-11 NOTE — Progress Notes (Signed)
[name] MD, MRCP(U.K) [address]  [address]  [place], [place] [address]  Main: [phone]  Fax: [phone]   Gastroenterology Consultation  Referring Provider:     Sherrie Mustache, MD Primary Care Physician:  Sherrie Mustache, MD Primary Gastroenterologist:  Dr. [name]  Reason for Consultation:    Referred for GERD and colonoscopy        HPI:   Jenny Lee is a 67 y.o. y/o female referred for consultation & management  by Dr. Sherrie Mustache, MD.      Summary of history :    She was last seen in 2020 at this office for abdominal pain. Known to have had gall stones, LUQ pain , worse at night . Dr Servando Snare felt the pain was musculoskeletal in nature. Seen by Dr Excell Seltzer who felt surgery would not be recommended.    Colonoscopy 07/15/2017 4 sessile polyps resected and sigmoid colon ascending and cecum 2 to 4 mm in size.  5 mm polyp resected in the sigmoid colon.The polyps were tubular adenomas.  EGD: Treated for H pylori with Pylera([failed rx in 11/2016 with clarithromycin based therapy ) did not clear then treated with clarithromycin amoxicillin Flagyl PPI, failed referred to ID and had recurrence treated with rifabutin based therapy., gastric intestinal metaplasia noted.   MR abdomen 02/2017 normal HIDA scan -normal  06/04/17- H pylori stool antigen positive 06/26/17 - Colonoscopy - Tubular adenomas x 2 resected   Took 14 days of levofloxacin based therapy and did not clear either . Then treated with combination of clarithromycin, Amoxicillin , Flagyl, PPI. Referred to ID, they suggested EGD+biopsies and check for culture and sensitivity. EGD+ cultures showed no H pylori but repeat stool antigen test was positive,  Interval history 2022-2024    Seen and managed at [facility] found to have constipation IBS-C commenced on Linzess.  She had an EGD performed in November 2022 at [facility] found to have H. pylori and prescribed Pylera but the last message I can see is that they  are awaiting authorization for Pylera.  09/03/2022 seen by Dr. Aleen Campi for symptomatic cholelithiasis.  And scheduled for a cholecystectomy.  08/20/2022 ultrasound abdomen shows cholelithiasis  09/10/2022 hemoglobin 11.8 g MCV 82 creatinine 0.94.  When I went into the room to see her she says that she is feeling chest tightness discomfort on her left side of her body particularly the arm very similar to how she felt when she was having a heart attack.  Going on for the past 1 hour at least if not longer.  She was in tears and breathing heavily.  She did however say that at [facility] she did not receive treatment for H. pylori they did not call her back and she did not call them back  Past Medical History:  Diagnosis Date   Anxiety    CAD (coronary artery disease)    Diverticulosis    Female genuine stress incontinence 08/15/2014   GERD (gastroesophageal reflux disease)    H. pylori infection    H/O acute myocardial infarction 08/15/2014   Overview:  2002    Heart attack 1998   HLD (hyperlipidemia) 12/20/2015   HTN (hypertension) 12/20/2015   Hx of cardiac cath 12/11/2015   Overview:  Normal coronary arteries 2017   Hypercholesteremia    Hypertension    Hyponatremia    Kidney cysts    Mastalgia 10/06/2012   Obstructive sleep apnea on CPAP    Paroxysmal SVT (supraventricular tachycardia)  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   Tachycardia 12/20/2015   Takotsubo cardiomyopathy     Past Surgical History:  Procedure Laterality Date   ABDOMINAL HYSTERECTOMY  05/26/2002   CARDIAC CATHETERIZATION N/A 11/26/2015   Procedure: Left Heart Cath and Coronary Angiography;  Surgeon: Laurier Nancy, MD;  Location: [facility] INVASIVE CV LAB;  Service: Cardiovascular;  Laterality: N/A;   CERVICAL SPINE SURGERY  05/26/1996   fusion   CLAVICLE SURGERY Right    shaved bone   COLONOSCOPY WITH PROPOFOL N/A 12/23/2016   Procedure: COLONOSCOPY WITH PROPOFOL;  Surgeon: [name], MD;   Location[facility] ENDOSCOPY;  Service: Endoscopy;  Laterality: N/A;   COLONOSCOPY WITH PROPOFOL N/A 06/26/2017   Procedure: COLONOSCOPY WITH PROPOFOL;  Surgeon: Pasty Spillers, MD;  Location: [facility] ENDOSCOPY;  Service: Gastroenterology;  Laterality: N/A;   CORONARY BALLOON ANGIOPLASTY  1998   ESOPHAGOGASTRODUODENOSCOPY (EGD) WITH PROPOFOL N/A 12/23/2016   Procedure: ESOPHAGOGASTRODUODENOSCOPY (EGD) WITH PROPOFOL;  Surgeon: [name], MD;  Location: [facility] ENDOSCOPY;  Service: Endoscopy;  Laterality: N/A;   ESOPHAGOGASTRODUODENOSCOPY (EGD) WITH PROPOFOL N/A 03/31/2017   Procedure: ESOPHAGOGASTRODUODENOSCOPY (EGD) WITH PROPOFOL WITH GASTRIC MAPPING;  Surgeon: [name], MD;  Location: [facility] ENDOSCOPY;  Service: Gastroenterology;  Laterality: N/A;   ESOPHAGOGASTRODUODENOSCOPY (EGD) WITH PROPOFOL N/A 12/21/2017   Procedure: ESOPHAGOGASTRODUODENOSCOPY (EGD) WITH PROPOFOL;  Surgeon: [name], MD;  Location: [facility] ENDOSCOPY;  Service: Gastroenterology;  Laterality: N/A;   RENAL ANGIOGRAPHY Bilateral 03/02/2017   Procedure: RENAL ANGIOGRAPHY;  Surgeon: Annice Needy, MD;  Location: [facility] INVASIVE CV LAB;  Service: Cardiovascular;  Laterality: Bilateral;   VAGINAL PROLAPSE REPAIR      Prior to Admission medications   Medication Sig Start Date End Date Taking? Authorizing Provider  aspirin 81 MG tablet Take 81 mg by mouth daily.    [provider]  celecoxib (CELEBREX) 100 MG capsule Take 100 mg by mouth 2 (two) times daily. 06/25/21   [provider]  cetirizine (ZYRTEC) 10 MG tablet Take 10 mg by mouth daily.    [provider]  clonazePAM (KLONOPIN) 0.5 MG tablet Take 1 tablet (0.5 mg total) by mouth 3 (three) times daily as needed for up to 5 days for anxiety. 02/06/22 09/09/22  Irean Hong, MD  diltiazem (CARDIZEM CD) 120 MG 24 hr capsule Take 120 mg by mouth daily. 06/02/22   [provider]  enalapril (VASOTEC) 20 MG tablet Take 20 mg by mouth daily.    [provider]  estradiol (ESTRACE) 0.5 MG tablet Take 0.5 mg by mouth daily.    [provider]  furosemide (LASIX) 20 MG tablet Take 10 mg by mouth daily as needed. 01/31/22   [provider]  gabapentin (NEURONTIN) 300 MG capsule Take 900 mg by mouth at bedtime. 03/25/18   [provider]  isosorbide mononitrate (IMDUR) 30 MG 24 hr tablet Take 30 mg by mouth daily. 01/31/21   [provider]  Melatonin 10 MG TABS Take 10 mg by mouth at bedtime.     [provider]  metoprolol succinate (TOPROL-XL) 50 MG 24 hr tablet Take 50 mg by mouth daily. 06/02/22   [provider]  ondansetron (ZOFRAN) 4 MG tablet Take 4 mg by mouth every 8 (eight) hours as needed for nausea.  12/01/16   [provider]  pantoprazole (PROTONIX) 40 MG tablet 40 mg daily. 08/28/17   [provider]  ranolazine (RANEXA) 500 MG 12 hr tablet Take 500 mg by mouth 2 (  two) times daily. 08/18/22   [provider]  rosuvastatin (CRESTOR) 40 MG tablet Take 1 tablet by mouth daily. 01/18/19   [provider]  sotalol (BETAPACE) 80 MG tablet Take 1 tablet by mouth daily. 01/28/22   [provider]  tiZANidine (ZANAFLEX) 2 MG tablet Take 2 mg by mouth every 8 (eight) hours as needed. 08/19/22   [provider]  Vitamin D, Ergocalciferol, (DRISDOL) 50000 units CAPS capsule Take 50,000 Units by mouth every 7 (seven) days. Saturday 11/06/17   [provider]    Family History  Problem Relation Age of Onset   Breast cancer Sister 81     Social History   Tobacco Use   Smoking status: Former    Packs/day: 0.00    Years: 0.00    Additional pack years: 0.00    Total pack years: 0.00    Types: Cigarettes   Smokeless tobacco: Never  Vaping Use   Vaping Use: Never used  Substance Use Topics   Alcohol use: Yes    Alcohol/week: 0.0 standard drinks of alcohol    Comment: occasional wine   Drug use: No    Allergies as of 09/11/2022  - Review Complete 09/11/2022  Allergen Reaction Noted   No known allergies Other (See Comments) 09/08/2022    Review of Systems:    All systems reviewed and negative except where noted in HPI.   Physical Exam:  BP (!) 160/97   Pulse (!) 132   Temp 97.9 F (36.6 C)   Ht  (1.651 m)   Wt 165 lb 9.6 oz (75.1 kg)   BMI 27.56 kg/m  No LMP recorded. Patient has had a hysterectomy. Psych: Appears uncomfortable in tears complains of chest pain General:   Alert,  Well-developed, well-nourished Head:  Normocephalic and atraumatic. Eyes:  Sclera clear, no icterus.   Conjunctiva pink. Ears:  Normal auditory acuity. Neck:  Supple; no masses or thyromegaly. Lungs:  Respirations even and unlabored.  Clear throughout to auscultation.   No wheezes, crackles, or rhonchi. No acute distress. Heart:  Regular rate and rhythm; no murmurs, clicks, rubs, or gallops.   Neurologic:  Alert and oriented x3;  grossly normal neurologically. Psych:  Alert and cooperative. Normal mood and affect.  Imaging Studies: US Abdomen Limited RUQ (LIVER/GB)  Result Date: 08/20/2022 CLINICAL DATA:  Right upper quadrant pain for 1 week. EXAM: ULTRASOUND ABDOMEN LIMITED RIGHT UPPER QUADRANT COMPARISON:  CT chest 09/15/2018 FINDINGS: Gallbladder: Multiple stones in the gallbladder lumen. No gallbladder wall thickening or pericholecystic fluid. Negative sonographic Murphy's sign. Common bile duct: Diameter: 2.7 mm Liver: No focal lesion identified. Within normal limits in parenchymal echogenicity. Portal vein is patent on color Doppler imaging with normal direction of blood flow towards the liver. Other: None. IMPRESSION: Cholelithiasis without secondary signs of acute cholecystitis. Electronically Signed   By: Annia Belt M.D.   On: 08/20/2022 09:17    Assessment and Plan:   Jenny Lee is a 67 y.o. y/o female who was last seen at our office over 4 years back and had a history of abdominal pain, constipation H. pylori  that is failed eradication with multiple lines of therapy including levofloxacin, rifabutin based therapy here to reestablish care with [place].  For the last 3 years she was followed at [facility].  Tubular adenomas in the colon 5 years back due for surveillance.  Office visit could not be completed as she complained of chest pain tightness as soon as I entered  the room she felt like she was having a heart attack.  She has a history of coronary artery disease.  Her heart rate was high over 140 but appeared regular we do not have an EKG machine in this office called the ambulance to take her to the emergency room to rule out a heart attack versus an anxiety attack.  Patient should come back and see [place] once his acute event has resolved at a different appointment date.   Dr [name] MD,MRCP(U.K)

## 2022-09-11 NOTE — Assessment & Plan Note (Signed)
-   We will continue statin therapy. 

## 2022-09-11 NOTE — Assessment & Plan Note (Addendum)
-   The patient will be admitted to an observation cardiac telemetry bed. - Will follow serial troponins and EKGs. - We will continue IV heparin drip for now given concern about acute coronary syndrome.. - The patient will be placed on aspirin as well as p.r.n. sublingual nitroglycerin and morphine sulfate for pain. - Will continue MGR, Ranexa, Toprol-XL and statin therapy. - We will obtain a cardiology consult in a.m. for further cardiac risk stratification. - I notified Dr. Lalla Brothers about the patient.

## 2022-09-12 ENCOUNTER — Observation Stay (HOSPITAL_BASED_OUTPATIENT_CLINIC_OR_DEPARTMENT_OTHER)
Admit: 2022-09-12 | Discharge: 2022-09-12 | Disposition: A | Discharge status: Home / Self Care | Payer: Medicare HMO | Attending: Cardiovascular Disease | Admitting: Cardiovascular Disease

## 2022-09-12 ENCOUNTER — Encounter: Payer: Self-pay | Admitting: Family Medicine

## 2022-09-12 DIAGNOSIS — I479 Paroxysmal tachycardia, unspecified: Secondary | ICD-10-CM

## 2022-09-12 DIAGNOSIS — I2089 Other forms of angina pectoris: Secondary | ICD-10-CM | POA: Diagnosis not present

## 2022-09-12 DIAGNOSIS — M79602 Pain in left arm: Secondary | ICD-10-CM | POA: Diagnosis not present

## 2022-09-12 DIAGNOSIS — I10 Essential (primary) hypertension: Secondary | ICD-10-CM | POA: Diagnosis not present

## 2022-09-12 DIAGNOSIS — R079 Chest pain, unspecified: Secondary | ICD-10-CM

## 2022-09-12 DIAGNOSIS — I25110 Atherosclerotic heart disease of native coronary artery with unstable angina pectoris: Secondary | ICD-10-CM | POA: Diagnosis not present

## 2022-09-12 LAB — ECHOCARDIOGRAM COMPLETE
AR max vel: 1.77 cm2
AV Area VTI: 1.67 cm2
AV Area mean vel: 1.57 cm2
AV Mean grad: 6 mmHg
AV Peak grad: 12.3 mmHg
Ao pk vel: 1.75 m/s
Area-P 1/2: 3.1 cm2
Height: 65 in
MV VTI: 2.07 cm2
P 1/2 time: 667 msec
S' Lateral: 2.7 cm
Weight: 2649.6 oz

## 2022-09-12 LAB — LIPID PANEL
Cholesterol: 155 mg/dL (ref 0–200)
HDL: 54 mg/dL (ref >40)
LDL Cholesterol: 91 mg/dL (ref 0–99)
Total CHOL/HDL Ratio: 2.9 RATIO
Triglycerides: 52 mg/dL (ref <150)
VLDL: 10 mg/dL (ref 0–40)

## 2022-09-12 LAB — CBC
HCT: 33.6 % — ABNORMAL LOW (ref 36.0–46.0)
Hemoglobin: 10.8 g/dL — ABNORMAL LOW (ref 12.0–15.0)
MCH: 26.5 pg (ref 26.0–34.0)
MCHC: 32.1 g/dL (ref 30.0–36.0)
MCV: 82.4 fL (ref 80.0–100.0)
Platelets: 252 10*3/uL (ref 150–400)
RBC: 4.08 MIL/uL (ref 3.87–5.11)
RDW: 15.1 % (ref 11.5–15.5)
WBC: 7.8 10*3/uL (ref 4.0–10.5)
nRBC: 0 % (ref 0.0–0.2)

## 2022-09-12 LAB — TROPONIN I (HIGH SENSITIVITY): Troponin I (High Sensitivity): 12 ng/L (ref <18)

## 2022-09-12 LAB — HEPARIN LEVEL (UNFRACTIONATED): Heparin Unfractionated: 0.74 IU/mL — ABNORMAL HIGH (ref 0.30–0.70)

## 2022-09-12 MED ORDER — RANOLAZINE ER 500 MG PO TB12
1000.0000 mg | ORAL_TABLET | Freq: Two times a day (BID) | ORAL | Status: DC
  Administered 2022-09-12 – 2022-09-13 (×2): 1000 mg via ORAL
  Filled 2022-09-12 (×3): qty 2

## 2022-09-12 MED ORDER — ENOXAPARIN SODIUM 40 MG/0.4ML IJ SOSY
40.0000 mg | PREFILLED_SYRINGE | Freq: Every day | INTRAMUSCULAR | Status: DC
  Administered 2022-09-12: 40 mg via SUBCUTANEOUS
  Filled 2022-09-12: qty 0.4

## 2022-09-12 NOTE — ED Notes (Signed)
Called to pt's room, pt c/o central chest pressure that started around 6AM and has gotten progressively worse.  Pt denies SHOB, n/v, no diaphoresis note, VSS.  Pt is anxious at this time reports "worried it is my heart".  Pt give SL NTG by prn orders.  Will monitor.

## 2022-09-12 NOTE — Consult Note (Signed)
ANTICOAGULATION CONSULT NOTE  Pharmacy Consult for heparin Indication: chest pain/ACS  Allergies  Allergen Reactions   No Known Allergies Other (See Comments)    Patient Measurements:   Heparin Dosing Weight: 72.4 kg  Vital Signs: Temp: 97.9 F (36.6 C) (04/18 1830) Temp Source: Oral (04/18 1830) BP: 103/55 (04/19 0230) Pulse Rate: 56 (04/19 0230)  Labs: Recent Labs    09/10/22 1108 09/11/22 1450 09/11/22 1826 09/12/22 0427  HGB 11.8* 12.1  --  10.8*  HCT 37.3 37.3  --  33.6*  PLT 276 269  --  252  HEPARINUNFRC  --   --   --  0.74*  CREATININE 0.94 0.93  --   --   TROPONINIHS  --  5 22*  --      Estimated Creatinine Clearance: 61.9 mL/min (by C-G formula based on SCr of 0.93 mg/dL).   Medical History: Past Medical History:  Diagnosis Date   Anxiety    CAD (coronary artery disease)    Diverticulosis    Female genuine stress incontinence 08/15/2014   GERD (gastroesophageal reflux disease)    H. 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   Tachycardia 12/20/2015   Takotsubo cardiomyopathy     Medications:  PTA: aspirin  PO daily Inpatient: Heparin infusion 4/18 >>> Allergies: NKDA  Assessment: 67 year old female with history of HLD, HTN, CAD, and prior MI (in 32) presents to ED with complaint of left arm pain, states felt similar with past MI. Troponin increased from 5 ng/L to 22 ng/L. Pharmacy consulted for management of heparin infusion in the setting of NSTEMI.  Goal of Therapy:  Heparin level 0.3-0.7  units/ml Monitor platelets by anticoagulation protocol: Yes   0419 0427 HL 0.74, supratherapeutic  Plan:  Decrease heparin infusion rate to 750 units/hr Recheck HL in 6 hrs after rate change Continue to monitor H&H and platelets daily while on heparin gtt.  Otelia Sergeant, PharmD, [facility] 09/12/2022 5:39 AM

## 2022-09-12 NOTE — Consult Note (Signed)
Cardiology Consultation   Patient ID: Jenny Lee MRN: [id number]; DOB: May 06, 1956  Admit date: 09/11/2022 Date of Consult: 09/12/2022  PCP:  Sherrie Mustache, MD   [facility] Providers Reason for consult: Angina Physician requesting consult: Dr. Lucianne Muss    Attending Note Patient seen and examined, agree with detailed note above,   Patient presentation and plan discussed on rounds.   Cardiology consult placed by Dr. Kateri Plummer Reason for consult: Chest pain/angina  EKG lab work, chest x-ray, echocardiogram reviewed independently by myself  Jenny Lee is a 67 y.o. female with a hx of CAD, MI in 2002, HTN, HLD, HTN, OSA on CPAP, Takotsubo cardiomyopathy and SCAD who is being seen 09/12/2022 for the evaluation of chest pain   Full cardiac history as detailed below Seen in 2002 for chest pain, cardiac workup at that time consistent with stress cardiomyopathy, cardiac catheterization performed at [facility] Recurrent chest pain 2017, left heart catheterization performed with concern for SCAD and stress cardiomyopathy Ejection fraction at that time 35% on catheterization Per the notes ejection fraction has normalized on medical management Followed recently by cardiology at [facility] for history of stress cardiomyopathy and paroxysmal tachycardia for paroxysmal SVT started on sotalol Outpatient Zio monitor performed but results not available Currently takes diltiazem ER 120 daily, metoprolol succinate 50 daily, sotalol 80 daily  For chronic chest pain symptoms takes isosorbide 30 daily, Ranexa 500 twice daily  Yesterday was being evaluated by GI, while in the office developed tachycardia.  Heart rate measured 140 bpm, no EKG performed.  Was brought to the emergency room Cardiac enzymes negative, chest x-ray unremarkable Was started on heparin infusion Also reported having left arm pain concerning for prior anginal symptoms  Reports chest pain overnight requiring nitro x 2  On  examination : alert oriented, no JVD, lungs clear to auscultation bilaterally, heart sounds regular normal S1-S2 no murmurs appreciated, abdomen soft nontender no significant lower extremity edema.  Musculoskeletal exam with good range of motion, neurologic exam grossly nonfocal  Lab work reviewed    Latest Ref Rng & Units 09/11/2022    2:50 PM 09/10/2022   11:08 AM 02/06/2022   12:35 AM  BMP  Glucose 70 - 99 mg/dL 119  89  147   BUN 8 - 23 mg/dL 19  22  15    Creatinine 0.44 - 1.00 mg/dL 8.29  5.62  1.30   Sodium 135 - 145 mmol/L 132  135  133   Potassium 3.5 - 5.1 mmol/L 3.8  4.7  3.5   Chloride 98 - 111 mmol/L 98  101  96   CO2 22 - 32 mmol/L 22  27  25    Calcium 8.9 - 10.3 mg/dL 9.1  9.1  9.3       Latest Ref Rng & Units 09/12/2022    4:27 AM 09/11/2022    2:50 PM 09/10/2022   11:08 AM  CBC  WBC 4.0 - 10.5 K/uL 7.8  8.4  6.8   Hemoglobin 12.0 - 15.0 g/dL 86.5  78.4  69.6   Hematocrit 36.0 - 46.0 % 33.6  37.3  37.3   Platelets 150 - 400 K/uL 252  269  276    Troponin 5, 22, 12  EKG personally reviewed by myself showing normal sinus rhythm no significant ST-T wave changes rate 72 bpm  Echocardiogram reviewed showing normal LV function EF greater than 55%, no pericardial effusion, no pleural effusion, normal RV size and function  A/P: Atypical chest pain  Prior history of stress cardiomyopathy 2002 and again 2017 Cardiac enzymes nontrending this admission, echocardiogram unchanged with normal LV function Can discontinue heparin We have recommended we hold enalapril, If blood pressure will permit increase isosorbide up to 30 mg twice daily No other changes to medications If chest pain persists could increase Ranexa up to 1000 twice daily -Consider outpatient cardiac CTA No indication for cardiac catheterization at this time  Paroxysmal tachycardia Continue diltiazem ER 120 daily, metoprolol succinate 50 daily Was placed on sotalol 80 mg daily by [facility] cardiology, will continue  for now Could consider repeat Zio monitor (last monitor done through Dr. Welton Flakes) Unable to increase dosing above given baseline bradycardia rates in the low 50s  Essential hypertension Continue outpatient medications, hold enalapril, increase isosorbide up to 30 twice daily  Anxiety Per internal medicine    Greater than 50% was spent in counseling and coordination of care with patient Total encounter time 80 minutes or more   Signed: Dossie Arbour  M.D., Ph.D. [facility]   Patient Profile:   Jenny Lee is a 67 y.o. female with a hx of CAD, MI in 2002, HTN, HLD, HTN, OSA on CPAP, Takotsubo cardiomyopathy and SCAD who is being seen 09/12/2022 for the evaluation of chest pain at the request of Dr. Lucianne Muss.  History of Present Illness:   Jenny Lee is a 67 y.o. female with a hx of CAD, MI in 2002, HTN, HLD, HTN, OSA on CPAP, Takotsubo cardiomyopathy who is being seen for the evaluation of chest pain. She reported having an MI in 2002 that took place at [facility]; difficulty locating the records. She underwent a LHC in 2017 with Dr. Welton Flakes that was negative for CAD. Dr. Welton Flakes suspected etiology of Takotsubo cardiomyopathy and SCAD. She then underwent VAS Renal US that ws negative for FMD. In 07/2020, the patient went to [facility] ED for chest pain. Troponin was negative and EKG was unremarkable. She was discharged to follow up with her cardiologist to perform a stress test. There is no documentation of the stress report. In 01/2022, patient went to [facility] ED for chest pain. EKG showed NSR with negative HS troponin. In 02/2022, she followed up with her cardiologist, Dr. Kathi Ludwig at [facility], for cardiomyopathy and palpitations. She was diagnosed with paroxysmal SVT and started on sotalol. She was also given a Zio monitor that we do not have the reports from. MRIA was unremarkable. Her last visit with Dr. Kathi Ludwig was from 05/2022 and she was started on Cardizem 120 mg and ASA 81 mg.   The patient reports having episode  of fast heart beats for the past few weeks. Yesterday 4/18, the patient was with her GI specialist at [facility] when she started to get "strong" palpitations and left arm pain that "felt the same as my MI." She became anxious and was brought down to the [facility] ED. She reported chest pain, palpitations, but denied shortness of breath.   Upon presentation to the [facility] ED on 4/18, the patient was having left arm pain that was similar to the feeling of her previous MI. She admitted to nausea and dizziness. Her vitals were 149/67 HR 73 RR 16 afebrile and SpO2 of 99% on RA. Labs showed Na 132, K 2.8, BG 158, BUN 19, Cr 0.93, WBC 8.4, Hgb 11.8. HS Troponin were 5 and 22. CXR was unremarkable. EKG showed NSR with a rate of 72 bpm and nonspecific T wave abnormalities. BNP was 80.5. Patient was started on IV Heparin and  admitted for further workup.   Past Medical History:  Diagnosis Date   Anxiety    CAD (coronary artery disease)    Diverticulosis    Female genuine stress incontinence 08/15/2014   GERD (gastroesophageal reflux disease)    H. 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   Tachycardia 12/20/2015   Takotsubo cardiomyopathy     Past Surgical History:  Procedure Laterality Date   ABDOMINAL HYSTERECTOMY  05/26/2002   CARDIAC CATHETERIZATION N/A 11/26/2015   Procedure: Left Heart Cath and Coronary Angiography;  Surgeon: Laurier Nancy, MD;  Location: [facility] INVASIVE CV LAB;  Service: Cardiovascular;  Laterality: N/A;   CERVICAL SPINE SURGERY  05/26/1996   fusion    CLAVICLE SURGERY Right    shaved bone   COLONOSCOPY WITH PROPOFOL N/A 12/23/2016   Procedure: COLONOSCOPY WITH PROPOFOL;  Surgeon: [name], MD;  Location: [facility] ENDOSCOPY;  Service: Endoscopy;  Laterality: N/A;   COLONOSCOPY WITH PROPOFOL N/A 06/26/2017   Procedure: COLONOSCOPY WITH PROPOFOL;  Surgeon: Pasty Spillers, MD;  Location: [facility] ENDOSCOPY;  Service: Gastroenterology;  Laterality: N/A;   CORONARY BALLOON ANGIOPLASTY  1998   ESOPHAGOGASTRODUODENOSCOPY (EGD) WITH PROPOFOL N/A 12/23/2016   Procedure: ESOPHAGOGASTRODUODENOSCOPY (EGD) WITH PROPOFOL;  Surgeon: [name], MD;  Location: [facility] ENDOSCOPY;  Service: Endoscopy;  Laterality: N/A;   ESOPHAGOGASTRODUODENOSCOPY (EGD) WITH PROPOFOL N/A 03/31/2017   Procedure: ESOPHAGOGASTRODUODENOSCOPY (EGD) WITH PROPOFOL WITH GASTRIC MAPPING;  Surgeon: [name], MD;  Location: [facility] ENDOSCOPY;  Service: Gastroenterology;  Laterality: N/A;   ESOPHAGOGASTRODUODENOSCOPY (EGD) WITH PROPOFOL N/A 12/21/2017   Procedure: ESOPHAGOGASTRODUODENOSCOPY (EGD) WITH PROPOFOL;  Surgeon: [name], MD;  Location: [facility] ENDOSCOPY;  Service: Gastroenterology;  Laterality: N/A;   RENAL ANGIOGRAPHY Bilateral 03/02/2017   Procedure: RENAL ANGIOGRAPHY;  Surgeon: Annice Needy, MD;  Location: [facility] INVASIVE CV LAB;  Service: Cardiovascular;  Laterality: Bilateral;   VAGINAL PROLAPSE REPAIR       Home Medications:  Prior to Admission medications   Medication Sig Start Date End Date Taking? Authorizing Provider  aspirin 81 MG tablet Take 81 mg by mouth daily.   Yes [provider]  celecoxib (CELEBREX) 100 MG capsule Take 100 mg by mouth 2 (two) times daily. 06/25/21  Yes [provider]  cetirizine (ZYRTEC) 10 MG tablet Take 10 mg by mouth daily.   Yes [provider]  clonazePAM (KLONOPIN) 0.5 MG tablet Take 1 tablet (0.5 mg total) by mouth 3 (three) times daily as needed for up to 5 days for anxiety. 02/06/22 09/11/22 Yes Irean Hong,  MD  diltiazem (CARDIZEM CD) 120 MG 24 hr capsule Take 120 mg by mouth daily. 06/02/22  Yes [provider]  enalapril (VASOTEC) 20 MG tablet Take 20 mg by mouth daily.   Yes [provider]  estradiol (ESTRACE) 0.5 MG tablet Take 0.5 mg by mouth daily.   Yes [provider]  furosemide (LASIX) 20 MG tablet Take 10 mg by mouth daily as needed. 01/31/22  Yes [provider]  gabapentin (NEURONTIN)  300 MG capsule Take 900 mg by mouth at bedtime. 03/25/18  Yes [provider]  isosorbide mononitrate (IMDUR) 30 MG 24 hr tablet Take 30 mg by mouth daily. 01/31/21  Yes [provider]  Melatonin 10 MG TABS Take 10 mg by mouth at bedtime.    Yes [provider]  metoprolol succinate (TOPROL-XL) 50 MG 24 hr tablet Take 50 mg by mouth daily. 06/02/22  Yes [provider]  ondansetron (ZOFRAN) 4 MG tablet Take 4 mg by mouth every 8 (eight) hours as needed for nausea.  12/01/16  Yes [provider]  pantoprazole (PROTONIX) 40 MG tablet 40 mg daily. 08/28/17  Yes [provider]  ranolazine (RANEXA) 500 MG 12 hr tablet Take 500 mg by mouth 2 (two) times daily. 08/18/22  Yes [provider]  rosuvastatin (CRESTOR) 40 MG tablet Take 1 tablet by mouth daily. 01/18/19  Yes [provider]  sotalol (BETAPACE) 80 MG tablet Take 1 tablet by mouth daily. 01/28/22  Yes [provider]  tiZANidine (ZANAFLEX) 2 MG tablet Take 2 mg by mouth every 8 (eight) hours as needed. 08/19/22  Yes [provider]  Vitamin D, Ergocalciferol, (DRISDOL) 50000 units CAPS capsule Take 50,000 Units by mouth every 7 (seven) days. Saturday 11/06/17   [provider]    Inpatient Medications: Scheduled Meds:  aspirin  81 mg Oral Daily   diltiazem  120 mg Oral Daily   enalapril  20 mg Oral Daily   gabapentin  900 mg Oral QHS   isosorbide mononitrate  30 mg Oral Daily   loratadine  10 mg Oral Daily   melatonin  10 mg Oral  QHS   metoprolol succinate  50 mg Oral Daily   pantoprazole  40 mg Oral Daily   ranolazine  500 mg Oral BID   rosuvastatin  40 mg Oral Daily   sotalol  80 mg Oral Daily   [START ON 09/13/2022] Vitamin D (Ergocalciferol)  50,000 Units Oral Q7 days   Continuous Infusions:  sodium chloride     heparin 750 Units/hr (09/12/22 0541)   PRN Meds: acetaminophen, clonazePAM, furosemide, magnesium hydroxide, morphine injection, nitroGLYCERIN, ondansetron (ZOFRAN) IV, ondansetron, tiZANidine, traZODone  Allergies:    Allergies  Allergen Reactions   No Known Allergies Other (See Comments)    Social History:   Social History   Socioeconomic History   Marital status: Married    Spouse name: Masoud   Number of children: 4   Years of education: Not on file   Highest education level: Not on file  Occupational History   Not on file  Tobacco Use   Smoking status: Former    Packs/day: 0.00    Years: 0.00    Additional pack years: 0.00    Total pack years: 0.00    Types: Cigarettes   Smokeless tobacco: Never  Vaping Use   Vaping Use: Never used  Substance and Sexual Activity   Alcohol use: Yes    Alcohol/week: 0.0 standard drinks of alcohol    Comment: occasional wine   Drug use: No   Sexual activity: Yes  Other Topics Concern   Not on file  Social History Narrative   Not on file   Social Determinants of Health   Financial Resource Strain: Not on file  Food Insecurity: Not on file  Transportation Needs: Not on file  Physical Activity: Not on file  Stress: Not on file  Social Connections: Not on file  Intimate Partner Violence:  Not on file    Family History:    Family History  Problem Relation Age of Onset   Hypertension Mother    Breast cancer Sister 68     ROS:  Please see the history of present illness.   All other ROS reviewed and negative.     Physical Exam/Data:   Vitals:   09/12/22 0230 09/12/22 0600 09/12/22 0607 09/12/22 0851  BP: (!) 103/55 113/62   137/76  Pulse: (!) 56 (!) 55  86  Resp: 13 11  15   Temp:   98.8 F (37.1 C)   TempSrc:   Axillary   SpO2: 94% 97%  98%   No intake or output data in the 24 hours ending 09/12/22 0945    09/11/2022    1:41 PM 09/09/2022    2:49 PM 09/03/2022    4:00 PM  Last 3 Weights  Weight (lbs) 165 lb 9.6 oz 165 lb 166 lb  Weight (kg) 75.116 kg 74.844 kg 75.297 kg     There is no height or weight on file to calculate BMI.  General:  Well nourished, well developed, in no acute distress HEENT: normal Neck: no JVD Vascular: No carotid bruits; Distal pulses 2+ bilaterally Cardiac:  normal S1, S2; RRR; no murmur Lungs:  clear to auscultation bilaterally, no wheezing, rhonchi or rales  Abd: soft, nontender, no hepatomegaly  Ext: no edema Musculoskeletal:  No deformities, BUE and BLE strength normal and equal Skin: warm and dry  Neuro:  CNs 2-12 intact, no focal abnormalities noted Psych:  Normal affect   EKG:  The EKG was personally reviewed and demonstrates:  NSR with a rate of 72 bpm and nonspecific T wave abnormalities  Telemetry:  Telemetry was personally reviewed and demonstrates:  SB to NSR with rate in 50-70s  Relevant CV Studies:  VAS US Renal Artery Bilaterally 08/18/2017 Right: Normal size right kidney.  Left:  Normal size of left kidney.  Mesenteric:  Doppler velocities suggest 1-49% stenosis of the bilateral renal arteries.  Patent right renal artery stent.  The bilateral kidney length measurements are within normal limits.  VAS US Renal Artery Bilaterally 04/07/2017 Doppler velocities suggest 1-49% stenosis noted in the bilateral renal arteries. The bilateral kidney length measurements are within normal limits.   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 Very subtle changes on the left with no significant stenosis of more than 20 or 30%. A large left adrenal  branch with an enlarged left adrenal gland were also identified. Aorta was widely patent without stenosis.   US Renal Artery Duplex 12/26/2016 1. Diffusely increased peak systolic velocities in the aorta and bilateral renal arteries which limits sensitivity for the detection of renal artery stenosis. 2. While there is an isolated measurement of greater than 200 cm/sec in the right mid renal artery, the associated renal artery ratio is only 1.4 due to the elevation in the velocity within the aorta itself. Findings are therefore indeterminate for renal artery stenosis. If clinical concern persists, consider further evaluation with MRA of the abdomen. 3. Normal sonographic appearance of the kidneys.  Left Heart Catheterization 11/26/2015 Patient has normal coronaries but left ventricle ejection fraction is approximately 35% with anteroapical aneurysm. Patient probably has Takosubo cardiomyopathy.   Laboratory Data:  High Sensitivity Troponin:   Recent Labs  Lab 09/11/22 1450 09/11/22 1826  TROPONINIHS 5 22*     Chemistry Recent Labs  Lab 09/10/22  1108 09/11/22 1450  NA 135 132*  K 4.7 3.8  CL 101 98  CO2 27 22  GLUCOSE 89 158*  BUN 22 19  CREATININE 0.94 0.93  CALCIUM 9.1 9.1  GFRNONAA >60 >60  ANIONGAP 7 7    No results for input(s): "PROT", "ALBUMIN", "AST", "ALT", "ALKPHOS", "BILITOT" in the last 168 hours. Lipids No results for input(s): "CHOL", "TRIG", "HDL", "LABVLDL", "LDLCALC", "CHOLHDL" in the last 168 hours.  Hematology Recent Labs  Lab 09/10/22 1108 09/11/22 1450 09/12/22 0427  WBC 6.8 8.4 7.8  RBC 4.53 4.61 4.08  HGB 11.8* 12.1 10.8*  HCT 37.3 37.3 33.6*  MCV 82.3 80.9 82.4  MCH 26.0 26.2 26.5  MCHC 31.6 32.4 32.1  RDW 15.3 15.1 15.1  PLT 276 269 252   Thyroid No results for input(s): "TSH", "FREET4" in the last 168 hours.  BNP Recent Labs  Lab 09/11/22 1922  BNP 80.5    DDimer No results for input(s): "DDIMER" in the last 168  hours.   Radiology/Studies:  DG Chest 2 View  Result Date: 09/11/2022 CLINICAL DATA:  Chest pain EXAM: CHEST - 2 VIEW COMPARISON:  CXR 02/06/22 FINDINGS: No pleural effusion. No pneumothorax. No focal airspace opacity. Normal cardiac and mediastinal contours. No radiographically apparent displaced rib fractures. Visualized upper abdomen is unremarkable. Vertebral body heights are maintained. IMPRESSION: No focal airspace opacity Electronically Signed   By: Lorenza Cambridge M.D.   On: 09/11/2022 15:19     Assessment and Plan:   Hx of Presumed Takotsubo, SCAD - Patient presented with left arm pain that was similar to how she felt with a previous MI. Currently no chest pain after given Nitro x 2.  - LHC from 2017 with [facility] showed Takotsubo and likely SCAD. No evidence of CAD.  - VAS Renal US was negative for FMD  - EKG was NSR  - Negative HS troponin of 5, 22, pending one more. Will discontinue Heparin if troponin remains low  - New echo was ordered  - Discontinue Enalapril to decrease potential etiology of coronary spasms  - Consider increasing Imdur 30 mg and Ranexa 40 mg for angina  - Continue PTA diltiazem 120 mg, metoprolol succinate 50 mg - Cardiac CTA can be ordered for outpatient. She is scheduled for a cholecystectomy on 09/18/2022 with Dr. Aleen Campi. Will try to get CCTA before surgical date to get cardiac clearance.   - Patient stated she is supposed to switch her care to Dr. Kirke Corin and will follow up with outpatient cardiology for further management   Paroxysmal SVT  - Continue Sotalol - Will defer to [facility] cardiology for further management   Essential Hypertension - BP 149/67 on admission  - BP 137/76 this AM - Continue to monitor closely  - Continue PTA diltiazem, metoprolol succinate 50 mg  Anxiety  - Continue Klonopin - Treatment per IM  Normocytic Anemia  - Hgb 10.8, continue to monitor  - Treatment per IM   Risk Assessment/Risk Scores:   TIMI Risk Score for Unstable  Angina or Non-ST Elevation MI:   The patient's TIMI risk score is 3, which indicates a 13% risk of all cause mortality, new or recurrent myocardial infarction or need for urgent revascularization in the next 14 days.{  For questions or updates, please contact [facility] Please consult [email or web address] for contact info under    Signed, Nathanial Millman, Student-PA  09/12/2022 9:45 AM

## 2022-09-12 NOTE — Progress Notes (Signed)
[facility] Progress Note  Patient: Jenny Lee    NGE:[id number]  DOA: 09/11/2022     Date of Service: the patient was seen and examined on 09/12/2022  Chief Complaint  Patient presents with   Arm Pain   Brief hospital course: Jenny Lee is a 67 y.o. African-American female with medical history significant for coronary artery disease status post MI at age of 65 status post PCI and angioplasty, GERD, coronary artery disease, anxiety, diverticulosis, hypertension, dyslipidemia, OSA on CPAP, Takotsubo cardiomyopathy, who presented to the emergency room with acute onset of left wrist pain that felt like squeezing and tightness similar to her previous MRI however at that time it involved all her left arm.  She experienced associated neck tightness as well as nausea, palpitations diaphoresis and dyspnea.  She admitted to mild chills but did not have any measured fever.  She was seeing Dr. Tobi Bastos today for a GI appointment when this happened and was advised to come to the ED via EMS.  No cough or wheezing.  No bleeding diathesis.  No headache or dizziness or blurred vision.   ED Course: When the patient came to the ER, BP was 149/67 with otherwise normal vital signs.  Labs revealed mild hyponatremia of 132 and glucose of 158 with otherwise unremarkable BMP.  BN P was 80.5.  High sensitive troponin I was 5 and later 22.  CBC was within normal. EKG as reviewed by me : Normal sinus rhythm with a rate of 72 with T wave inferiorly. Imaging: Two-view chest x-ray showed no acute cardiopulmonary disease.   The patient was given a bolus infusion of IV heparin as well as 4 mg of IV Zofran.  She will be admitted to an observation cardiac telemetry bed for further evaluation and management.    Assessment and Plan: Atypical angina - Continue telemetry Troponin 10/15/10 remained flat, negative No significant EKG changes S/p heparin IV infusion, discontinued 4/19 patient received 2 nitroglycerin  tablets in the morning due to chest pain, which resolved after nitroglycerin. Continue nitroglycerin as needed Continue aspirin, sotalol, Crestor Home medications Patient was seen by cardiology, recommended Toprol-XL 50 mg p.o. daily, Cardizem CD 120 mg p.o. daily, started Imdur 30 mg p.o. daily and increased Ranexa from [phone] twice daily TTE LVEF 60 to 65%, grade 1 diastolic dysfunction, no wall motion abnormality.  No any other significant abnormalities. As per cardiology we can monitor today, if no chest pain then patient can be discharged home tomorrow a.m.   Essential hypertension - We will continue antihypertensives.   Dyslipidemia - We will continue statin therapy.   Anxiety - We will continue as needed Klonopin     Diet: Heart healthy diet DVT Prophylaxis: Subcutaneous Lovenox   Advance goals of care discussion: Full code  Family Communication: family was not present at bedside, at the time of interview.  The pt provided permission to discuss medical plan with the family. Opportunity was given to ask question and all questions were answered satisfactorily.   Disposition:  Pt is from Home, admitted with chest pain, still has symptoms, which precludes a safe discharge. Discharge to home, when clinically stable, most likely tomorrow a.m.  Subjective: No significant overnight events, early morning patient had chest pain which resolved after 2 tablets of nitroglycerin sublingual given.  Currently patient is chest pain-free.  Medication has been adjusted as above by cardiology.  We will continue to monitor today and plan for disposition tomorrow a.m. if remains stable.  Physical  Exam: General: NAD, lying comfortably Appear in no distress, affect appropriate Eyes: PERRLA ENT: Oral Mucosa Clear, moist  Neck: no JVD,  Cardiovascular: S1 and S2 Present, no Murmur,  Respiratory: good respiratory effort, Bilateral Air entry equal and Decreased, no Crackles, no wheezes Abdomen:  Bowel Sound present, Soft and no tenderness,  Skin: no rashes Extremities: no Pedal edema, no calf tenderness Neurologic: without any new focal findings Gait not checked due to patient safety concerns  Vitals:   09/12/22 0607 09/12/22 0851 09/12/22 1030 09/12/22 1100  BP:  137/76 111/64 (!) 96/53  Pulse:  86 (!) 52 (!) 50  Resp:  15 17 12   Temp: 98.8 F (37.1 C)     TempSrc: Axillary     SpO2:  98% 95% 95%   No intake or output data in the 24 hours ending 09/12/22 1440 There were no vitals filed for this visit.  Data Reviewed: I have personally reviewed and interpreted daily labs, tele strips, imagings as discussed above. I reviewed all nursing notes, pharmacy notes, vitals, pertinent old records I have discussed plan of care as described above with RN and patient/family.  CBC: Recent Labs  Lab 09/10/22 1108 09/11/22 1450 09/12/22 0427  WBC 6.8 8.4 7.8  HGB 11.8* 12.1 10.8*  HCT 37.3 37.3 33.6*  MCV 82.3 80.9 82.4  PLT 276 269 252   Lee Metabolic Panel: Recent Labs  Lab 09/10/22 1108 09/11/22 1450  NA 135 132*  K 4.7 3.8  CL 101 98  CO2 27 22  GLUCOSE 89 158*  BUN 22 19  CREATININE 0.94 0.93  CALCIUM 9.1 9.1    Studies: ECHOCARDIOGRAM COMPLETE  Result Date: 09/12/2022    ECHOCARDIOGRAM REPORT   Patient Name:   Jenny Lee Date of Exam: 09/12/2022 Medical Rec #:  [id number]        Height:       65.0 in Accession #:    [id number]       Weight:       165.6 lb Date of Birth:  Dec 06, 1955        BSA:          1.826 m Patient Age:    66 years         BP:           96/53 mmHg Patient Gender: F                HR:           54 bpm. Exam Location:  [facility] Procedure: 2D Echo, Cardiac Doppler and Color Doppler Indications:     Chest Pain  History:         Patient has no prior history of Echocardiogram examinations.                  Acute MI, Arrythmias:Tachycardia, Signs/Symptoms:Chest Pain and                  Syncope; Risk Factors:Hypertension and Dyslipidemia. Takotsubo                   cardiomyopathy.  Sonographer:     Mikki Harbor Referring Phys:  [id number] Antonieta Iba Diagnosing Phys: Julien Nordmann MD IMPRESSIONS  1. Left ventricular ejection fraction, by estimation, is 60 to 65%. The left ventricle has normal function. The left ventricle has no regional wall motion abnormalities. Left ventricular diastolic parameters are consistent with Grade I diastolic dysfunction (impaired relaxation).  2. Right ventricular systolic function  is normal. The right ventricular size is normal. There is normal pulmonary artery systolic pressure.  3. The mitral valve is normal in structure. Mild mitral valve regurgitation. No evidence of mitral stenosis.  4. The aortic valve is normal in structure. Aortic valve regurgitation is mild. Aortic valve sclerosis/calcification is present, without any evidence of aortic stenosis.  5. The inferior vena cava is normal in size with greater than 50% respiratory variability, suggesting right atrial pressure of 3 mmHg. FINDINGS  Left Ventricle: Left ventricular ejection fraction, by estimation, is 60 to 65%. The left ventricle has normal function. The left ventricle has no regional wall motion abnormalities. The left ventricular internal cavity size was normal in size. There is  no left ventricular hypertrophy. Left ventricular diastolic parameters are consistent with Grade I diastolic dysfunction (impaired relaxation). Right Ventricle: The right ventricular size is normal. No increase in right ventricular wall thickness. Right ventricular systolic function is normal. There is normal pulmonary artery systolic pressure. The tricuspid regurgitant velocity is 2.06 m/s, and  with an assumed right atrial pressure of 3 mmHg, the estimated right ventricular systolic pressure is 20.0 mmHg. Left Atrium: Left atrial size was normal in size. Right Atrium: Right atrial size was normal in size. Pericardium: There is no evidence of pericardial effusion. Mitral Valve:  The mitral valve is normal in structure. Mild mitral valve regurgitation. No evidence of mitral valve stenosis. MV peak gradient, 2.0 mmHg. The mean mitral valve gradient is 1.0 mmHg. Tricuspid Valve: The tricuspid valve is normal in structure. Tricuspid valve regurgitation is mild . No evidence of tricuspid stenosis. Aortic Valve: The aortic valve is normal in structure. Aortic valve regurgitation is mild. Aortic regurgitation PHT measures 667 msec. Aortic valve sclerosis/calcification is present, without any evidence of aortic stenosis. Aortic valve mean gradient measures 6.0 mmHg. Aortic valve peak gradient measures 12.2 mmHg. Aortic valve area, by VTI measures 1.67 cm. Pulmonic Valve: The pulmonic valve was normal in structure. Pulmonic valve regurgitation is not visualized. No evidence of pulmonic stenosis. Aorta: The aortic root is normal in size and structure. Venous: The inferior vena cava is normal in size with greater than 50% respiratory variability, suggesting right atrial pressure of 3 mmHg. IAS/Shunts: No atrial level shunt detected by color flow Doppler.  LEFT VENTRICLE PLAX 2D LVIDd:         4.20 cm   Diastology LVIDs:         2.70 cm   LV e' medial:    9.46 cm/s LV PW:         1.30 cm   LV E/e' medial:  6.8 LV IVS:        1.00 cm   LV e' lateral:   8.59 cm/s LVOT diam:     1.90 cm   LV E/e' lateral: 7.5 LV SV:         70 LV SV Index:   39 LVOT Area:     2.84 cm  RIGHT VENTRICLE RV Basal diam:  2.95 cm RV Mid diam:    2.80 cm RV S prime:     12.50 cm/s TAPSE (M-mode): 2.4 cm LEFT ATRIUM             Index        RIGHT ATRIUM           Index LA diam:        3.00 cm 1.64 cm/m   RA Area:     17.50 cm LA Vol (A2C):  72.8 ml 39.88 ml/m  RA Volume:   48.00 ml  26.29 ml/m LA Vol (A4C):   29.9 ml 16.38 ml/m LA Biplane Vol: 46.8 ml 25.63 ml/m  AORTIC VALVE                     PULMONIC VALVE AV Area (Vmax):    1.77 cm      PV Vmax:       0.96 m/s AV Area (Vmean):   1.57 cm      PV Peak grad:  3.7  mmHg AV Area (VTI):     1.67 cm AV Vmax:           175.00 cm/s AV Vmean:          121.000 cm/s AV VTI:            0.420 m AV Peak Grad:      12.2 mmHg AV Mean Grad:      6.0 mmHg LVOT Vmax:         109.00 cm/s LVOT Vmean:        66.900 cm/s LVOT VTI:          0.248 m LVOT/AV VTI ratio: 0.59 AI PHT:            667 msec  AORTA Ao Root diam: 3.20 cm Ao Asc diam:  3.10 cm MITRAL VALVE               TRICUSPID VALVE MV Area (PHT): 3.10 cm    TR Peak grad:   17.0 mmHg MV Area VTI:   2.07 cm    TR Vmax:        206.00 cm/s MV Peak grad:  2.0 mmHg MV Mean grad:  1.0 mmHg    SHUNTS MV Vmax:       0.71 m/s    Systemic VTI:  0.25 m MV Vmean:      41.8 cm/s   Systemic Diam: 1.90 cm MV Decel Time: 245 msec MV E velocity: 64.50 cm/s MV A velocity: 78.60 cm/s MV E/A ratio:  0.82 Julien Nordmann MD Electronically signed by Julien Nordmann MD Signature Date/Time: 09/12/2022/1:56:59 PM    Final    DG Chest 2 View  Result Date: 09/11/2022 CLINICAL DATA:  Chest pain EXAM: CHEST - 2 VIEW COMPARISON:  CXR 02/06/22 FINDINGS: No pleural effusion. No pneumothorax. No focal airspace opacity. Normal cardiac and mediastinal contours. No radiographically apparent displaced rib fractures. Visualized upper abdomen is unremarkable. Vertebral body heights are maintained. IMPRESSION: No focal airspace opacity Electronically Signed   By: Lorenza Cambridge M.D.   On: 09/11/2022 15:19    Scheduled Meds:  aspirin  81 mg Oral Daily   diltiazem  120 mg Oral Daily   gabapentin  900 mg Oral QHS   isosorbide mononitrate  30 mg Oral Daily   loratadine  10 mg Oral Daily   melatonin  10 mg Oral QHS   metoprolol succinate  50 mg Oral Daily   pantoprazole  40 mg Oral Daily   ranolazine  500 mg Oral BID   rosuvastatin  40 mg Oral Daily   sotalol  80 mg Oral Daily   [START ON 09/13/2022] Vitamin D (Ergocalciferol)  50,000 Units Oral Q7 days   Continuous Infusions:  sodium chloride     PRN Meds: acetaminophen, clonazePAM, furosemide, magnesium  hydroxide, morphine injection, nitroGLYCERIN, ondansetron (ZOFRAN) IV, ondansetron, tiZANidine, traZODone  Time spent: 35 minutes  Author: Gillis Santa. MD [facility] 09/12/2022  2:40 PM  To reach On-call, see care teams to locate the attending and reach out to them via [email or web address]. If 7PM-7AM, please contact night-coverage If you still have difficulty reaching the attending provider, please page the [facility] (Director on Call) for [facility] on amion for assistance.

## 2022-09-12 NOTE — Progress Notes (Signed)
*  PRELIMINARY RESULTS* Echocardiogram 2D Echocardiogram has been performed.  Carolyne Fiscal 09/12/2022, 1:49 PM

## 2022-09-13 DIAGNOSIS — I2089 Other forms of angina pectoris: Secondary | ICD-10-CM | POA: Diagnosis not present

## 2022-09-13 DIAGNOSIS — I25110 Atherosclerotic heart disease of native coronary artery with unstable angina pectoris: Principal | ICD-10-CM

## 2022-09-13 DIAGNOSIS — R072 Precordial pain: Secondary | ICD-10-CM | POA: Diagnosis not present

## 2022-09-13 LAB — BASIC METABOLIC PANEL
Anion gap: 10 (ref 5–15)
BUN: 22 mg/dL (ref 8–23)
CO2: 23 mmol/L (ref 22–32)
Calcium: 9 mg/dL (ref 8.9–10.3)
Chloride: 104 mmol/L (ref 98–111)
Creatinine, Ser: 0.98 mg/dL (ref 0.44–1.00)
GFR, Estimated: >60 mL/min (ref >60)
Glucose, Bld: 97 mg/dL (ref 70–99)
Potassium: 4 mmol/L (ref 3.5–5.1)
Sodium: 137 mmol/L (ref 135–145)

## 2022-09-13 LAB — CBC
HCT: 36.6 % (ref 36.0–46.0)
Hemoglobin: 11.5 g/dL — ABNORMAL LOW (ref 12.0–15.0)
MCH: 25.7 pg — ABNORMAL LOW (ref 26.0–34.0)
MCHC: 31.4 g/dL (ref 30.0–36.0)
MCV: 81.7 fL (ref 80.0–100.0)
Platelets: 267 10*3/uL (ref 150–400)
RBC: 4.48 MIL/uL (ref 3.87–5.11)
RDW: 15.2 % (ref 11.5–15.5)
WBC: 7.8 10*3/uL (ref 4.0–10.5)
nRBC: 0 % (ref 0.0–0.2)

## 2022-09-13 LAB — HIV ANTIBODY (ROUTINE TESTING W REFLEX): HIV Screen 4th Generation wRfx: NONREACTIVE

## 2022-09-13 LAB — PHOSPHORUS: Phosphorus: 4.2 mg/dL (ref 2.5–4.6)

## 2022-09-13 LAB — MAGNESIUM: Magnesium: 2.3 mg/dL (ref 1.7–2.4)

## 2022-09-13 MED ORDER — RANOLAZINE ER 1000 MG PO TB12: 1000.0000 mg | ORAL_TABLET | Freq: Two times a day (BID) | ORAL | 0 refills | Status: DC

## 2022-09-13 MED ORDER — CLONAZEPAM 0.5 MG PO TABS: 0.5000 mg | ORAL_TABLET | Freq: Two times a day (BID) | ORAL | 0 refills | Status: DC

## 2022-09-13 NOTE — Progress Notes (Signed)
Rounding Note    Patient Name: Jenny Lee Date of Encounter: 09/13/2022  [facility] Cardiologist: [facility]  Subjective   Denies chest pain or shortness of breath, ambulated with no issues to the bathroom.  Husband at bedside.  Inpatient Medications    Scheduled Meds:  aspirin  81 mg Oral Daily   diltiazem  120 mg Oral Daily   enoxaparin (LOVENOX) injection  40 mg Subcutaneous Daily   gabapentin  900 mg Oral QHS   isosorbide mononitrate  30 mg Oral Daily   loratadine  10 mg Oral Daily   melatonin  10 mg Oral QHS   metoprolol succinate  50 mg Oral Daily   pantoprazole  40 mg Oral Daily   ranolazine  1,000 mg Oral BID   rosuvastatin  40 mg Oral Daily   sotalol  80 mg Oral Daily   Vitamin D (Ergocalciferol)  50,000 Units Oral Q7 days   Continuous Infusions:  sodium chloride     PRN Meds: acetaminophen, clonazePAM, furosemide, magnesium hydroxide, morphine injection, nitroGLYCERIN, ondansetron (ZOFRAN) IV, ondansetron, tiZANidine, traZODone   Vital Signs    Vitals:   09/13/22 0035 09/13/22 0314 09/13/22 0744 09/13/22 1131  BP: (!) 103/51 (!) 101/56 125/64 (!) 120/56  Pulse: (!) 51 (!) 51 (!) 56 (!) 59  Resp: 16 16 18 20   Temp: (!) 97.5 F (36.4 C) (!) 97.5 F (36.4 C) (!) 97.5 F (36.4 C) 97.8 F (36.6 C)  TempSrc:      SpO2: 97% 97% 98% 98%    Intake/Output Summary (Last 24 hours) at 09/13/2022 1450 Last data filed at 09/12/2022 1952 Gross per 24 hour  Intake 240 ml  Output --  Net 240 ml      09/11/2022    1:41 PM 09/09/2022    2:49 PM 09/03/2022    4:00 PM  Last 3 Weights  Weight (lbs) 165 lb 9.6 oz 165 lb 166 lb  Weight (kg) 75.116 kg 74.844 kg 75.297 kg      Telemetry    Sinus rhythm/sinus bradycardia heart rate 58- Personally Reviewed  ECG     - Personally Reviewed  Physical Exam   GEN: No acute distress.   Neck: No JVD Cardiac: RRR, no murmurs, rubs, or gallops.  Respiratory: Clear to auscultation  bilaterally. GI: Soft, nontender, non-distended  MS: No edema; No deformity. Neuro:  Nonfocal  Psych: Normal affect   Labs    High Sensitivity Troponin:   Recent Labs  Lab 09/11/22 1450 09/11/22 1826 09/12/22 1203  TROPONINIHS 5 22* 12     Chemistry Recent Labs  Lab 09/10/22 1108 09/11/22 1450 09/13/22 0435  NA 135 132* 137  K 4.7 3.8 4.0  CL 101 98 104  CO2 27 22 23   GLUCOSE 89 158* 97  BUN 22 19 22   CREATININE 0.94 0.93 0.98  CALCIUM 9.1 9.1 9.0  MG  --   --  2.3  GFRNONAA >60 >60 >60  ANIONGAP 7 7 10     Lipids  Recent Labs  Lab 09/12/22 1203  CHOL 155  TRIG 52  HDL 54  LDLCALC 91  CHOLHDL 2.9    Hematology Recent Labs  Lab 09/11/22 1450 09/12/22 0427 09/13/22 0435  WBC 8.4 7.8 7.8  RBC 4.61 4.08 4.48  HGB 12.1 10.8* 11.5*  HCT 37.3 33.6* 36.6  MCV 80.9 82.4 81.7  MCH 26.2 26.5 25.7*  MCHC 32.4 32.1 31.4  RDW 15.1 15.1 15.2  PLT 269 252 267  Thyroid No results for input(s): "TSH", "FREET4" in the last 168 hours.  BNP Recent Labs  Lab 09/11/22 1922  BNP 80.5    DDimer No results for input(s): "DDIMER" in the last 168 hours.   Radiology    ECHOCARDIOGRAM COMPLETE  Result Date: 09/12/2022    ECHOCARDIOGRAM REPORT   Patient Name:   Jenny Lee Szumski Date of Exam: 09/12/2022 Medical Rec #:  [id number]        Height:       65.0 in Accession #:    [id number]       Weight:       165.6 lb Date of Birth:  05-May-1956        BSA:          1.826 m Patient Age:    66 years         BP:           96/53 mmHg Patient Gender: F                HR:           54 bpm. Exam Location:  [facility] Procedure: 2D Echo, Cardiac Doppler and Color Doppler Indications:     Chest Pain  History:         Patient has no prior history of Echocardiogram examinations.                  Acute MI, Arrythmias:Tachycardia, Signs/Symptoms:Chest Pain and                  Syncope; Risk Factors:Hypertension and Dyslipidemia. Takotsubo                  cardiomyopathy.  Sonographer:     Mikki Harbor Referring Phys:  [id number] Antonieta Iba Diagnosing Phys: Julien Nordmann MD IMPRESSIONS  1. Left ventricular ejection fraction, by estimation, is 60 to 65%. The left ventricle has normal function. The left ventricle has no regional wall motion abnormalities. Left ventricular diastolic parameters are consistent with Grade I diastolic dysfunction (impaired relaxation).  2. Right ventricular systolic function is normal. The right ventricular size is normal. There is normal pulmonary artery systolic pressure.  3. The mitral valve is normal in structure. Mild mitral valve regurgitation. No evidence of mitral stenosis.  4. The aortic valve is normal in structure. Aortic valve regurgitation is mild. Aortic valve sclerosis/calcification is present, without any evidence of aortic stenosis.  5. The inferior vena cava is normal in size with greater than 50% respiratory variability, suggesting right atrial pressure of 3 mmHg. FINDINGS  Left Ventricle: Left ventricular ejection fraction, by estimation, is 60 to 65%. The left ventricle has normal function. The left ventricle has no regional wall motion abnormalities. The left ventricular internal cavity size was normal in size. There is  no left ventricular hypertrophy. Left ventricular diastolic parameters are consistent with Grade I diastolic dysfunction (impaired relaxation). Right Ventricle: The right ventricular size is normal. No increase in right ventricular wall thickness. Right ventricular systolic function is normal. There is normal pulmonary artery systolic pressure. The tricuspid regurgitant velocity is 2.06 m/s, and  with an assumed right atrial pressure of 3 mmHg, the estimated right ventricular systolic pressure is 20.0 mmHg. Left Atrium: Left atrial size was normal in size. Right Atrium: Right atrial size was normal in size. Pericardium: There is no evidence of pericardial effusion. Mitral Valve: The mitral valve is normal in structure. Mild mitral valve  regurgitation. No evidence of mitral  valve stenosis. MV peak gradient, 2.0 mmHg. The mean mitral valve gradient is 1.0 mmHg. Tricuspid Valve: The tricuspid valve is normal in structure. Tricuspid valve regurgitation is mild . No evidence of tricuspid stenosis. Aortic Valve: The aortic valve is normal in structure. Aortic valve regurgitation is mild. Aortic regurgitation PHT measures 667 msec. Aortic valve sclerosis/calcification is present, without any evidence of aortic stenosis. Aortic valve mean gradient measures 6.0 mmHg. Aortic valve peak gradient measures 12.2 mmHg. Aortic valve area, by VTI measures 1.67 cm. Pulmonic Valve: The pulmonic valve was normal in structure. Pulmonic valve regurgitation is not visualized. No evidence of pulmonic stenosis. Aorta: The aortic root is normal in size and structure. Venous: The inferior vena cava is normal in size with greater than 50% respiratory variability, suggesting right atrial pressure of 3 mmHg. IAS/Shunts: No atrial level shunt detected by color flow Doppler.  LEFT VENTRICLE PLAX 2D LVIDd:         4.20 cm   Diastology LVIDs:         2.70 cm   LV e' medial:    9.46 cm/s LV PW:         1.30 cm   LV E/e' medial:  6.8 LV IVS:        1.00 cm   LV e' lateral:   8.59 cm/s LVOT diam:     1.90 cm   LV E/e' lateral: 7.5 LV SV:         70 LV SV Index:   39 LVOT Area:     2.84 cm  RIGHT VENTRICLE RV Basal diam:  2.95 cm RV Mid diam:    2.80 cm RV S prime:     12.50 cm/s TAPSE (M-mode): 2.4 cm LEFT ATRIUM             Index        RIGHT ATRIUM           Index LA diam:        3.00 cm 1.64 cm/m   RA Area:     17.50 cm LA Vol (A2C):   72.8 ml 39.88 ml/m  RA Volume:   48.00 ml  26.29 ml/m LA Vol (A4C):   29.9 ml 16.38 ml/m LA Biplane Vol: 46.8 ml 25.63 ml/m  AORTIC VALVE                     PULMONIC VALVE AV Area (Vmax):    1.77 cm      PV Vmax:       0.96 m/s AV Area (Vmean):   1.57 cm      PV Peak grad:  3.7 mmHg AV Area (VTI):     1.67 cm AV Vmax:           175.00  cm/s AV Vmean:          121.000 cm/s AV VTI:            0.420 m AV Peak Grad:      12.2 mmHg AV Mean Grad:      6.0 mmHg LVOT Vmax:         109.00 cm/s LVOT Vmean:        66.900 cm/s LVOT VTI:          0.248 m LVOT/AV VTI ratio: 0.59 AI PHT:            667 msec  AORTA Ao Root diam: 3.20 cm Ao Asc diam:  3.10 cm MITRAL VALVE  TRICUSPID VALVE MV Area (PHT): 3.10 cm    TR Peak grad:   17.0 mmHg MV Area VTI:   2.07 cm    TR Vmax:        206.00 cm/s MV Peak grad:  2.0 mmHg MV Mean grad:  1.0 mmHg    SHUNTS MV Vmax:       0.71 m/s    Systemic VTI:  0.25 m MV Vmean:      41.8 cm/s   Systemic Diam: 1.90 cm MV Decel Time: 245 msec MV E velocity: 64.50 cm/s MV A velocity: 78.60 cm/s MV E/A ratio:  0.82 Julien Nordmann MD Electronically signed by Julien Nordmann MD Signature Date/Time: 09/12/2022/1:56:59 PM    Final    DG Chest 2 View  Result Date: 09/11/2022 CLINICAL DATA:  Chest pain EXAM: CHEST - 2 VIEW COMPARISON:  CXR 02/06/22 FINDINGS: No pleural effusion. No pneumothorax. No focal airspace opacity. Normal cardiac and mediastinal contours. No radiographically apparent displaced rib fractures. Visualized upper abdomen is unremarkable. Vertebral body heights are maintained. IMPRESSION: No focal airspace opacity Electronically Signed   By: Lorenza Cambridge M.D.   On: 09/11/2022 15:19    Cardiac Studies   TTE 09/12/2022 1. Left ventricular ejection fraction, by estimation, is 60 to 65%. The  left ventricle has normal function. The left ventricle has no regional  wall motion abnormalities. Left ventricular diastolic parameters are  consistent with Grade I diastolic  dysfunction (impaired relaxation).   2. Right ventricular systolic function is normal. The right ventricular  size is normal. There is normal pulmonary artery systolic pressure.   3. The mitral valve is normal in structure. Mild mitral valve  regurgitation. No evidence of mitral stenosis.   4. The aortic valve is normal in structure.  Aortic valve regurgitation is  mild. Aortic valve sclerosis/calcification is present, without any  evidence of aortic stenosis.   5. The inferior vena cava is normal in size with greater than 50%  respiratory variability, suggesting right atrial pressure of 3 mmHg.   Patient Profile     67 y.o. female with history of CAD, hypertension, hyperlipidemia, OSA presenting with chest pain.  Troponins minimally elevated, not consistent with ACS.  Echo with preserved EF.  Assessment & Plan    Chest pain, history of SCAD -Minimally elevated troponins of 5, 22, 12 not consistent with ACS -Currently chest pain-free -Continue antianginals including Imdur, Toprol-XL, Ranexa -Continue aspirin, Crestor -Echo with normal EF, close follow-up with cardiologist as outpatient advised.   Patient can be discharged from a cardiac perspective, close follow-up with primary cardiologist as outpatient advised.  Follows up with [facility] cardiology.  Cardiology will sign off.  Total encounter time more than 50 minutes  Greater than 50% was spent in counseling and coordination of care with the patient and husband at bedside     Signed, Debbe Odea, MD  09/13/2022, 2:50 PM

## 2022-09-13 NOTE — Discharge Summary (Signed)
Physician Discharge Summary   Patient: Jenny Lee MRN: [id number] DOB: 01-29-1956  Admit date:     09/11/2022  Discharge date: 09/13/22  Discharge Physician: Baldwin Jamaica   PCP: Sherrie Mustache, MD   Recommendations at discharge:   Please discontinue Enalapril 20 mg daily. Please increase Ranolazine to 1000 mg twice daily. Please continue medication Imdur 30 mg daily. For anxiety, please increase Klonopin from 0.5 mg once daily to twice daily. Please follow up with Cardiologist Dr. Kathi Ludwig for a Cardiac CT Scan.  Hopefully this can be completed prior to your cholecystectomy.  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 *  Hospital Course: Per Dr. Mariah Milling: "Jenny Lee is a 67 y.o. female with a hx of CAD, MI in 2002, HTN, HLD, HTN, OSA on CPAP, Takotsubo cardiomyopathy and SCAD who is being seen 09/12/2022 for the evaluation of chest pain    Full cardiac history as detailed below Seen in 2002 for chest pain, cardiac workup at that time consistent with stress cardiomyopathy, cardiac catheterization performed at [facility] Recurrent chest pain 2017, left heart catheterization performed with concern for SCAD and stress cardiomyopathy Ejection fraction at that time 35% on catheterization Per the notes ejection fraction has normalized on medical management Followed recently by cardiology at [facility] for history of stress cardiomyopathy and paroxysmal tachycardia for paroxysmal SVT started on sotalol Outpatient Zio monitor performed but results not available Currently takes diltiazem ER 120 daily, metoprolol succinate 50 daily, sotalol 80 daily   For chronic chest pain symptoms takes isosorbide 30 daily, Ranexa 500 twice daily   Yesterday was being  evaluated by GI, while in the office developed tachycardia.  Heart rate measured 140 bpm, no EKG performed.  Was brought to the emergency room Cardiac enzymes negative, chest x-ray unremarkable Was started on heparin infusion Also reported having left arm pain concerning for prior anginal symptoms   Reports chest pain overnight requiring nitro x 2   On examination : alert oriented, no JVD, lungs clear to auscultation bilaterally, heart sounds regular normal S1-S2 no murmurs appreciated, abdomen soft nontender no significant lower extremity edema.  Musculoskeletal exam with good range of motion, neurologic exam grossly nonfocal"  Assessment and Plan:  Atypical Chest Pain, resolved / History of CAD: Patient had stress cardiomyopathy in 2002 and 2017. - Cardiology recommended holding Enalapril, continuing Imdur at 30 mg daily and increasing Ranexa from 500 to 1000 mg BID. - Troponins peaked at 22.  EKG showed no acute changes.  Echo showed normal EF and no RWMAs.  There was no indication for LHC. - Patient will follow up with Cardiologist Dr. Kathi Ludwig on Monday.  Recommend Cardiac CTA Scan to clear for cholecystectomy.   - Agree with switching to local Cardiologist Dr. Kirke Corin.  Symptomatic Cholelithiasis: - Patient is scheduled for cholecystectomy outpatient with Dr. Aleen Campi.  History of Takotsubo Cardiomyopathy / Anxiety: 2017 LHC was documented as probable Takotsubo cardiomyopathy and no evidence of CAD.  Patient is anxious and worried about her family member who recently had a stroke. - Increase Klonopin to 0.5 mg BID.  Prescription was provided.  Paroxysmal SVT: Heart rate is running in 50s. - Continue Sotalol and Diltiazem and Metoprolol at dosing per her Cardiologist. - Consider Holter Monitor with Cardiologist.  Hypertension:  - BP was stable.  Continue home meds.  Hyperlipidemia: - Statin.  OSA: - Recommend CPAP nightly.        Consultants: Cardiology Procedures performed:  None Disposition: Home Diet recommendation:  Discharge Diet Orders (From admission, onward)     Start     Ordered   09/13/22 0000  Diet - low sodium heart healthy        09/13/22 1241           Cardiac diet DISCHARGE MEDICATION: Allergies as of 09/13/2022       Reactions   No Known Allergies Other (See Comments)        Medication List     STOP taking these medications    enalapril 20 MG tablet Commonly known as: VASOTEC       TAKE these medications    aspirin 81 MG tablet Take 81 mg by mouth daily.   celecoxib 100 MG capsule Commonly known as: CELEBREX Take 100 mg by mouth 2 (two) times daily.   cetirizine 10 MG tablet Commonly known as: ZYRTEC Take 10 mg by mouth daily.   clonazePAM 0.5 MG tablet Commonly known as: KlonoPIN Take 1 tablet (0.5 mg total) by mouth 2 (two) times daily for 5 days. What changed:  when to take this reasons to take this   diltiazem 120 MG 24 hr capsule Commonly known as: CARDIZEM CD Take 120 mg by mouth daily.   estradiol 0.5 MG tablet Commonly known as: ESTRACE Take 0.5 mg by mouth daily.   furosemide 20 MG tablet Commonly known as: LASIX Take 10 mg by mouth daily as needed.   gabapentin 300 MG capsule Commonly known as: NEURONTIN Take 900 mg by mouth at bedtime.   isosorbide mononitrate 30 MG 24 hr tablet Commonly known as: IMDUR Take 30 mg by mouth daily.   Melatonin 10 MG Tabs Take 10 mg by mouth at bedtime.   metoprolol succinate 50 MG 24 hr tablet Commonly known as: TOPROL-XL Take 50 mg by mouth daily.   ondansetron 4 MG tablet Commonly known as: ZOFRAN Take 4 mg by mouth every 8 (eight) hours as needed for nausea.   pantoprazole 40 MG tablet Commonly known as: PROTONIX 40 mg daily.   ranolazine 1000 MG SR tablet Commonly known as: RANEXA Take 1 tablet (1,000 mg total) by mouth 2 (two) times daily. What changed:  medication strength how much to take   rosuvastatin 40 MG tablet Commonly  known as: CRESTOR Take 1 tablet by mouth daily.   sotalol 80 MG tablet Commonly known as: BETAPACE Take 1 tablet by mouth daily.   tiZANidine 2 MG tablet Commonly known as: ZANAFLEX Take 2 mg by mouth every 8 (eight) hours as needed.   Vitamin D (Ergocalciferol) 1.25 MG (50000 UNIT) Caps capsule Commonly known as: DRISDOL Take 50,000 Units by mouth every 7 (seven) days. Saturday        Discharge Exam: There were no vitals filed for this visit. Physical Exam Constitutional:      General: She is not in acute distress. HENT:     Head: Normocephalic and atraumatic.     Nose: Nose normal.     Mouth/Throat:     Mouth: Mucous membranes are moist.  Eyes:     Extraocular Movements: Extraocular movements intact.     Pupils: Pupils are equal, round, and reactive to light.  Cardiovascular:     Rate and Rhythm: Normal rate and regular rhythm.  Pulses: Normal pulses.     Heart sounds: Normal heart sounds.  Pulmonary:     Effort: Pulmonary effort is normal.     Breath sounds: Normal breath sounds.  Abdominal:     General: There is no distension.     Palpations: Abdomen is soft.     Tenderness: There is no abdominal tenderness.  Musculoskeletal:     Cervical back: Normal range of motion and neck supple.  Skin:    General: Skin is warm and dry.  Neurological:     General: No focal deficit present.  Psychiatric:     Comments: Little anxious      Condition at discharge: stable  The results of significant diagnostics from this hospitalization (including imaging, microbiology, ancillary and laboratory) are listed below for reference.   Imaging Studies: ECHOCARDIOGRAM COMPLETE  Result Date: 09/12/2022    ECHOCARDIOGRAM REPORT   Patient Name:   Jenny Lee Date of Exam: 09/12/2022 Medical Rec #:  [id number]        Height:       65.0 in Accession #:    [id number]       Weight:       165.6 lb Date of Birth:  05/04/1956        BSA:          1.826 m Patient Age:    66 years          BP:           96/53 mmHg Patient Gender: F                HR:           54 bpm. Exam Location:  [facility] Procedure: 2D Echo, Cardiac Doppler and Color Doppler Indications:     Chest Pain  History:         Patient has no prior history of Echocardiogram examinations.                  Acute MI, Arrythmias:Tachycardia, Signs/Symptoms:Chest Pain and                  Syncope; Risk Factors:Hypertension and Dyslipidemia. Takotsubo                  cardiomyopathy.  Sonographer:     Mikki Harbor Referring Phys:  [id number] Antonieta Iba Diagnosing Phys: Julien Nordmann MD IMPRESSIONS  1. Left ventricular ejection fraction, by estimation, is 60 to 65%. The left ventricle has normal function. The left ventricle has no regional wall motion abnormalities. Left ventricular diastolic parameters are consistent with Grade I diastolic dysfunction (impaired relaxation).  2. Right ventricular systolic function is normal. The right ventricular size is normal. There is normal pulmonary artery systolic pressure.  3. The mitral valve is normal in structure. Mild mitral valve regurgitation. No evidence of mitral stenosis.  4. The aortic valve is normal in structure. Aortic valve regurgitation is mild. Aortic valve sclerosis/calcification is present, without any evidence of aortic stenosis.  5. The inferior vena cava is normal in size with greater than 50% respiratory variability, suggesting right atrial pressure of 3 mmHg. FINDINGS  Left Ventricle: Left ventricular ejection fraction, by estimation, is 60 to 65%. The left ventricle has normal function. The left ventricle has no regional wall motion abnormalities. The left ventricular internal cavity size was normal in size. There is  no left ventricular hypertrophy. Left ventricular diastolic parameters are consistent with Grade I diastolic dysfunction (impaired relaxation). Right Ventricle:  The right ventricular size is normal. No increase in right ventricular wall thickness. Right  ventricular systolic function is normal. There is normal pulmonary artery systolic pressure. The tricuspid regurgitant velocity is 2.06 m/s, and  with an assumed right atrial pressure of 3 mmHg, the estimated right ventricular systolic pressure is 20.0 mmHg. Left Atrium: Left atrial size was normal in size. Right Atrium: Right atrial size was normal in size. Pericardium: There is no evidence of pericardial effusion. Mitral Valve: The mitral valve is normal in structure. Mild mitral valve regurgitation. No evidence of mitral valve stenosis. MV peak gradient, 2.0 mmHg. The mean mitral valve gradient is 1.0 mmHg. Tricuspid Valve: The tricuspid valve is normal in structure. Tricuspid valve regurgitation is mild . No evidence of tricuspid stenosis. Aortic Valve: The aortic valve is normal in structure. Aortic valve regurgitation is mild. Aortic regurgitation PHT measures 667 msec. Aortic valve sclerosis/calcification is present, without any evidence of aortic stenosis. Aortic valve mean gradient measures 6.0 mmHg. Aortic valve peak gradient measures 12.2 mmHg. Aortic valve area, by VTI measures 1.67 cm. Pulmonic Valve: The pulmonic valve was normal in structure. Pulmonic valve regurgitation is not visualized. No evidence of pulmonic stenosis. Aorta: The aortic root is normal in size and structure. Venous: The inferior vena cava is normal in size with greater than 50% respiratory variability, suggesting right atrial pressure of 3 mmHg. IAS/Shunts: No atrial level shunt detected by color flow Doppler.  LEFT VENTRICLE PLAX 2D LVIDd:         4.20 cm   Diastology LVIDs:         2.70 cm   LV e' medial:    9.46 cm/s LV PW:         1.30 cm   LV E/e' medial:  6.8 LV IVS:        1.00 cm   LV e' lateral:   8.59 cm/s LVOT diam:     1.90 cm   LV E/e' lateral: 7.5 LV SV:         70 LV SV Index:   39 LVOT Area:     2.84 cm  RIGHT VENTRICLE RV Basal diam:  2.95 cm RV Mid diam:    2.80 cm RV S prime:     12.50 cm/s TAPSE (M-mode): 2.4  cm LEFT ATRIUM             Index        RIGHT ATRIUM           Index LA diam:        3.00 cm 1.64 cm/m   RA Area:     17.50 cm LA Vol (A2C):   72.8 ml 39.88 ml/m  RA Volume:   48.00 ml  26.29 ml/m LA Vol (A4C):   29.9 ml 16.38 ml/m LA Biplane Vol: 46.8 ml 25.63 ml/m  AORTIC VALVE                     PULMONIC VALVE AV Area (Vmax):    1.77 cm      PV Vmax:       0.96 m/s AV Area (Vmean):   1.57 cm      PV Peak grad:  3.7 mmHg AV Area (VTI):     1.67 cm AV Vmax:           175.00 cm/s AV Vmean:          121.000 cm/s AV VTI:  0.420 m AV Peak Grad:      12.2 mmHg AV Mean Grad:      6.0 mmHg LVOT Vmax:         109.00 cm/s LVOT Vmean:        66.900 cm/s LVOT VTI:          0.248 m LVOT/AV VTI ratio: 0.59 AI PHT:            667 msec  AORTA Ao Root diam: 3.20 cm Ao Asc diam:  3.10 cm MITRAL VALVE               TRICUSPID VALVE MV Area (PHT): 3.10 cm    TR Peak grad:   17.0 mmHg MV Area VTI:   2.07 cm    TR Vmax:        206.00 cm/s MV Peak grad:  2.0 mmHg MV Mean grad:  1.0 mmHg    SHUNTS MV Vmax:       0.71 m/s    Systemic VTI:  0.25 m MV Vmean:      41.8 cm/s   Systemic Diam: 1.90 cm MV Decel Time: 245 msec MV E velocity: 64.50 cm/s MV A velocity: 78.60 cm/s MV E/A ratio:  0.82 Julien Nordmann MD Electronically signed by Julien Nordmann MD Signature Date/Time: 09/12/2022/1:56:59 PM    Final    DG Chest 2 View  Result Date: 09/11/2022 CLINICAL DATA:  Chest pain EXAM: CHEST - 2 VIEW COMPARISON:  CXR 02/06/22 FINDINGS: No pleural effusion. No pneumothorax. No focal airspace opacity. Normal cardiac and mediastinal contours. No radiographically apparent displaced rib fractures. Visualized upper abdomen is unremarkable. Vertebral body heights are maintained. IMPRESSION: No focal airspace opacity Electronically Signed   By: Lorenza Cambridge M.D.   On: 09/11/2022 15:19   US Abdomen Limited RUQ (LIVER/GB)  Result Date: 08/20/2022 CLINICAL DATA:  Right upper quadrant pain for 1 week. EXAM: ULTRASOUND ABDOMEN  LIMITED RIGHT UPPER QUADRANT COMPARISON:  CT chest 09/15/2018 FINDINGS: Gallbladder: Multiple stones in the gallbladder lumen. No gallbladder wall thickening or pericholecystic fluid. Negative sonographic Murphy's sign. Common bile duct: Diameter: 2.7 mm Liver: No focal lesion identified. Within normal limits in parenchymal echogenicity. Portal vein is patent on color Doppler imaging with normal direction of blood flow towards the liver. Other: None. IMPRESSION: Cholelithiasis without secondary signs of acute cholecystitis. Electronically Signed   By: Annia Belt M.D.   On: 08/20/2022 09:17    Microbiology: Results for orders placed or performed during the hospital encounter of 07/28/20  Urine Culture     Status: Abnormal   Collection Time: 07/28/20  4:23 PM   Specimen: Urine, Random  Result Value Ref Range Status   Specimen Description   Final    URINE, RANDOM Performed at [facility], [address], [place], [place] [address]    Special Requests   Final    NONE Performed at [facility], [address] [place], [place] [address]    Culture (A)  Final    60,000 COLONIES/mL LACTOBACILLUS SPECIES Standardized susceptibility testing for this organism is not available. Performed at [facility], [address], [place], [place] [address]    Report Status 07/30/2020 FINAL  Final    Labs: CBC: Recent Labs  Lab 09/10/22 1108 09/11/22 1450 09/12/22 0427 09/13/22 0435  WBC 6.8 8.4 7.8 7.8  HGB 11.8* 12.1 10.8* 11.5*  HCT 37.3 37.3 33.6* 36.6  MCV 82.3 80.9 82.4 81.7  PLT 276 269 252 267  Basic Metabolic Panel: Recent Labs  Lab 09/10/22 1108 09/11/22 1450 09/13/22 0435  NA 135 132* 137  K 4.7 3.8 4.0  CL 101 98 104  CO2 27 22 23   GLUCOSE 89 158* 97  BUN 22 19 22   CREATININE 0.94 0.93 0.98  CALCIUM 9.1 9.1 9.0  MG  --   --  2.3  PHOS  --   --  4.2   Liver Function Tests: No results for input(s): "AST", "ALT", "ALKPHOS", "BILITOT", "PROT",  "ALBUMIN" in the last 168 hours. CBG: No results for input(s): "GLUCAP" in the last 168 hours.  Discharge time spent: greater than 30 minutes.  Signed: Baldwin Jamaica, MD [facility] 09/13/2022

## 2022-09-15 ENCOUNTER — Encounter: Payer: Self-pay | Admitting: Surgery

## 2022-09-16 ENCOUNTER — Encounter: Payer: Self-pay | Admitting: Surgery

## 2022-09-17 ENCOUNTER — Telehealth: Payer: Self-pay | Admitting: Surgery

## 2022-09-17 ENCOUNTER — Encounter: Payer: Self-pay | Admitting: Surgery

## 2022-09-17 NOTE — Telephone Encounter (Signed)
Received call from patient's husband, Max.  At this time surgery for robotic cholecystectomy will be cancelled for 09/18/22 due to patient just recently in ED for several days due to chest pain.  Patient also had heart monitor placed by [facility] on 09/15/22 which she will be wearing for two weeks.  Per Dr. Aleen Campi, we will await those results and clearance from her cardiology team.  Surgery at this time will be postponed.  We will reevaluate patient in office and hopefully at that time will be cleared for surgery and can reschedule.  Patient and husband will keep [place] informed.

## 2022-09-18 ENCOUNTER — Encounter: Admission: RE | Payer: Self-pay | Source: Home / Self Care

## 2022-09-18 ENCOUNTER — Ambulatory Visit: Admission: RE | Admit: 2022-09-18 | Payer: Medicare HMO | Source: Home / Self Care | Admitting: Surgery

## 2022-09-18 HISTORY — DX: Other forms of angina pectoris: I20.89

## 2022-09-18 HISTORY — DX: Prediabetes: R73.03

## 2022-09-18 HISTORY — DX: Other ill-defined heart diseases: I51.89

## 2022-09-18 HISTORY — DX: Insomnia, unspecified: G47.00

## 2022-09-18 HISTORY — DX: Vitamin D deficiency, unspecified: E55.9

## 2022-09-18 SURGERY — CHOLECYSTECTOMY, ROBOT-ASSISTED, LAPAROSCOPIC
Anesthesia: General

## 2022-09-19 ENCOUNTER — Ambulatory Visit: Payer: Medicare HMO | Admitting: Cardiovascular Disease

## 2022-10-02 ENCOUNTER — Encounter: Payer: Self-pay | Admitting: Cardiovascular Disease

## 2022-10-02 ENCOUNTER — Ambulatory Visit: Payer: Medicare HMO | Attending: Cardiovascular Disease | Admitting: Cardiovascular Disease

## 2022-10-02 VITALS — BP 136/78 | HR 59 | Ht 65.0 in | Wt 168.0 lb

## 2022-10-02 DIAGNOSIS — Z8679 Personal history of other diseases of the circulatory system: Secondary | ICD-10-CM | POA: Diagnosis not present

## 2022-10-02 DIAGNOSIS — R072 Precordial pain: Secondary | ICD-10-CM

## 2022-10-02 DIAGNOSIS — I10 Essential (primary) hypertension: Secondary | ICD-10-CM

## 2022-10-02 DIAGNOSIS — I479 Paroxysmal tachycardia, unspecified: Secondary | ICD-10-CM

## 2022-10-02 DIAGNOSIS — E785 Hyperlipidemia, unspecified: Secondary | ICD-10-CM

## 2022-10-02 NOTE — Progress Notes (Signed)
Cardiology Office Note   Date:  10/02/2022   ID:  Jenny Lee, DOB [birth year], MRN [id number]  PCP:  Sherrie Mustache, MD  Cardiologist:   Lorine Bears, MD   Chief Complaint  Patient presents with   Pre op for robotic cholecystectomy     Patient c/o chest pain, pressure in neck, shortness of breath and feeling nervous at times.  Former patient at [facility] for SVT. Medications reviewed by the patient verbally.       History of Present Illness: Jenny Lee is a 67 y.o. female who presents to establish cardiovascular care with me.  She has known history of essential hypertension, hyperlipidemia and obstructive sleep apnea. Her cardiac records are inconsistent but I was able to obtain history based on my personal review of echocardiogram and cardiac catheterization images. Her initial presentation was in 2002.  She was 67 years old at that time and presented with non-ST elevation myocardial infarction.  Echocardiogram showed normal LV systolic function with apical wall motion abnormality. She underwent cardiac catheterization by Dr. Juliann Pares which showed occluded right PDA with an appearance highly suggestive of scad.  In addition, there was likely scad in the mid to distal LAD with apical wall motion abnormality and mild to moderately reduced LV systolic function.  The patient had an aortic balloon pump placement and was transferred to [facility] at that time where she was managed medically without revascularization. She underwent repeat cardiac catheterization by Dr. Welton Flakes in 2017 which showed completely normal coronary arteries with recanalization of the right PDA and normal appearance of the LAD.  There was persistent wall motion abnormality in the inferior apical region.   She has been most recently followed at [facility] cardiology for paroxysmal supraventricular tachycardia which was treated with sotalol. She was hospital's last month at [facility] with atypical chest pain.  In addition,  she had an episode of tachycardia with a heart rate of 140 bpm before her presentation.  Troponin was minimally elevated.  Echocardiogram showed normal LV systolic function with no significant valvular abnormalities. She needs to have gallbladder surgery done.  She continues to complain of substernal chest pain and tightness radiating to her neck mostly at rest.  Some symptoms with exertion.  She reports intermittent palpitations which she had 2 episodes recently when she was wearing the ZIO monitor.  The ZIO monitor was returned on Monday to [facility] and the results are not available to [place].   Past Medical History:  Diagnosis Date   Anxiety    a.) on BZO (clonazepam) PRN   Atypical angina    CAD (coronary artery disease) 11/2000   a.) s/p NSTEMI 11/2000 --> LHC 12/22/2000 --> 100% mPDA --> Tx'd with POBA; no PCI (stent) placed; SCAD of dLAD; b.) LHC 04/26/2003: normal coronaries; c.) LHC 11/26/2015: EF 35%, anteroapical aneurysm, normal coronary arteries   Diastolic dysfunction    a.) TTE 09/12/2022: EF 60-65%, AoV sclerosis, mild MR/AR, G1DD   Diverticulosis    Female genuine stress incontinence 08/15/2014   GERD (gastroesophageal reflux disease)    H. pylori infection    History of 2019 novel coronavirus disease (COVID-19) 05/21/2020   HLD (hyperlipidemia)    HTN (hypertension)    Insomnia    a.) takes melatonin   Kidney cysts    Mastalgia 10/06/2012   NSTEMI (non-ST elevated myocardial infarction) (HCC) 11/2000   a.) LHC/POBA - no PCI (stents) placed.  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   Takotsubo cardiomyopathy    a.) LHC 11/26/2015: EF 35%; b.) TTE 09/12/2022: EF 60-65%   Vitamin D deficiency     Past Surgical History:  Procedure Laterality Date   ABDOMINAL HYSTERECTOMY  05/26/2002   CARDIAC CATHETERIZATION N/A 11/26/2015   Procedure: Left Heart Cath and Coronary Angiography;  Surgeon: Laurier Nancy, MD;  Location: [facility] INVASIVE CV LAB;  Service: Cardiovascular;  Laterality: N/A;   CERVICAL SPINE SURGERY  05/26/1996   fusion   CLAVICLE SURGERY Right    shaved bone   COLONOSCOPY WITH PROPOFOL N/A 12/23/2016   Procedure: COLONOSCOPY WITH PROPOFOL;  Surgeon: [name], MD;  Location: [facility] ENDOSCOPY;  Service: Endoscopy;  Laterality: N/A;   COLONOSCOPY WITH PROPOFOL N/A 06/26/2017   Procedure: COLONOSCOPY WITH PROPOFOL;  Surgeon: Pasty Spillers, MD;  Location: [facility] ENDOSCOPY;  Service: Gastroenterology;  Laterality: N/A;   CORONARY BALLOON ANGIOPLASTY  11/2000   ESOPHAGOGASTRODUODENOSCOPY (EGD) WITH PROPOFOL N/A 12/23/2016   Procedure: ESOPHAGOGASTRODUODENOSCOPY (EGD) WITH PROPOFOL;  Surgeon: [name], MD;  Location: [facility] ENDOSCOPY;  Service: Endoscopy;  Laterality: N/A;   ESOPHAGOGASTRODUODENOSCOPY (EGD) WITH PROPOFOL N/A 03/31/2017   Procedure: ESOPHAGOGASTRODUODENOSCOPY (EGD) WITH PROPOFOL WITH GASTRIC MAPPING;  Surgeon: [name], MD;  Location: [facility] ENDOSCOPY;  Service: Gastroenterology;  Laterality: N/A;   ESOPHAGOGASTRODUODENOSCOPY (EGD) WITH PROPOFOL N/A 12/21/2017   Procedure: ESOPHAGOGASTRODUODENOSCOPY (EGD) WITH PROPOFOL;  Surgeon: [name], MD;  Location: [facility] ENDOSCOPY;  Service: Gastroenterology;  Laterality: N/A;   LEFT HEART CATH AND CORONARY ANGIOGRAPHY Left 04/26/2003   Procedure: LEFT HEART CATH AND CORONARY ANGIOGRAPHY; Location: [facility]; Surgeon: Rudean Hitt, MD   RENAL ANGIOGRAPHY Bilateral 03/02/2017   Procedure: RENAL ANGIOGRAPHY;  Surgeon: Annice Needy, MD;   Location: [facility] INVASIVE CV LAB;  Service: Cardiovascular;  Laterality: Bilateral;   VAGINAL PROLAPSE REPAIR       Current Outpatient Medications  Medication Sig Dispense Refill   aspirin 81 MG tablet Take 81 mg by mouth daily.     celecoxib (CELEBREX) 100 MG capsule Take 100 mg by mouth 2 (two) times daily.     cetirizine (ZYRTEC) 10 MG tablet Take 10 mg by mouth daily.     clonazePAM (KLONOPIN) 0.5 MG tablet Take 1 tablet (0.5 mg total) by mouth 2 (two) times daily for 5 days. 10 tablet 0   diltiazem (CARDIZEM CD) 120 MG 24 hr capsule Take 120 mg by mouth daily.     estradiol (ESTRACE) 0.5 MG tablet Take 0.5 mg by mouth daily.     furosemide (LASIX) 20 MG tablet Take 10 mg by mouth daily as needed.     gabapentin (NEURONTIN) 300 MG capsule Take 900 mg by mouth at bedtime.     isosorbide mononitrate (IMDUR) 30 MG 24 hr tablet Take 30 mg by mouth daily.     Melatonin 10 MG TABS Take 10 mg by mouth at bedtime.      metoprolol succinate (TOPROL-XL) 50 MG 24 hr tablet Take 50 mg by mouth daily.     ondansetron (ZOFRAN) 4 MG tablet Take 4 mg by mouth  every 8 (eight) hours as needed for nausea.   0   pantoprazole (PROTONIX) 40 MG tablet 40 mg daily.  2   ranolazine (RANEXA) 1000 MG SR tablet Take 1 tablet (1,000 mg total) by mouth 2 (two) times daily. 60 tablet 0   rosuvastatin (CRESTOR) 40 MG tablet Take 1 tablet by mouth daily.     sotalol (BETAPACE) 80 MG tablet Take 1 tablet by mouth daily.     tiZANidine (ZANAFLEX) 2 MG tablet Take 2 mg by mouth every 8 (eight) hours as needed.     Vitamin D, Ergocalciferol, (DRISDOL) 50000 units CAPS capsule Take 50,000 Units by mouth every 7 (seven) days. Saturday  1   No current facility-administered medications for this visit.    Allergies:   No known allergies    Social History:  The patient  reports that she has never smoked. She has never used smokeless tobacco. She reports that she does not currently use alcohol. She reports that she does not  use drugs.   Family History:  The patient's family history includes Breast cancer (age of onset: 77) in her sister; Hypertension in her mother.    ROS:  Please see the history of present illness.   Otherwise, review of systems are positive for none.   All other systems are reviewed and negative.    PHYSICAL EXAM: VS:  BP 136/78 (BP Location: Right Arm, Patient Position: Sitting, Cuff Size: Normal)   Pulse (!) 59   Ht 5\' 5"  (1.651 m)   Wt 168 lb (76.2 kg)   SpO2 98%   BMI 27.96 kg/m  , BMI Body mass index is 27.96 kg/m. GEN: Well nourished, well developed, in no acute distress  HEENT: normal  Neck: no JVD, carotid bruits, or masses Cardiac: RRR; no  rubs, or gallops,no edema .  1 out of 6 systolic murmur in the aortic area. Respiratory:  clear to auscultation bilaterally, normal work of breathing GI: soft, nontender, nondistended, + BS MS: no deformity or atrophy  Skin: warm and dry, no rash Neuro:  Strength and sensation are intact Psych: euthymic mood, full affect   EKG:  EKG is ordered today. The ekg ordered today demonstrates sinus bradycardia with nonspecific T wave changes.   Recent Labs: 02/06/2022: ALT 14 09/11/2022: B Natriuretic Peptide 80.5 09/13/2022: BUN 22; Creatinine, Ser 0.98; Hemoglobin 11.5; Magnesium 2.3; Platelets 267; Potassium 4.0; Sodium 137    Lipid Panel    Component Value Date/Time   CHOL 155 09/12/2022 1203   TRIG 52 09/12/2022 1203   HDL 54 09/12/2022 1203   CHOLHDL 2.9 09/12/2022 1203   VLDL 10 09/12/2022 1203   LDLCALC 91 09/12/2022 1203      Wt Readings from Last 3 Encounters:  10/02/22 168 lb (76.2 kg)  09/11/22 165 lb 9.6 oz (75.1 kg)  09/09/22 165 lb (74.8 kg)         10/02/2022    1:58 PM 10/02/2022    1:42 PM  PAD Screen  Previous PAD dx? No No  Previous surgical procedure? No No  Pain with walking? No No  Feet/toe relief with dangling? No No  Painful, non-healing ulcers? No No  Extremities discolored? No No   I spent  40 minutes reviewing the patient's previous cardiac records and also personal interpretation of cardiac catheterization and echocardiogram imaging.   ASSESSMENT AND PLAN:  1.  History of SCAD: This is based on my interpretation of her cardiac catheterization from 2002 that showed occluded right  PDA as well as significant disease in the mid to distal LAD also consistent with scad.  Subsequent cardiac catheterization in 2017 showed complete healing of the coronary arteries with no evidence of obstructive coronary artery disease. She now returns with recurrent atypical chest pain.  Given her history, I recommend evaluation with cardiac CTA especially that she needs to have cholecystectomy. She is on multiple antianginal medications including Imdur, Ranexa, Toprol and diltiazem.  2.  Paroxysmal supraventricular tachycardia: Her symptoms are reasonably controlled on sotalol, Toprol and diltiazem but she does complain of breakthrough tachycardia.  He will try to obtain the results of ZIO monitor that was recently done at [facility] and likely refer the patient to EP.  3.  Essential hypertension: Blood pressure is controlled on current medications.  4.  Hyperlipidemia: Currently on rosuvastatin.    Disposition:   FU with me in 2 months  Signed,  Lorine Bears, MD  10/02/2022 3:33 PM    [facility]

## 2022-10-02 NOTE — Patient Instructions (Addendum)
Medication Instructions:  No changes *If you need a refill on your cardiac medications before your next appointment, please call your pharmacy*   Lab Work: None ordered If you have labs (blood work) drawn today and your tests are completely normal, you will receive your results only by: MyChart Message (if you have MyChart) OR A paper copy in the mail If you have any lab test that is abnormal or we need to change your treatment, we will call you to review the results.  Follow-Up: At [facility], you and your health needs are our priority.  As part of our continuing mission to provide you with exceptional heart care, we have created designated Provider Care Teams.  These Care Teams include your primary Cardiologist (physician) and Advanced Practice Providers (APPs -  Physician Assistants and Nurse Practitioners) who all work together to provide you with the care you need, when you need it.  We recommend signing up for the patient portal called "MyChart".  Sign up information is provided on this After Visit Summary.  MyChart is used to connect with patients for Virtual Visits (Telemedicine).  Patients are able to view lab/test results, encounter notes, upcoming appointments, etc.  Non-urgent messages can be sent to your provider as well.   To learn more about what you can do with MyChart, go to [email or web address].    Your next appointment:   2 month(s)  Provider:   You may see Dr. Kirke Corin or one of the following Advanced Practice Providers on your designated Care Team:   Nicolasa Ducking, NP Eula Listen, PA-C Cadence Fransico Michael, PA-C Charlsie Quest, NP    Other Instructions   Your cardiac CT will be scheduled at one of the below locations:   [facility] [address] [place], [place] [address] [phone]  OR  [facility] [address] [place], [place] [address] [phone]  OR   [facility] [address] [place], [place] [address] [phone]  If scheduled at [facility], please arrive at the [facility] (Entrance C2) of [facility] 30 minutes prior to test start time. You can use the FREE valet parking offered at entrance C (encouraged to control the heart rate for the test)  Proceed to the [facility] Radiology Department (first floor) to check-in and test prep.  All radiology patients and guests should use entrance C2 at [facility], accessed from [facility], even though the hospital's physical address listed is [address]    If scheduled at [facility] or [facility], please arrive 15 mins early for check-in and test prep.   Please follow these instructions carefully (unless otherwise directed):  On the Night Before the Test: Be sure to Drink plenty of water. Do not consume any caffeinated/decaffeinated beverages or chocolate 12 hours prior to your test. Do not take any antihistamines 12 hours prior to your test.  On the Day of the Test: Drink plenty of water until 1 hour prior to the test. Do not eat any food 1 hour prior to test. You may take your regular medications prior to the test.  Take metoprolol two hours prior to test. If you take Furosemide, please HOLD on the morning of the test. FEMALES- please wear underwire-free bra if available, avoid dresses & tight clothing  After the Test: Drink plenty of water. After receiving IV contrast, you may experience a  mild flushed feeling. This is normal. On occasion, you may experience a mild rash up to 24 hours after the test. This is not dangerous. If this occurs, you can take Benadryl 25 mg and increase your fluid intake. If you experience trouble breathing, this can be serious. If it is severe call 911 IMMEDIATELY. If it is mild, please call our office. If you take any of these  medications: Glipizide/Metformin, Avandament, Glucavance, please do not take 48 hours after completing test unless otherwise instructed.  We will call to schedule your test 2-4 weeks out understanding that some insurance companies will need an authorization prior to the service being performed.   For non-scheduling related questions, please contact the cardiac imaging nurse navigator should you have any questions/concerns: Rockwell Alexandria, Cardiac Imaging Nurse Navigator Larey Brick, Cardiac Imaging Nurse Navigator [facility] Direct Office Dial: [phone]   For scheduling needs, including cancellations and rescheduling, please call [place], [phone].

## 2022-10-03 ENCOUNTER — Ambulatory Visit (INDEPENDENT_AMBULATORY_CARE_PROVIDER_SITE_OTHER): Payer: Medicare HMO | Admitting: Surgery

## 2022-10-03 ENCOUNTER — Encounter: Payer: Self-pay | Admitting: Surgery

## 2022-10-03 VITALS — BP 131/79 | HR 77 | Temp 97.9°F | Ht 65.0 in | Wt 167.2 lb

## 2022-10-03 DIAGNOSIS — K8020 Calculus of gallbladder without cholecystitis without obstruction: Secondary | ICD-10-CM

## 2022-10-03 NOTE — Patient Instructions (Signed)
Our surgery scheduler Britta Mccreedy will call you within 24-48 hours to get you scheduled. If you have not heard from her after 48 hours, please call our office. Have the blue sheet available when she calls to write down important information.  Last does of Aspirin should be 10/23/2022.   If you have any concerns or questions, please feel free to call our office.   Minimally Invasive Cholecystectomy Minimally invasive cholecystectomy is surgery to remove the gallbladder. The gallbladder is a pear-shaped organ that lies beneath the liver on the right side of the body. The gallbladder stores bile, which is a fluid that helps the body digest fats. Cholecystectomy is often done to treat inflammation (irritation and swelling) of the gallbladder (cholecystitis). This condition is usually caused by a buildup of gallstones (cholelithiasis) in the gallbladder or when the fluid in the gall bladder becomes stagnant because gallstones get stuck in the ducts (tubes) and block the flow of bile. This can result in inflammation and pain. In severe cases, emergency surgery may be required. This procedure is done through small incisions in the abdomen, instead of one large incision. It is also called laparoscopic surgery. A thin scope with a camera (laparoscope) is inserted through one incision. Then surgical instruments are inserted through the other incisions. In some cases, a minimally invasive surgery may need to be changed to a surgery that is done through a larger incision. This is called open surgery. Tell a health care provider about: Any allergies you have. All medicines you are taking, including vitamins, herbs, eye drops, creams, and over-the-counter medicines. Any problems you or family members have had with anesthetic medicines. Any bleeding problems you have. Any surgeries you have had. Any medical conditions you have. Whether you are pregnant or may be pregnant. What are the risks? Generally, this is a safe  procedure. However, problems may occur, including: Infection. Bleeding. Allergic reactions to medicines. Damage to nearby structures or organs. A gallstone remaining in the common bile duct. The common bile duct carries bile from the gallbladder to the small intestine. A bile leak from the liver or cystic duct after your gallbladder is removed. What happens before the procedure? When to stop eating and drinking Follow instructions from your health care provider about what you may eat and drink before your procedure. These may include: 8 hours before the procedure Stop eating most foods. Do not eat meat, fried foods, or fatty foods. Eat only light foods, such as toast or crackers. All liquids are okay except energy drinks and alcohol. 6 hours before the procedure Stop eating. Drink only clear liquids, such as water, clear fruit juice, black coffee, plain tea, and sports drinks. Do not drink energy drinks or alcohol. 2 hours before the procedure Stop drinking all liquids. You may be allowed to take medicines with small sips of water. If you do not follow your health care provider's instructions, your procedure may be delayed or canceled. Medicines Ask your health care provider about: Changing or stopping your regular medicines. This is especially important if you are taking diabetes medicines or blood thinners. Taking medicines such as aspirin and ibuprofen. These medicines can thin your blood. Do not take these medicines unless your health care provider tells you to take them. Taking over-the-counter medicines, vitamins, herbs, and supplements. General instructions If you will be going home right after the procedure, plan to have a responsible adult: Take you home from the hospital or clinic. You will not be allowed to drive. Care for  you for the time you are told. Do not use any products that contain nicotine or tobacco for at least 4 weeks before the procedure. These products include  cigarettes, chewing tobacco, and vaping devices, such as e-cigarettes. If you need help quitting, ask your health care provider. Ask your health care provider: How your surgery site will be marked. What steps will be taken to help prevent infection. These may include: Removing hair at the surgery site. Washing skin with a germ-killing soap. Taking antibiotic medicine. What happens during the procedure?  An IV will be inserted into one of your veins. You will be given one or both of the following: A medicine to help you relax (sedative). A medicine to make you fall asleep (general anesthetic). Your surgeon will make several small incisions in your abdomen. The laparoscope will be inserted through one of the small incisions. The camera on the laparoscope will send images to a monitor in the operating room. This lets your surgeon see inside your abdomen. A gas will be pumped into your abdomen. This will expand your abdomen to give the surgeon more room to perform the surgery. Other tools that are needed for the procedure will be inserted through the other incisions. The gallbladder will be removed through one of the incisions. Your common bile duct may be examined. If stones are found in the common bile duct, they may be removed. After your gallbladder has been removed, the incisions will be closed with stitches (sutures), staples, or skin glue. Your incisions will be covered with a bandage (dressing). The procedure may vary among health care providers and hospitals. What happens after the procedure? Your blood pressure, heart rate, breathing rate, and blood oxygen level will be monitored until you leave the hospital or clinic. You will be given medicines as needed to control your pain. You may have a drain placed in the incision. The drain will be removed a day or two after the procedure. Summary Minimally invasive cholecystectomy, also called laparoscopic cholecystectomy, is surgery to  remove the gallbladder using small incisions. Tell your health care provider about all the medical conditions you have and all the medicines you are taking for those conditions. Before the procedure, follow instructions about when to stop eating and drinking and changing or stopping medicines. Plan to have a responsible adult care for you for the time you are told after you leave the hospital or clinic. This information is not intended to replace advice given to you by your health care provider. Make sure you discuss any questions you have with your health care provider. Document Revised: 11/13/2020 Document Reviewed: 11/13/2020 Elsevier Patient Education  [place].

## 2022-10-03 NOTE — Progress Notes (Signed)
10/03/2022  History of Present Illness: Jenny Lee is a 67 y.o. female presenting for follow-up of symptomatic cholelithiasis.  Patient was last seen on 09/03/2022 for her symptomatic cholelithiasis and she was originally scheduled to have surgery on 09/18/2022.  However she presented a few days before surgery to the emergency room with chest pain and tachycardia.  She had a ZIO monitor placed and has been followed by cardiology at [facility] and at [facility].  She saw Dr. Kirke Corin yesterday.  CT coronary has been ordered.  The patient reports that from the abdominal standpoint, she continues to have episodes of nausea and discomfort in the right upper quadrant.  Denies anything worsening but the patient's symptoms have remained steady.  Past Medical History: Past Medical History:  Diagnosis Date   Anxiety    a.) on BZO (clonazepam) PRN   Atypical angina    CAD (coronary artery disease) 11/2000   a.) s/p NSTEMI 11/2000 --> LHC 12/22/2000 --> 100% mPDA --> Tx'd with POBA; no PCI (stent) placed; SCAD of dLAD; b.) LHC 04/26/2003: normal coronaries; c.) LHC 11/26/2015: EF 35%, anteroapical aneurysm, normal coronary arteries   Diastolic dysfunction    a.) TTE 09/12/2022: EF 60-65%, AoV sclerosis, mild MR/AR, G1DD   Diverticulosis    Female genuine stress incontinence 08/15/2014   GERD (gastroesophageal reflux disease)    H. pylori infection    History of 2019 novel coronavirus disease (COVID-19) 05/21/2020   HLD (hyperlipidemia)    HTN (hypertension)    Insomnia    a.) takes melatonin   Kidney cysts    Mastalgia 10/06/2012   NSTEMI (non-ST elevated myocardial infarction) (HCC) 11/2000   a.) LHC/POBA - no PCI (stents) placed.  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   Takotsubo cardiomyopathy    a.) LHC 11/26/2015: EF 35%; b.) TTE 09/12/2022: EF 60-65%   Vitamin D deficiency      Past Surgical History: Past Surgical History:  Procedure Laterality Date   ABDOMINAL HYSTERECTOMY  05/26/2002   CARDIAC CATHETERIZATION N/A 11/26/2015   Procedure: Left Heart Cath and Coronary Angiography;  Surgeon: Laurier Nancy, MD;  Location: [facility] INVASIVE CV LAB;  Service: Cardiovascular;  Laterality: N/A;   CERVICAL SPINE SURGERY  05/26/1996   fusion   CLAVICLE SURGERY Right    shaved bone   COLONOSCOPY WITH PROPOFOL N/A 12/23/2016   Procedure: COLONOSCOPY WITH PROPOFOL;  Surgeon: [name], MD;  Location: [facility] ENDOSCOPY;  Service: Endoscopy;  Laterality: N/A;   COLONOSCOPY WITH PROPOFOL N/A 06/26/2017   Procedure: COLONOSCOPY WITH PROPOFOL;  Surgeon: Pasty Spillers, MD;  Location: [facility] ENDOSCOPY;  Service: Gastroenterology;  Laterality: N/A;   CORONARY BALLOON ANGIOPLASTY  11/2000   ESOPHAGOGASTRODUODENOSCOPY (EGD) WITH PROPOFOL N/A 12/23/2016   Procedure: ESOPHAGOGASTRODUODENOSCOPY (EGD) WITH PROPOFOL;  Surgeon: [name], MD;  Location: [facility] ENDOSCOPY;  Service: Endoscopy;  Laterality: N/A;   ESOPHAGOGASTRODUODENOSCOPY (EGD) WITH PROPOFOL N/A 03/31/2017   Procedure: ESOPHAGOGASTRODUODENOSCOPY (EGD) WITH PROPOFOL WITH GASTRIC MAPPING;  Surgeon: [name], MD;  Location: [facility] ENDOSCOPY;  Service: Gastroenterology;  Laterality: N/A;   ESOPHAGOGASTRODUODENOSCOPY (EGD) WITH PROPOFOL N/A 12/21/2017   Procedure: ESOPHAGOGASTRODUODENOSCOPY (EGD) WITH PROPOFOL;  Surgeon: [name], MD;  Location: [facility] ENDOSCOPY;  Service: Gastroenterology;  Laterality: N/A;   LEFT HEART CATH AND CORONARY ANGIOGRAPHY Left 04/26/2003    Procedure: LEFT HEART CATH AND CORONARY ANGIOGRAPHY; Location: [facility]; Surgeon: Rudean Hitt, MD   RENAL ANGIOGRAPHY Bilateral 03/02/2017   Procedure: RENAL ANGIOGRAPHY;  Surgeon: Annice Needy, MD;  Location: [facility] INVASIVE CV LAB;  Service: Cardiovascular;  Laterality: Bilateral;   VAGINAL PROLAPSE REPAIR      Home Medications: Prior to Admission medications   Medication Sig Start Date End Date Taking? Authorizing Provider  aspirin 81 MG tablet Take 81 mg by mouth daily.   Yes [provider]  celecoxib (CELEBREX) 100 MG capsule Take 100 mg by mouth 2 (two) times daily. 06/25/21  Yes [provider]  cetirizine (ZYRTEC) 10 MG tablet Take 10 mg by mouth daily.   Yes [provider]  diltiazem (CARDIZEM CD) 120 MG 24 hr capsule Take 120 mg by mouth daily. 06/02/22  Yes [provider]  estradiol (ESTRACE) 0.5 MG tablet Take 0.5 mg by mouth daily.   Yes [provider]  furosemide (LASIX) 20 MG tablet Take 10 mg by mouth daily as needed. 01/31/22  Yes [provider]  gabapentin (NEURONTIN) 300 MG capsule Take 900 mg by mouth at bedtime. 03/25/18  Yes [provider]  isosorbide mononitrate (IMDUR) 30 MG 24 hr tablet Take 30 mg by mouth daily. 01/31/21  Yes [provider]  Melatonin 10 MG TABS Take 10 mg by mouth at bedtime.    Yes [provider]  metoprolol succinate (TOPROL-XL) 50 MG 24 hr tablet Take 50 mg by mouth daily. 06/02/22  Yes [provider]  ondansetron (ZOFRAN) 4 MG tablet Take 4 mg by mouth every 8 (eight) hours as needed for nausea.  12/01/16  Yes [provider]  pantoprazole (PROTONIX) 40 MG tablet 40 mg daily. 08/28/17  Yes [provider]  ranolazine (RANEXA) 1000 MG SR tablet Take 1 tablet (1,000 mg total) by mouth 2 (two) times daily. 09/13/22 10/13/22 Yes Masoud, Dahlia Client, MD  rosuvastatin (CRESTOR) 40 MG tablet Take 1 tablet by mouth daily. 01/18/19  Yes [provider]   sotalol (BETAPACE) 80 MG tablet Take 1 tablet by mouth daily. 01/28/22  Yes [provider]  tiZANidine (ZANAFLEX) 2 MG tablet Take 2 mg by mouth every 8 (eight) hours as needed. 08/19/22  Yes [provider]  Vitamin D, Ergocalciferol, (DRISDOL) 50000 units CAPS capsule Take 50,000 Units by mouth every 7 (seven) days. Saturday 11/06/17  Yes [provider]  clonazePAM (KLONOPIN) 0.5 MG tablet Take 1 tablet (0.5 mg total) by mouth 2 (two) times daily for 5 days. 09/13/22 10/02/22  Baldwin Jamaica, MD    Allergies: Allergies  Allergen Reactions   No Known Allergies Other (See Comments)    Review of Systems: Review of Systems  Constitutional:  Negative for chills and fever.  Respiratory:  Negative for shortness of breath.   Cardiovascular:  Negative for chest pain.  Gastrointestinal:  Positive for abdominal pain, nausea and vomiting.  Genitourinary:  Negative for dysuria.  Musculoskeletal:  Negative for myalgias.  Skin:  Negative for rash.    Physical Exam BP 131/79   Pulse 77   Temp 97.9 F (36.6 C) (Oral)   Ht 5\' 5"  (1.651 m)   Wt 167 lb 3.2 oz (75.8 kg)  SpO2 97%   BMI 27.82 kg/m  CONSTITUTIONAL: No acute distress, well-nourished HEENT:  Normocephalic, atraumatic, extraocular motion intact. RESPIRATORY:  Lungs are clear, and breath sounds are equal bilaterally. Normal respiratory effort without pathologic use of accessory muscles. CARDIOVASCULAR: Heart is regular without murmurs, gallops, or rubs. GI: The abdomen is soft, nondistended, with some discomfort in the right upper quadrant.  Negative Murphy sign.  NEUROLOGIC:  Motor and sensation is grossly normal.  Cranial nerves are grossly intact. PSYCH:  Alert and oriented to person, place and time. Affect is normal.  Labs/Imaging: Labs from 09/13/2022: Sodium 137, potassium 4.0, chloride 104, CO2 23, BUN 22, creatinine 0.98.  WBC 7.8, hemoglobin 11.5, hematocrit 36.6, platelets 267.  Ultrasound RUQ on  08/20/2022: FINDINGS: Gallbladder: Multiple stones in the gallbladder lumen. No gallbladder wall thickening or pericholecystic fluid. Negative sonographic Murphy's sign.   Common bile duct: Diameter: 2.7 mm   Liver: No focal lesion identified. Within normal limits in parenchymal echogenicity. Portal vein is patent on color Doppler imaging with normal direction of blood flow towards the liver.   Other: None.   IMPRESSION: Cholelithiasis without secondary signs of acute cholecystitis.  Assessment and Plan: This is a 67 y.o. female with symptomatic cholelithiasis.  - The patient surgery was originally scheduled for 09/18/2022 but this was canceled due to tachycardia and chest pain.  She is being worked up with the cardiology team and currently has a CT coronary pending.  Also results from the [facility] monitor are pending.  Discussed with the patient tentatively we could schedule her surgery for 10/29/2022, pending for sure on cardiology clearance.  Will send form to Dr. Jari Sportsman office.  Discussed with the patient that from her surgery will also want her to hold her aspirin if possible.  If so, the last dose of aspirin will be on 10/23/2022. - Discussed again with her the plan for robotic assisted cholecystectomy and reviewed again the surgery with her including the planned incisions, risks of bleeding, infection, injury to surrounding structures, that this would be an outpatient procedure, the use of ICG for better evaluation of the biliary anatomy, operative activity restrictions, pain control, and she is willing to proceed. -In the meantime, I again reminded her of a strict low-fat diet to decrease the stress on her gallbladder. - All of her questions have been answered  I spent 30 minutes dedicated to the care of this patient on the date of this encounter to include pre-visit review of records, face-to-face time with the patient discussing diagnosis and management, and any post-visit coordination  of care.   Howie Ill, MD [facility]

## 2022-10-06 ENCOUNTER — Telehealth: Payer: Self-pay | Admitting: Surgery

## 2022-10-06 NOTE — Telephone Encounter (Signed)
Patient has been advised of Pre-Admission date/time, and Surgery date at [facility]  Surgery Date: 10/29/22 Preadmission Testing Date: 10/21/22 (phone 8a-1p)  Patient has been made aware to call [phone], between 1-3:00pm the day before surgery, to find out what time to arrive for surgery.

## 2022-10-21 ENCOUNTER — Encounter
Admission: RE | Admit: 2022-10-21 | Discharge: 2022-10-21 | Disposition: A | Discharge status: Home / Self Care | Payer: Medicare HMO | Source: Ambulatory Visit | Attending: Surgery | Admitting: Surgery

## 2022-10-21 ENCOUNTER — Encounter: Payer: Self-pay | Admitting: Surgery

## 2022-10-21 ENCOUNTER — Other Ambulatory Visit: Payer: Self-pay

## 2022-10-21 NOTE — Patient Instructions (Signed)
Your procedure is scheduled on: Report to the Registration Desk on the 1st floor of the Medical Mall. To find out your arrival time, please call [phone] between 1PM - 3PM on: If your arrival time is 6:00 am, do not arrive before that time as the Medical Mall entrance doors do not open until 6:00 am.  REMEMBER: Instructions that are not followed completely may result in serious medical risk, up to and including death; or upon the discretion of your surgeon and anesthesiologist your surgery may need to be rescheduled.  Do not eat food after midnight the night before surgery.  No gum chewing or hard candies.  You may however, drink CLEAR liquids up to 2 hours before you are scheduled to arrive for your surgery. Do not drink anything within 2 hours of your scheduled arrival time.  Clear liquids include: - water  - apple juice without pulp - gatorade (not RED colors) - black coffee or tea (Do NOT add milk or creamers to the coffee or tea) Do NOT drink anything that is not on this list.  Type 1 and Type 2 diabetics should only drink water.  In addition, your doctor has ordered for you to drink the provided:  Ensure Pre-Surgery Clear Carbohydrate Drink  Gatorade G2 Drinking this carbohydrate drink up to two hours before surgery helps to reduce insulin resistance and improve patient outcomes. Please complete drinking 2 hours before scheduled arrival time.  One week prior to surgery: Stop Anti-inflammatories (NSAIDS) such as Advil, Aleve, Ibuprofen, Motrin, Naproxen, Naprosyn and Aspirin based products such as Excedrin, Goody's Powder, BC Powder. Stop ANY OVER THE COUNTER supplements until after surgery. You may however, continue to take Tylenol if needed for pain up until the day of surgery.  Continue taking all prescribed medications with the exception of the following:  **Follow guidelines for insulin and diabetes medications**  Follow recommendations from Cardiologist or PCP  regarding stopping blood thinners.  TAKE ONLY THESE MEDICATIONS THE MORNING OF SURGERY WITH A SIP OF WATER:    Antacid (take one the night before and one on the morning of surgery - helps to prevent nausea after surgery.)  Use inhalers on the day of surgery and bring to the hospital.  Fleets enema or bowel prep as directed.  No Alcohol for 24 hours before or after surgery.  No Smoking including e-cigarettes for 24 hours before surgery.  No chewable tobacco products for at least 6 hours before surgery.  No nicotine patches on the day of surgery.  Do not use any "recreational" drugs for at least a week (preferably 2 weeks) before your surgery.  Please be advised that the combination of cocaine and anesthesia may have negative outcomes, up to and including death. If you test positive for cocaine, your surgery will be cancelled.  On the morning of surgery brush your teeth with toothpaste and water, you may rinse your mouth with mouthwash if you wish. Do not swallow any toothpaste or mouthwash.  Use CHG Soap or wipes as directed on instruction sheet.  Do not wear jewelry, make-up, hairpins, clips or nail polish.  Do not wear lotions, powders, or perfumes.   Do not shave body hair from the neck down 48 hours before surgery.  Contact lenses, hearing aids and dentures may not be worn into surgery.  Do not bring valuables to the hospital.  is not responsible for any missing/lost belongings or valuables.   Total Shoulder Arthroplasty:  use Benzoyl Peroxide 5% Gel as   directed on instruction sheet.  Bring your C-PAP to the hospital in case you may have to spend the night.   Notify your doctor if there is any change in your medical condition (cold, fever, infection).  Wear comfortable clothing (specific to your surgery type) to the hospital.  After surgery, you can help prevent lung complications by doing breathing exercises.  Take deep breaths and cough every 1-2 hours.  Your doctor may order a device called an Incentive Spirometer to help you take deep breaths. When coughing or sneezing, hold a pillow firmly against your incision with both hands. This is called "splinting." Doing this helps protect your incision. It also decreases belly discomfort.  If you are being admitted to the hospital overnight, leave your suitcase in the car. After surgery it may be brought to your room.  In case of increased patient census, it may be necessary for you, the patient, to continue your postoperative care in the Same Day Surgery department.  If you are being discharged the day of surgery, you will not be allowed to drive home. You will need a responsible individual to drive you home and stay with you for 24 hours after surgery.   If you are taking public transportation, you will need to have a responsible individual with you.  Please call the Pre-admissions Testing Dept. at [phone] if you have any questions about these instructions.  Surgery Visitation Policy:  Patients having surgery or a procedure may have two visitors.  Children under the age of 16 must have an adult with them who is not the patient.  Inpatient Visitation:    Visiting hours are 7 a.m. to 8 p.m. Up to four visitors are allowed at one time in a patient room. The visitors may rotate out with other people during the day.  One visitor age 16 or older may stay with the patient overnight and must be in the room by 8 p.m.   

## 2022-10-21 NOTE — Patient Instructions (Addendum)
Your procedure is scheduled on: 10/29/22  Report to the Registration Desk on the 1st floor of the Medical Mall. To find out your arrival time, please call [phone] between 1PM - 3PM on:  10/28/2022  If your arrival time is 6:00 am, do not arrive before that time as the Medical Mall entrance doors do not open until 6:00 am.  REMEMBER: Instructions that are not followed completely may result in serious medical risk, up to and including death; or upon the discretion of your surgeon and anesthesiologist your surgery may need to be rescheduled.  Do not eat food after midnight the night before surgery.  No gum chewing or hard candies.  You may however, drink CLEAR liquids up to 2 hours before you are scheduled to arrive for your surgery. Do not drink anything within 2 hours of your scheduled arrival time.  Clear liquids include: - water  - apple juice without pulp - gatorade (not RED colors) - black coffee or tea (Do NOT add milk or creamers to the coffee or tea) Do NOT drink anything that is not on this list.   One week prior to surgery: Stop Anti-inflammatories (NSAIDS) such as Advil, Aleve, Ibuprofen, Motrin, Naproxen, Naprosyn and Aspirin based products such as Excedrin, Goody's Powder, BC Powder. Stop ANY OVER THE COUNTER supplements until after surgery.  You may however, continue to take Tylenol if needed for pain up until the day of surgery.  Continue taking all prescribed medications with the exception of the following:                    Last dose of aspirin on 10/23/2022 . Follow recommendations from Cardiologist or PCP, surgeon  regarding stopping blood thinners.  TAKE ONLY THESE MEDICATIONS THE MORNING OF SURGERY WITH A SIP OF WATER:  celecoxib (CELEBREX  diltiazem (CARDIZEM CD)  isosorbide mononitrate  metoprolol succinate (TOPROL-XL)  ranolazine (RANEXA  rosuvastatin (CRESTOR  sotalol (BETAPACE) 80 MG tablet   No Alcohol for 24 hours before or after surgery.  No  Smoking including e-cigarettes for 24 hours before surgery.  No chewable tobacco products for at least 6 hours before surgery.  No nicotine patches on the day of surgery.  Do not use any "recreational" drugs for at least a week (preferably 2 weeks) before your surgery.  Please be advised that the combination of cocaine and anesthesia may have negative outcomes, up to and including death. If you test positive for cocaine, your surgery will be cancelled.  On the morning of surgery brush your teeth with toothpaste and water, you may rinse your mouth with mouthwash if you wish. Do not swallow any toothpaste or mouthwash.  Use CHG Soap as directed on instruction sheet.  Do not wear jewelry, make-up, hairpins, clips or nail polish.  Do not wear lotions, powders, or perfumes.   Do not shave body hair from the neck down 48 hours before surgery.  Contact lenses, hearing aids and dentures may not be worn into surgery.  Do not bring valuables to the hospital. [facility] is not responsible for any missing/lost belongings or valuables.   Bring your C-PAP to the hospital in case you may have to spend the night.   Notify your doctor if there is any change in your medical condition (cold, fever, infection).  Wear comfortable clothing (specific to your surgery type) to the hospital.  After surgery, you can help prevent lung complications by doing breathing exercises.  Take deep breaths and cough every  1-2 hours. Your doctor may order a device called an Incentive Spirometer to help you take deep breaths.   If you are being discharged the day of surgery, you will not be allowed to drive home. You will need a responsible individual to drive you home and stay with you for 24 hours after surgery.   Please call the Pre-admissions Testing Dept. at [phone] if you have any questions about these instructions.  Surgery Visitation Policy:  Patients having surgery or a procedure may have two  visitors.  Children under the age of 37 must have an adult with them who is not the patient.  Inpatient Visitation:    Visiting hours are 7 a.m. to 8 p.m. Up to four visitors are allowed at one time in a patient room. The visitors may rotate out with other people during the day.  One visitor age 70 or older may stay with the patient overnight and must be in the room by 8 p.m.      Preparing for Surgery with CHLORHEXIDINE GLUCONATE (CHG) Soap  Chlorhexidine Gluconate (CHG) Soap  o An antiseptic cleaner that kills germs and bonds with the skin to continue killing germs even after washing  o Used for showering the night before surgery and morning of surgery  Before surgery, you can play an important role by reducing the number of germs on your skin.  CHG (Chlorhexidine gluconate) soap is an antiseptic cleanser which kills germs and bonds with the skin to continue killing germs even after washing.  Please do not use if you have an allergy to CHG or antibacterial soaps. If your skin becomes reddened/irritated stop using the CHG.  1. Shower the NIGHT BEFORE SURGERY and the MORNING OF SURGERY with CHG soap.  2. If you choose to wash your hair, wash your hair first as usual with your normal shampoo.  3. After shampooing, rinse your hair and body thoroughly to remove the shampoo.  4. Use CHG as you would any other liquid soap. You can apply CHG directly to the skin and wash gently with a scrungie or a clean washcloth.  5. Apply the CHG soap to your body only from the neck down. Do not use on open wounds or open sores. Avoid contact with your eyes, ears, mouth, and genitals (private parts). Wash face and genitals (private parts) with your normal soap.  6. Wash thoroughly, paying special attention to the area where your surgery will be performed.  7. Thoroughly rinse your body with warm water.  8. Do not shower/wash with your normal soap after using and rinsing off the CHG soap.  9.  Pat yourself dry with a clean towel.  10. Wear clean pajamas to bed the night before surgery.  12. Place clean sheets on your bed the night of your first shower and do not sleep with pets.  13. Shower again with the CHG soap on the day of surgery prior to arriving at the hospital.  14. Do not apply any deodorants/lotions/powders.  15. Please wear clean clothes to the hospital.

## 2022-10-22 ENCOUNTER — Encounter: Payer: Self-pay | Admitting: Surgery

## 2022-10-23 ENCOUNTER — Telehealth: Payer: Self-pay | Admitting: Surgery

## 2022-10-23 NOTE — Telephone Encounter (Signed)
Updated information regarding rescheduled surgery with Dr. Aleen Campi.  Surgery had to be rescheduled as patient is pending CTA on 11/12/22 and will be awaiting cardiac clearance.    Patient has been advised of Pre-Admission date/time, and Surgery date at [facility].  Surgery Date: 11/20/22 Preadmission Testing Date: 11/12/22 (phone 1p-4p)  Patient has been made aware to call [phone], between 1-3:00pm the day before surgery, to find out what time to arrive for surgery.    Also patient has been reminded that her last dose of aspirin will need to be on 11/14/22, after that no more aspirin per Dr. Aleen Campi until after her surgery.  Patient verbalized understanding.

## 2022-11-11 ENCOUNTER — Telehealth: Payer: Self-pay | Admitting: Surgery

## 2022-11-11 ENCOUNTER — Telehealth (HOSPITAL_COMMUNITY): Payer: Self-pay | Admitting: Emergency Medicine

## 2022-11-11 ENCOUNTER — Other Ambulatory Visit: Payer: Self-pay | Admitting: Physician Assistant

## 2022-11-11 MED ORDER — ONDANSETRON HCL 4 MG PO TABS: 4.0000 mg | ORAL_TABLET | Freq: Three times a day (TID) | ORAL | 0 refills | Status: AC | PRN

## 2022-11-11 NOTE — Telephone Encounter (Signed)
Patient having robotic cholecystectomy on 11/20/22 with Dr. Aleen Campi.  She is having a lot of nausea and is wondering if can be called in anti nausea medication. She uses [facility] on [address] in [place].  Please call patient to let her know if something will be called in. Thank you.

## 2022-11-11 NOTE — Telephone Encounter (Signed)
Reaching out to patient to offer assistance regarding upcoming cardiac imaging study; pt verbalizes understanding of appt date/time, parking situation and where to check in, pre-test NPO status and medications ordered, and verified current allergies; name and call back number provided for further questions should they arise Allysha Tryon RN Navigator Cardiac Imaging [facility] [phone] office [phone] cell 

## 2022-11-12 ENCOUNTER — Encounter
Admission: RE | Admit: 2022-11-12 | Discharge: 2022-11-12 | Disposition: A | Discharge status: Home / Self Care | Payer: Medicare HMO | Source: Ambulatory Visit | Attending: Cardiovascular Disease | Admitting: Cardiovascular Disease

## 2022-11-12 ENCOUNTER — Ambulatory Visit
Admission: RE | Admit: 2022-11-12 | Discharge: 2022-11-12 | Disposition: A | Discharge status: Home / Self Care | Payer: Medicare HMO | Source: Ambulatory Visit | Attending: Cardiovascular Disease | Admitting: Cardiovascular Disease

## 2022-11-12 DIAGNOSIS — R072 Precordial pain: Secondary | ICD-10-CM | POA: Insufficient documentation

## 2022-11-12 LAB — POCT I-STAT CREATININE: Creatinine, Ser: 1.1 mg/dL — ABNORMAL HIGH (ref 0.44–1.00)

## 2022-11-12 MED ORDER — IOHEXOL 350 MG/ML SOLN
100.0000 mL | Freq: Once | INTRAVENOUS | Status: AC | PRN
  Administered 2022-11-12: 100 mL via INTRAVENOUS

## 2022-11-12 MED ORDER — NITROGLYCERIN 0.4 MG SL SUBL
0.8000 mg | SUBLINGUAL_TABLET | Freq: Once | SUBLINGUAL | Status: AC
  Administered 2022-11-12: 0.8 mg via SUBLINGUAL
  Filled 2022-11-12: qty 25

## 2022-11-12 NOTE — Progress Notes (Signed)
Patient tolerated procedure well. Ambulate w/o difficulty. Denies any lightheadedness or being dizzy. Pt denies any pain at this time. Sitting in chair, pt is encouraged to drink additional water throughout the day and reason explained to patient. Patient verbalized understanding and all questions answered. ABC intact. No further needs at this time. Discharge from procedure area w/o issues.  

## 2022-11-14 ENCOUNTER — Telehealth: Payer: Self-pay | Admitting: *Deleted

## 2022-11-14 NOTE — Telephone Encounter (Addendum)
     Primary Cardiologist: Lorine Bears, MD  Chart reviewed as part of pre-operative protocol coverage. Given past medical history and time since last visit, based on ACC/AHA guidelines, KRISTEE ANGUS would be at acceptable risk for the planned procedure without further cardiovascular testing.   Her RCRI is a class I risk, 0.4% risk of major cardiac event.  Patient's aspirin is not prescribed by cardiology.  Recommendations for holding aspirin will need to come from prescribing provider.  I will route this recommendation to the requesting party via Epic fax function and remove from pre-op pool.  Please call with questions.  Thomasene Ripple. Delene Morais NP-C     11/14/2022, 10:28 AM [facility] [address] Office [phone] Fax [phone]

## 2022-11-14 NOTE — Telephone Encounter (Signed)
-----   Message from Verlee Monte, NP sent at 11/14/2022 10:10 AM EDT ----- Regarding: Request for pre-operative cardiac clearance Request for pre-operative cardiac clearance:  1. What type of surgery is being performed?  XI ROBOTIC ASSISTED LAPAROSCOPIC CHOLECYSTECTOMY  2. When is this surgery scheduled?  11/20/2022  3. Type of clearance being requested (medical, pharmacy, both)? MEDICAL   4. Are there any medications that need to be held prior to surgery? ASA  5. Practice name and name of physician performing surgery?  Performing surgeon: Dr. Henrene Dodge, MD Requesting clearance: Quentin Mulling, FNP-C    6. Anesthesia type (none, local, MAC, general)? GENERAL  7. What is the office phone and fax number?   Phone: [phone] Fax: [phone]  ATTENTION: Unable to create telephone message as per your standard workflow. Directed by [facility] providers to send requests for cardiac clearance to this pool for appropriate distribution to provider covering pre-operative clearances.   Quentin Mulling, MSN, APRN, FNP-C, CEN [facility]  Peri-operative Services Nurse Practitioner Phone: [phone] 11/14/22 10:10 AM

## 2022-11-14 NOTE — Progress Notes (Signed)
Perioperative / Anesthesia Services  Pre-Admission Testing Clinical Review / Preoperative Anesthesia Consult  Date: 11/14/22  Patient Demographics:  Name: Jenny Lee DOB:   1955-08-31 MRN:   [id number]  Planned Surgical Procedure(s):    Case: [id number] Date/Time: 11/20/22 0715   Procedures:      XI ROBOTIC ASSISTED LAPAROSCOPIC CHOLECYSTECTOMY     INDOCYANINE GREEN FLUORESCENCE IMAGING (ICG)   Anesthesia type: General   Pre-op diagnosis: symptomatic cholelithiasis   Location: [facility] OR ROOM 07 / [facility] FOR AN[facility]   Surgeons: Henrene Dodge, MD     NOTE: Available PAT nursing documentation and vital signs have been reviewed. Clinical nursing staff has updated patient's PMH/PSHx, current medication list, and drug allergies/intolerances to ensure comprehensive history available to assist in medical decision making as it pertains to the aforementioned surgical procedure and anticipated anesthetic course. Extensive review of available clinical information personally performed. Jenny Lee PMH and PSHx updated with any diagnoses/procedures that  may have been inadvertently omitted during her intake with the pre-admission testing department's nursing staff.  Clinical Discussion:  Jenny Lee is a 67 y.o. female who is submitted for pre-surgical anesthesia review and clearance prior to her undergoing the above procedure. Patient is a Former Games developer. Pertinent PMH includes: CAD, NSTEMI, Takotsubo cardiomyopathy, SCAD, diastolic dysfunction, PSVT, atypical angina, HTN, HLD, prediabetes, GERD (on daily PPI), OSAH (requires nocturnal PAP therapy), cholelithiasis, chronic pain syndrome, insomnia, anxiety (on BZO).  Patient is followed by cardiology Kirke Corin, MD). She was last seen in the cardiology clinic on 10/02/2022; notes reviewed. At the time of her clinic visit, patient  with complaints of retrosternal chest pain and tightness with (+) radiation into her neck. Symptom was mostly  perceived at rest, with some mild exacerbation noted with exertion. Patient denied any associated shortness of breath. She was also experiencing intermittent palpitations. Patient denied any shortness of breath, PND, orthopnea, significant peripheral edema, weakness, fatigue, vertiginous symptoms, or presyncope/syncope. Patient with a past medical history significant for cardiovascular diagnoses. Documented physical exam was grossly benign, providing no evidence of acute exacerbation and/or decompensation of the patient's known cardiovascular conditions.  Patient suffered an NSTEMI in 11/2000 at the age of 66. Diagnostic LEFT heart catheterization was performed on 12/22/2000 revealing 100% stenosis of the mid PDA.  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 Patient developed ST elevation on continuous cardiac monitoring.  IABP was placed and patient was transferred to [facility] for continuing care.  In review of the notes, no further intervention/revascularization was required.  Diagnostic LEFT heart catheterization was performed on 04/26/2003 revealing normal coronary anatomy with no evidence of obstructive arterial disease.  Repeat diagnostic LEFT heart catheterization was performed on 11/26/2015 revealing a moderately reduced left ventricular systolic function with an EF of 35%.  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  Again, coronary arteries were noted to be normal providing no visual evidence of obstructive coronary artery disease.  Findings consistent with Takotsubo cardiomyopathy.  Most recent TTE was performed on 09/12/2022 revealing a normal left-ventricular systolic function with a EF of 60 to 65%.  There were no regional wall motion abnormalities. Left ventricular diastolic Doppler parameters consistent with abnormal relaxation (G1DD).  Right ventricular size and  function normal.  PASP normal.  Mild mitral valve regurgitation and aortic valve sclerosis observed.  All transvalvular gradients were noted be normal providing no evidence suggestive of valvular stenosis.  Patient does have a history of PSVT that is managed with pharmacological therapy. With the prescribed interventions, patient reports improvement, however she continued to experience episodes of tachycardia.  Patient has had Holter study in the recent past, however results are unavailable for review at time of consult. Blood pressure reasonably controlled at 136/78 mmHg on currently prescribed CCB (diltiazem), diuretic (furosemide), nitrate (isosorbide mononitrate), and beta-blocker (metoprolol succinate + sotalol) therapies.  Additionally, patient on ranolazine for prevention of recurrent angina/anginal equivalent symptoms.  With the ranolazine and long-acting nitrate (isosorbide mononitrate) in place, patient reporting reduced episodes of recurrent angina/anginal equivalent symptoms. Patient is on rosuvastatin for her HLD diagnosis and ASCVD prevention.  Patient with a prediabetes diagnosis; no recent HgbA1c for review.  Patient does have an OSAH diagnosis and is reported to be compliant with prescribed nocturnal PAP therapy. Functional capacity felt to be limited, as patient was experiencing exertional chest pain/tightness. Further non-invasive workup was recommended via a cardiac CTA. No changes were made to her medication regimen.  Patient follow-up with outpatient cardiology in 2 months or sooner if needed.  Since patient was last seen by cardiology, she has undergone the recommended non-invasive testing.   Cardiac CTA performed on 11/12/2022 revealed normal coronary arteries. Calcium score was 0. Normal coronary origin with RIGHT sided dominance. No LAA thrombus. Pulmonary artery was normal in size. Recommendations were for consideration of alternative non-atherosclerotic causes of chest pain.    Jenny Lee is scheduled for an XI ROBOTIC ASSISTED LAPAROSCOPIC CHOLECYSTECTOMY on 11/20/2022 with Dr. Henrene Dodge, MD.  Given patient's past medical history significant for cardiovascular diagnoses, presurgical cardiac clearance was sought by the PAT team. Per cardiology, "RCRI is a class I risk, 0.4% risk of major cardiac event.  Given past medical history and time since last visit, based on ACC/AHA guidelines, Jenny Lee would be at ACCEPTABLE risk for the planned procedure without further cardiovascular testing".  In review of her medication reconciliation, it is noted that patient is currently on prescribed daily antithrombotic therapy. She has been instructed on recommendations for holding her daily low-dose ASA for 5 days prior to her procedure with plans to restart as soon as postoperative bleeding risk felt to be minimized by her attending surgeon. The patient has been instructed that her last dose of her ASA should be on 11/14/2022.  Patient denies previous perioperative complications with anesthesia in the past. In review of the available records, it is noted that patient underwent a general anesthetic course at [facility] (ASA III) in 03/2021 without documented complications.      11/12/2022   11:20 AM 11/12/2022   10:58 AM 10/21/2022   10:15 AM  Vitals with BMI  Height   5\' 5"   Weight   165 lbs  BMI   27.46  Systolic 123 140   Diastolic 76 88   Pulse 59 55     Providers/Specialists:   NOTE: Primary physician provider listed below. Patient may have been seen by APP or partner within same practice.   PROVIDER ROLE / SPECIALTY LAST Eligha Bridegroom, MD General Surgery (Surgeon) 10/03/2022  Sherrie Mustache, MD Primary Care Provider ???  Lorine Bears, MD Cardiology 10/02/2022  Mosetta Pigeon, MD Nephrology 06/24/2022  Buena Irish, MD Pain Management 08/19/2022   Allergies:  No known allergies  Current Home  Medications:   No current facility-administered medications for this encounter.    aspirin 81 MG tablet   celecoxib (CELEBREX) 100 MG  capsule   cetirizine (ZYRTEC) 10 MG tablet   clonazePAM (KLONOPIN) 0.5 MG tablet   diltiazem (CARDIZEM CD) 120 MG 24 hr capsule   estradiol (ESTRACE) 0.5 MG tablet   furosemide (LASIX) 20 MG tablet   gabapentin (NEURONTIN) 300 MG capsule   isosorbide mononitrate (IMDUR) 30 MG 24 hr tablet   Melatonin 10 MG TABS   metoprolol succinate (TOPROL-XL) 50 MG 24 hr tablet   pantoprazole (PROTONIX) 40 MG tablet   ranolazine (RANEXA) 1000 MG SR tablet   rosuvastatin (CRESTOR) 40 MG tablet   sotalol (BETAPACE) 80 MG tablet   tiZANidine (ZANAFLEX) 2 MG tablet   Vitamin D, Ergocalciferol, (DRISDOL) 50000 units CAPS capsule   ondansetron (ZOFRAN) 4 MG tablet   History:   Past Medical History:  Diagnosis Date   Anxiety    a.) on BZO (clonazepam) PRN   Atypical angina    CAD (coronary artery disease) 11/2000   a.) s/p NSTEMI 11/2000 --> LHC 12/22/2000 --> 100% mPDA -->  no PCI (stent) placed; SCAD of dLAD; b.) LHC 04/26/2003: normal coronaries; c.) LHC 11/26/2015: EF 35%, anteroapical aneurysm, normal coronary arteries; d.) cCTA 11/12/2022: Ca2+ score 0   Chronic pain syndrome    Diastolic dysfunction    a.) TTE 09/12/2022: EF 60-65%, AoV sclerosis, mild MR/AR, G1DD   Diverticulosis    Dyspnea    Female genuine stress incontinence 08/15/2014   GERD (gastroesophageal reflux disease)    H. pylori infection    History of 2019 novel coronavirus disease (COVID-19) 05/21/2020   HLD (hyperlipidemia)    HTN (hypertension)    Insomnia    a.) takes melatonin   Kidney cysts    Mastalgia 10/06/2012   NSTEMI (non-ST elevated myocardial infarction) (HCC) 11/2000   a.) LHC 12/22/2000 --> 100% mPDA --> no PCI (stents) placed.  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   Takotsubo cardiomyopathy    a.) LHC 11/26/2015: EF 35%; b.) TTE 09/12/2022: EF 60-65%   Vitamin D deficiency    Past Surgical History:  Procedure Laterality Date   ABDOMINAL HYSTERECTOMY  05/26/2002   CARDIAC CATHETERIZATION N/A 11/26/2015   Procedure: Left Heart Cath and Coronary Angiography;  Surgeon: Laurier Nancy, MD;  Location: [facility] INVASIVE CV LAB;  Service: Cardiovascular;  Laterality: N/A;   CERVICAL SPINE SURGERY  05/26/1996   fusion   CLAVICLE SURGERY Right    shaved bone   COLONOSCOPY WITH PROPOFOL N/A 12/23/2016   Procedure: COLONOSCOPY WITH PROPOFOL;  Surgeon: [name], MD;  Location: [facility] ENDOSCOPY;  Service: Endoscopy;  Laterality: N/A;   COLONOSCOPY WITH PROPOFOL N/A 06/26/2017   Procedure: COLONOSCOPY WITH PROPOFOL;  Surgeon: Pasty Spillers, MD;  Location: [facility] ENDOSCOPY;  Service: Gastroenterology;  Laterality: N/A;   CORONARY BALLOON ANGIOPLASTY  11/2000   ESOPHAGOGASTRODUODENOSCOPY (EGD) WITH PROPOFOL N/A 12/23/2016   Procedure: ESOPHAGOGASTRODUODENOSCOPY (EGD) WITH PROPOFOL;  Surgeon: [name], MD;  Location: [facility] ENDOSCOPY;  Service: Endoscopy;  Laterality: N/A;   ESOPHAGOGASTRODUODENOSCOPY (EGD) WITH PROPOFOL N/A 03/31/2017   Procedure: ESOPHAGOGASTRODUODENOSCOPY (EGD) WITH PROPOFOL  WITH GASTRIC MAPPING;  Surgeon: [name], MD;  Location: [facility] ENDOSCOPY;  Service: Gastroenterology;  Laterality: N/A;   ESOPHAGOGASTRODUODENOSCOPY (EGD) WITH PROPOFOL N/A 12/21/2017   Procedure: ESOPHAGOGASTRODUODENOSCOPY (EGD) WITH PROPOFOL;  Surgeon: [name], MD;  Location: [facility] ENDOSCOPY;  Service:  Gastroenterology;  Laterality: N/A;   LEFT HEART CATH AND CORONARY ANGIOGRAPHY Left 04/26/2003   Procedure: LEFT HEART CATH AND CORONARY ANGIOGRAPHY; Location: [facility]; Surgeon: Rudean Hitt, MD   RENAL ANGIOGRAPHY Bilateral 03/02/2017   Procedure: RENAL ANGIOGRAPHY;  Surgeon: Annice Needy, MD;  Location: [facility] INVASIVE CV LAB;  Service: Cardiovascular;  Laterality: Bilateral;   VAGINAL PROLAPSE REPAIR     Family History  Problem Relation Age of Onset   Hypertension Mother    Breast cancer Sister 61   Social History   Tobacco Use   Smoking status: Never   Smokeless tobacco: Never  Vaping Use   Vaping Use: Never used  Substance Use Topics   Alcohol use: Not Currently    Comment: occasional wine   Drug use: No    Pertinent Clinical Results:  LABS:   Lab Results  Component Value Date   WBC 7.8 09/13/2022   HGB 11.5 (L) 09/13/2022   HCT 36.6 09/13/2022   MCV 81.7 09/13/2022   PLT 267 09/13/2022   Lab Results  Component Value Date   NA 137 09/13/2022   K 4.0 09/13/2022   CO2 23 09/13/2022   GLUCOSE 97 09/13/2022   BUN 22 09/13/2022   CREATININE 1.10 (H) 11/12/2022   CALCIUM 9.0 09/13/2022   GFRNONAA >60 09/13/2022    ECG: Date: 10/02/2022 Time ECG obtained: 1347 PM Rate: 59 bpm Rhythm: sinus bradycardia Axis (leads I and aVF): Normal Intervals: PR 158 ms. QRS 78 ms. QTc 405 ms. ST segment and T wave changes: Nonspecific T wave abnormality Comparison: Similar to previous tracing obtained on 09/11/2022   IMAGING / PROCEDURES: CT CORONARY MORPH W/CTA COR W/SCORE W/CA W/CM &/OR WO/CM performed on 11/12/2022 Coronary calcium score of 0 Normal coronary origin with RIGHT dominance No evidence of CAD CAD RADS 0.  Consider nonatherosclerotic causes of chest pain.  TRANSTHORACIC ECHOCARDIOGRAM performed on 09/12/2022 Left ventricular ejection fraction, by estimation, is 60 to 65%. The left ventricle has normal function. The left ventricle has no regional  wall  motion abnormalities. Left ventricular diastolic parameters are  consistent with Grade I diastolic  dysfunction (impaired relaxation).  Right ventricular systolic function is normal. The right ventricular size is normal. There is normal pulmonary artery systolic pressure.  The mitral valve is normal in structure. Mild mitral valve regurgitation. No evidence of mitral stenosis.  The aortic valve is normal in structure. Aortic valve regurgitation is mild. Aortic valve sclerosis/calcification is present, without any evidence of aortic stenosis.  The inferior vena cava is normal in size with greater than 50% respiratory variability, suggesting right atrial pressure of 3 mmHg.   DIAGNOSTIC RADIOGRAPHS OF CHEST 2 VIEWS  performed on 09/11/2022 No pleural effusion or pneumothorax No focal airspace abnormality Normal cardiac and mediastinal contours No radiographically apparent displaced rib fractures Visualized upper abdomen is unremarkable Vertebral body heights are maintained  US ABDOMEN LIMITED RUQ (LIVER/GB) performed on 08/20/2022 Multiple stones in the gallbladder lumen No evidence of gallbladder wall thickening or pericholecystic fluid Common bile duct 2.7 mm Negative sonographic Murphy sign Impression: cholelithiasis without secondary signs of acute cholecystitis  LEFT HEART CATHETERIZATION AND CORONARY ANGIOGRAPHY performed on 11/26/2015 Moderately reduced left ventricular systolic function with an EF of 35% Anteroapical  aneurysm Normal coronary arteries Likely has Takotsubo cardiomyopathy  Impression and Plan:  Jenny Lee has been referred for pre-anesthesia review and clearance prior to her undergoing the planned anesthetic and procedural courses. Available labs, pertinent testing, and imaging results were personally reviewed by me in preparation for upcoming operative/procedural course. [facility] medical record has been updated following extensive record review and patient  interview with PAT staff.   This patient has been appropriately cleared by cardiology with an overall ACCEPTABLE risk of experiencing significant perioperative cardiovascular complications. Based on clinical review performed today (11/14/22), barring any significant acute changes in the patient's overall condition, it is anticipated that she will be able to proceed with the planned surgical intervention. Any acute changes in clinical condition may necessitate her procedure being postponed and/or cancelled. Patient will meet with anesthesia team (MD and/or CRNA) on the day of her procedure for preoperative evaluation/assessment. Questions regarding anesthetic course will be fielded at that time.   Pre-surgical instructions were reviewed with the patient during her PAT appointment, and questions were fielded to satisfaction by PAT clinical staff. She has been instructed on which medications that she will need to hold prior to surgery, as well as the ones that have been deemed safe/appropriate to take on the day of her procedure. As part of the general education provided by PAT, patient made aware both verbally and in writing, that she would need to abstain from the use of any illegal substances during her perioperative course.  She was advised that failure to follow the provided instructions could necessitate case cancellation or result in serious perioperative complications up to and including death. Patient encouraged to contact PAT and/or her surgeon's office to discuss any questions or concerns that may arise prior to surgery; verbalized understanding.   Quentin Mulling, MSN, APRN, FNP-C, CEN [facility]  Peri-operative Services Nurse Practitioner Phone: [phone] Fax: [phone] 11/14/22 10:37 AM  NOTE: This note has been prepared using Dragon dictation software. Despite my best ability to proofread, there is always the potential that unintentional transcriptional errors may  still occur from this process.

## 2022-11-14 NOTE — Telephone Encounter (Signed)
Clearance request came over today for 11/20/22, though pt has an appt with Dr. Kirke Corin on 12/04/22.

## 2022-11-20 ENCOUNTER — Ambulatory Visit: Payer: Medicare HMO | Admitting: Urgent Care

## 2022-11-20 ENCOUNTER — Other Ambulatory Visit: Payer: Self-pay

## 2022-11-20 ENCOUNTER — Encounter
Admission: RE | Disposition: A | Discharge status: Home / Self Care | Payer: Self-pay | Source: Home / Self Care | Attending: Surgery

## 2022-11-20 ENCOUNTER — Encounter: Payer: Self-pay | Admitting: Surgery

## 2022-11-20 ENCOUNTER — Other Ambulatory Visit: Payer: Self-pay | Admitting: Physician Assistant

## 2022-11-20 ENCOUNTER — Ambulatory Visit
Admission: RE | Admit: 2022-11-20 | Discharge: 2022-11-20 | Disposition: A | Discharge status: Home / Self Care | Payer: Medicare HMO | Attending: Surgery | Admitting: Surgery

## 2022-11-20 ENCOUNTER — Telehealth: Payer: Self-pay | Admitting: Surgery

## 2022-11-20 DIAGNOSIS — I10 Essential (primary) hypertension: Secondary | ICD-10-CM | POA: Diagnosis not present

## 2022-11-20 DIAGNOSIS — K8020 Calculus of gallbladder without cholecystitis without obstruction: Secondary | ICD-10-CM | POA: Diagnosis not present

## 2022-11-20 DIAGNOSIS — E785 Hyperlipidemia, unspecified: Secondary | ICD-10-CM | POA: Diagnosis not present

## 2022-11-20 DIAGNOSIS — K8010 Calculus of gallbladder with chronic cholecystitis without obstruction: Secondary | ICD-10-CM | POA: Diagnosis not present

## 2022-11-20 DIAGNOSIS — K219 Gastro-esophageal reflux disease without esophagitis: Secondary | ICD-10-CM | POA: Insufficient documentation

## 2022-11-20 DIAGNOSIS — G4733 Obstructive sleep apnea (adult) (pediatric): Secondary | ICD-10-CM | POA: Insufficient documentation

## 2022-11-20 DIAGNOSIS — G894 Chronic pain syndrome: Secondary | ICD-10-CM | POA: Diagnosis not present

## 2022-11-20 DIAGNOSIS — Z87891 Personal history of nicotine dependence: Secondary | ICD-10-CM | POA: Diagnosis not present

## 2022-11-20 DIAGNOSIS — I2510 Atherosclerotic heart disease of native coronary artery without angina pectoris: Secondary | ICD-10-CM | POA: Insufficient documentation

## 2022-11-20 DIAGNOSIS — F419 Anxiety disorder, unspecified: Secondary | ICD-10-CM | POA: Insufficient documentation

## 2022-11-20 DIAGNOSIS — Z79899 Other long term (current) drug therapy: Secondary | ICD-10-CM | POA: Diagnosis not present

## 2022-11-20 DIAGNOSIS — I252 Old myocardial infarction: Secondary | ICD-10-CM | POA: Diagnosis not present

## 2022-11-20 HISTORY — DX: Chronic pain syndrome: G89.4

## 2022-11-20 HISTORY — DX: Dyspnea, unspecified: R06.00

## 2022-11-20 SURGERY — CHOLECYSTECTOMY, ROBOT-ASSISTED, LAPAROSCOPIC
Anesthesia: General

## 2022-11-20 MED ORDER — INDOCYANINE GREEN 25 MG IV SOLR
INTRAVENOUS | Status: AC
  Filled 2022-11-20: qty 10

## 2022-11-20 MED ORDER — 0.9 % SODIUM CHLORIDE (POUR BTL) OPTIME
TOPICAL | Status: DC | PRN
  Administered 2022-11-20: 500 mL

## 2022-11-20 MED ORDER — ACETAMINOPHEN 10 MG/ML IV SOLN: 1000.0000 mg | Freq: Once | INTRAVENOUS | Status: DC | PRN

## 2022-11-20 MED ORDER — ORAL CARE MOUTH RINSE: 15.0000 mL | Freq: Once | OROMUCOSAL | Status: AC

## 2022-11-20 MED ORDER — EPINEPHRINE PF 1 MG/ML IJ SOLN
INTRAMUSCULAR | Status: AC
  Filled 2022-11-20: qty 1

## 2022-11-20 MED ORDER — GLYCOPYRROLATE 0.2 MG/ML IJ SOLN
INTRAMUSCULAR | Status: DC | PRN
  Administered 2022-11-20: .2 mg via INTRAVENOUS

## 2022-11-20 MED ORDER — OXYCODONE HCL 5 MG/5ML PO SOLN: 5.0000 mg | Freq: Once | ORAL | Status: DC | PRN

## 2022-11-20 MED ORDER — LIDOCAINE HCL (CARDIAC) PF 100 MG/5ML IV SOSY
PREFILLED_SYRINGE | INTRAVENOUS | Status: DC | PRN
  Administered 2022-11-20: 100 mg via INTRAVENOUS

## 2022-11-20 MED ORDER — ACETAMINOPHEN 500 MG PO TABS
1000.0000 mg | ORAL_TABLET | ORAL | Status: AC
  Administered 2022-11-20: 1000 mg via ORAL

## 2022-11-20 MED ORDER — BUPIVACAINE LIPOSOME 1.3 % IJ SUSP
INTRAMUSCULAR | Status: DC | PRN
  Administered 2022-11-20: 10 mL

## 2022-11-20 MED ORDER — DROPERIDOL 2.5 MG/ML IJ SOLN: 0.6250 mg | Freq: Once | INTRAMUSCULAR | Status: DC | PRN

## 2022-11-20 MED ORDER — PROPOFOL 10 MG/ML IV BOLUS
INTRAVENOUS | Status: DC | PRN
  Administered 2022-11-20: 150 mg via INTRAVENOUS

## 2022-11-20 MED ORDER — LIDOCAINE HCL (PF) 2 % IJ SOLN
INTRAMUSCULAR | Status: AC
  Filled 2022-11-20: qty 5

## 2022-11-20 MED ORDER — SUGAMMADEX SODIUM 200 MG/2ML IV SOLN
INTRAVENOUS | Status: DC | PRN
  Administered 2022-11-20: 200 mg via INTRAVENOUS

## 2022-11-20 MED ORDER — SUCCINYLCHOLINE CHLORIDE 200 MG/10ML IV SOSY
PREFILLED_SYRINGE | INTRAVENOUS | Status: AC
  Filled 2022-11-20: qty 10

## 2022-11-20 MED ORDER — CEFAZOLIN SODIUM-DEXTROSE 2-4 GM/100ML-% IV SOLN
2.0000 g | INTRAVENOUS | Status: AC
  Administered 2022-11-20: 2 g via INTRAVENOUS

## 2022-11-20 MED ORDER — CHLORHEXIDINE GLUCONATE 0.12 % MT SOLN
15.0000 mL | Freq: Once | OROMUCOSAL | Status: AC
  Administered 2022-11-20: 15 mL via OROMUCOSAL

## 2022-11-20 MED ORDER — CHLORHEXIDINE GLUCONATE CLOTH 2 % EX PADS: 6.0000 | MEDICATED_PAD | Freq: Once | CUTANEOUS | Status: DC

## 2022-11-20 MED ORDER — FAMOTIDINE 20 MG PO TABS
20.0000 mg | ORAL_TABLET | Freq: Once | ORAL | Status: AC
  Administered 2022-11-20: 20 mg via ORAL

## 2022-11-20 MED ORDER — PROMETHAZINE HCL 25 MG/ML IJ SOLN
INTRAMUSCULAR | Status: AC
  Filled 2022-11-20: qty 1

## 2022-11-20 MED ORDER — ROCURONIUM BROMIDE 10 MG/ML (PF) SYRINGE
PREFILLED_SYRINGE | INTRAVENOUS | Status: AC
  Filled 2022-11-20: qty 10

## 2022-11-20 MED ORDER — PROMETHAZINE HCL 25 MG/ML IJ SOLN
6.2500 mg | INTRAMUSCULAR | Status: DC | PRN
  Administered 2022-11-20: 6.25 mg via INTRAVENOUS

## 2022-11-20 MED ORDER — FAMOTIDINE 20 MG PO TABS
ORAL_TABLET | ORAL | Status: AC
  Filled 2022-11-20: qty 1

## 2022-11-20 MED ORDER — GABAPENTIN 300 MG PO CAPS
300.0000 mg | ORAL_CAPSULE | ORAL | Status: AC
  Administered 2022-11-20: 300 mg via ORAL

## 2022-11-20 MED ORDER — LACTATED RINGERS IV SOLN: INTRAVENOUS | Status: DC

## 2022-11-20 MED ORDER — MIDAZOLAM HCL 2 MG/2ML IJ SOLN
INTRAMUSCULAR | Status: DC | PRN
  Administered 2022-11-20: 2 mg via INTRAVENOUS

## 2022-11-20 MED ORDER — FENTANYL CITRATE (PF) 100 MCG/2ML IJ SOLN: 25.0000 ug | INTRAMUSCULAR | Status: DC | PRN

## 2022-11-20 MED ORDER — GABAPENTIN 300 MG PO CAPS
ORAL_CAPSULE | ORAL | Status: AC
  Filled 2022-11-20: qty 1

## 2022-11-20 MED ORDER — ACETAMINOPHEN 500 MG PO TABS: 1000.0000 mg | ORAL_TABLET | Freq: Four times a day (QID) | ORAL | Status: AC | PRN

## 2022-11-20 MED ORDER — BUPIVACAINE HCL (PF) 0.25 % IJ SOLN
INTRAMUSCULAR | Status: AC
  Filled 2022-11-20: qty 30

## 2022-11-20 MED ORDER — ONDANSETRON HCL 4 MG/2ML IJ SOLN
INTRAMUSCULAR | Status: DC | PRN
  Administered 2022-11-20: 4 mg via INTRAVENOUS

## 2022-11-20 MED ORDER — OXYCODONE HCL 5 MG PO TABS: 5.0000 mg | ORAL_TABLET | ORAL | 0 refills | Status: DC | PRN

## 2022-11-20 MED ORDER — BUPIVACAINE LIPOSOME 1.3 % IJ SUSP: 20.0000 mL | Freq: Once | INTRAMUSCULAR | Status: DC

## 2022-11-20 MED ORDER — ACETAMINOPHEN 500 MG PO TABS
ORAL_TABLET | ORAL | Status: AC
  Filled 2022-11-20: qty 2

## 2022-11-20 MED ORDER — FENTANYL CITRATE (PF) 100 MCG/2ML IJ SOLN
INTRAMUSCULAR | Status: DC | PRN
  Administered 2022-11-20: 50 ug via INTRAVENOUS

## 2022-11-20 MED ORDER — MIDAZOLAM HCL 2 MG/2ML IJ SOLN
INTRAMUSCULAR | Status: AC
  Filled 2022-11-20: qty 2

## 2022-11-20 MED ORDER — BUPIVACAINE LIPOSOME 1.3 % IJ SUSP
INTRAMUSCULAR | Status: AC
  Filled 2022-11-20: qty 10

## 2022-11-20 MED ORDER — OXYCODONE HCL 5 MG PO TABS
5.0000 mg | ORAL_TABLET | ORAL | 0 refills | Status: DC | PRN
Start: 2022-11-20 — End: 2022-11-20

## 2022-11-20 MED ORDER — CEFAZOLIN SODIUM-DEXTROSE 2-4 GM/100ML-% IV SOLN
INTRAVENOUS | Status: AC
  Filled 2022-11-20: qty 100

## 2022-11-20 MED ORDER — ROCURONIUM BROMIDE 100 MG/10ML IV SOLN
INTRAVENOUS | Status: DC | PRN
  Administered 2022-11-20: 50 mg via INTRAVENOUS

## 2022-11-20 MED ORDER — PROPOFOL 10 MG/ML IV BOLUS
INTRAVENOUS | Status: AC
  Filled 2022-11-20: qty 20

## 2022-11-20 MED ORDER — EPHEDRINE 5 MG/ML INJ
INTRAVENOUS | Status: AC
  Filled 2022-11-20: qty 5

## 2022-11-20 MED ORDER — EPHEDRINE SULFATE (PRESSORS) 50 MG/ML IJ SOLN
INTRAMUSCULAR | Status: DC | PRN
  Administered 2022-11-20 (×2): 10 mg via INTRAVENOUS

## 2022-11-20 MED ORDER — OXYCODONE HCL 5 MG PO TABS: 5.0000 mg | ORAL_TABLET | Freq: Once | ORAL | Status: DC | PRN

## 2022-11-20 MED ORDER — BUPIVACAINE-EPINEPHRINE (PF) 0.25% -1:200000 IJ SOLN
INTRAMUSCULAR | Status: DC | PRN
  Administered 2022-11-20: 30 mL

## 2022-11-20 MED ORDER — INDOCYANINE GREEN 25 MG IV SOLR
2.5000 mg | INTRAVENOUS | Status: AC
  Administered 2022-11-20: 2.5 mg via INTRAVENOUS

## 2022-11-20 MED ORDER — DEXAMETHASONE SODIUM PHOSPHATE 10 MG/ML IJ SOLN
INTRAMUSCULAR | Status: DC | PRN
  Administered 2022-11-20: 10 mg via INTRAVENOUS

## 2022-11-20 MED ORDER — PHENYLEPHRINE 80 MCG/ML (10ML) SYRINGE FOR IV PUSH (FOR BLOOD PRESSURE SUPPORT)
PREFILLED_SYRINGE | INTRAVENOUS | Status: AC
  Filled 2022-11-20: qty 30

## 2022-11-20 MED ORDER — FENTANYL CITRATE (PF) 100 MCG/2ML IJ SOLN
INTRAMUSCULAR | Status: AC
  Filled 2022-11-20: qty 2

## 2022-11-20 SURGICAL SUPPLY — 49 items
ADH SKN CLS APL DERMABOND .7 (GAUZE/BANDAGES/DRESSINGS) ×1
BAG PRESSURE INF REUSE 1000 (BAG) IMPLANT
CANNULA CAP OBTURATR AIRSEAL 8 (CAP) IMPLANT
CAUTERY HOOK MNPLR 1.6 DVNC XI (INSTRUMENTS) ×2 IMPLANT
CLIP LIGATING HEMO O LOK GREEN (MISCELLANEOUS) ×2 IMPLANT
DERMABOND ADVANCED .7 DNX12 (GAUZE/BANDAGES/DRESSINGS) ×2 IMPLANT
DRAPE ARM DVNC X/XI (DISPOSABLE) ×8 IMPLANT
DRAPE COLUMN DVNC XI (DISPOSABLE) ×2 IMPLANT
ELECT CAUTERY BLADE TIP 2.5 (TIP) ×1
ELECT REM PT RETURN 9FT ADLT (ELECTROSURGICAL) ×1
ELECTRODE CAUTERY BLDE TIP 2.5 (TIP) ×2 IMPLANT
ELECTRODE REM PT RTRN 9FT ADLT (ELECTROSURGICAL) ×2 IMPLANT
FORCEPS BPLR R/ABLATION 8 DVNC (INSTRUMENTS) ×2 IMPLANT
FORCEPS PROGRASP DVNC XI (FORCEP) ×2 IMPLANT
GLOVE SURG SYN 7.0 (GLOVE) ×2 IMPLANT
GLOVE SURG SYN 7.0 PF PI (GLOVE) ×4 IMPLANT
GLOVE SURG SYN 7.5 E (GLOVE) ×2 IMPLANT
GLOVE SURG SYN 7.5 PF PI (GLOVE) ×4 IMPLANT
GOWN STRL REUS W/ TWL LRG LVL3 (GOWN DISPOSABLE) ×8 IMPLANT
GOWN STRL REUS W/TWL LRG LVL3 (GOWN DISPOSABLE) ×4
IRRIGATOR SUCT 8 DISP DVNC XI (IRRIGATION / IRRIGATOR) IMPLANT
IV NS 1000ML (IV SOLUTION)
IV NS 1000ML BAXH (IV SOLUTION) IMPLANT
KIT PINK PAD W/HEAD ARE REST (MISCELLANEOUS) ×1
KIT PINK PAD W/HEAD ARM REST (MISCELLANEOUS) ×2 IMPLANT
LABEL OR SOLS (LABEL) ×2 IMPLANT
MANIFOLD NEPTUNE II (INSTRUMENTS) ×2 IMPLANT
NDL HYPO 22X1.5 SAFETY MO (MISCELLANEOUS) ×2 IMPLANT
NEEDLE HYPO 22X1.5 SAFETY MO (MISCELLANEOUS) ×1 IMPLANT
NS IRRIG 500ML POUR BTL (IV SOLUTION) ×2 IMPLANT
OBTURATOR OPTICAL STND 8 DVNC (TROCAR) ×1
OBTURATOR OPTICALSTD 8 DVNC (TROCAR) ×2 IMPLANT
PACK LAP CHOLECYSTECTOMY (MISCELLANEOUS) ×2 IMPLANT
PENCIL SMOKE EVACUATOR (MISCELLANEOUS) ×2 IMPLANT
SEAL UNIV 5-12 XI (MISCELLANEOUS) ×8 IMPLANT
SET TUBE FILTERED XL AIRSEAL (SET/KITS/TRAYS/PACK) IMPLANT
SET TUBE SMOKE EVAC HIGH FLOW (TUBING) ×2 IMPLANT
SOL ELECTROSURG ANTI STICK (MISCELLANEOUS) ×1
SOLUTION ELECTROSURG ANTI STCK (MISCELLANEOUS) ×2 IMPLANT
SPIKE FLUID TRANSFER (MISCELLANEOUS) ×2 IMPLANT
SPONGE T-LAP 18X18 ~~LOC~~+RFID (SPONGE) IMPLANT
SPONGE T-LAP 4X18 ~~LOC~~+RFID (SPONGE) ×2 IMPLANT
SUT MNCRL AB 4-0 PS2 18 (SUTURE) ×2 IMPLANT
SUT VIC AB 3-0 SH 27 (SUTURE) ×1
SUT VIC AB 3-0 SH 27X BRD (SUTURE) IMPLANT
SUT VICRYL 0 UR6 27IN ABS (SUTURE) ×4 IMPLANT
SYS BAG RETRIEVAL 10MM (BASKET) ×1
SYSTEM BAG RETRIEVAL 10MM (BASKET) ×2 IMPLANT
WATER STERILE IRR 500ML POUR (IV SOLUTION) ×2 IMPLANT

## 2022-11-20 NOTE — Telephone Encounter (Signed)
Patient had surgery today with Dr. Aleen Campi.  The Rx of Oxycodone, they do not have at the [facility] that they normally use.  Want Rx to [facility] to [facility] on [address], was told that they have it there.  Thank you.

## 2022-11-20 NOTE — Op Note (Signed)
  Procedure Date:  11/20/2022  Pre-operative Diagnosis:  Symptomatic cholelithiasis  Post-operative Diagnosis: Symptomatic cholelithiasis  Procedure:  Robotic assisted cholecystectomy with ICG FireFly cholangiogram  Surgeon:  Howie Ill, MD  Anesthesia:  General endotracheal  Estimated Blood Loss:  15 ml  Specimens:  gallbladder  Complications:  None  Indications for Procedure:  This is a 67 y.o. female who presents with abdominal pain and workup revealing symptomatic cholelithiasis.  The benefits, complications, treatment options, and expected outcomes were discussed with the patient. The risks of bleeding, infection, recurrence of symptoms, failure to resolve symptoms, bile duct damage, bile duct leak, retained common bile duct stone, bowel injury, and need for further procedures were all discussed with the patient and she was willing to proceed.  Description of Procedure: The patient was correctly identified in the preoperative area and brought into the operating room.  The patient was placed supine with VTE prophylaxis in place.  Appropriate time-outs were performed.  Anesthesia was induced and the patient was intubated.  Appropriate antibiotics were infused.  The abdomen was prepped and draped in a sterile fashion. An infraumbilical incision was made. A cutdown technique was used to enter the abdominal cavity without injury, and a 12 mm robotic port was inserted.  Pneumoperitoneum was obtained with appropriate opening pressures.  Three 8-mm ports were placed in the mid abdomen at the level of the umbilicus under direct visualization.  The DaVinci platform was docked, camera targeted, and instruments were placed under direct visualization.  The gallbladder was identified.  The fundus was grasped and retracted cephalad.  Adhesions were lysed bluntly and with electrocautery. The infundibulum was grasped and retracted laterally, exposing the peritoneum overlying the gallbladder.  This  was incised with electrocautery and extended on either side of the gallbladder.  FireFly cholangiogram was then obtained, and we were able to clearly identify the cystic duct and common bile duct.  The cystic duct and cystic artery were carefully dissected with combination of cautery and blunt dissection.  Both were clipped twice proximally and once distally, cutting in between.  The gallbladder was taken from the gallbladder fossa in a retrograde fashion with electrocautery. The gallbladder was placed in an Endocatch bag. The liver bed was inspected and any bleeding was controlled with electrocautery. The right upper quadrant was then inspected again revealing intact clips, no bleeding, and no ductal injury.  The 8 mm ports were removed under direct visualization and the 12 mm port was removed.  The Endocatch bag was brought out via the umbilical incision. The fascial opening was closed using 0 vicryl suture.  Local anesthetic was infused in all incisions and the incisions were closed with 4-0 Monocryl.  The wounds were cleaned and sealed with DermaBond.  The patient was emerged from anesthesia and extubated and brought to the recovery room for further management.  The patient tolerated the procedure well and all counts were correct at the end of the case.   Howie Ill, MD

## 2022-11-20 NOTE — Anesthesia Preprocedure Evaluation (Addendum)
Anesthesia Evaluation  Patient identified by MRN, date of birth, ID band Patient awake    Reviewed: Allergy & Precautions, H&P , NPO status , Patient's Chart, lab work & pertinent test results, reviewed documented beta blocker date and time   Airway Mallampati: III  TM Distance: >3 FB Neck ROM: full    Dental no notable dental hx.    Pulmonary shortness of breath and with exertion, sleep apnea    Pulmonary exam normal        Cardiovascular Exercise Tolerance: Good hypertension, Pt. on medications and Pt. on home beta blockers + CAD (Spontaneous dissection of coronary artery 2002), + Past MI (2002) and + Peripheral Vascular Disease (Renal artery stenosis)  Normal cardiovascular exam  Past takotsubo cardiomyopathy, h/o SCAD of dLAD ECHO TTE 09/12/2022: EF 60-65%, AoV sclerosis, mild MR/AR, G1DD  s/p NSTEMI 11/2000 --> LHC 12/22/2000 --> 100% mPDA --> no PCI (stent) placed; SCAD of dLAD; b.) LHC 04/26/2003: normal coronaries; c.) LHC 11/26/2015: EF 35%, anteroapical aneurysm, normal coronary arteries; d.) cCTA 11/12/2022: Ca2+ score 0   Neuro/Psych  PSYCHIATRIC DISORDERS Anxiety     negative neurological ROS     GI/Hepatic Neg liver ROS,GERD  ,,symptomatic cholelithiasis   Endo/Other  negative endocrine ROS    Renal/GU Renal disease (renal artery stenosis)     Musculoskeletal  (+) Arthritis ,    Abdominal Normal abdominal exam  (+)   Peds  Hematology  (+) Blood dyscrasia, anemia   Anesthesia Other Findings Past Medical History: No date: Anxiety     Comment:  a.) on BZO (clonazepam) PRN No date: Atypical angina 11/2000: CAD (coronary artery disease)     Comment:  a.) s/p NSTEMI 11/2000 --> LHC 12/22/2000 --> 100% mPDA               -->  no PCI (stent) placed; SCAD of dLAD; b.) LHC               04/26/2003: normal coronaries; c.) LHC 11/26/2015: EF               35%, anteroapical aneurysm, normal coronary arteries;  d.)              cCTA 11/12/2022: Ca2+ score 0 No date: Chronic pain syndrome No date: Diastolic dysfunction     Comment:  a.) TTE 09/12/2022: EF 60-65%, AoV sclerosis, mild               MR/AR, G1DD No date: Diverticulosis No date: Dyspnea 08/15/2014: Female genuine stress incontinence No date: GERD (gastroesophageal reflux disease) No date: H. pylori infection 05/21/2020: History of 2019 novel coronavirus disease (COVID-19) No date: HLD (hyperlipidemia) No date: HTN (hypertension) No date: Insomnia     Comment:  a.) takes melatonin No date: Kidney cysts 10/06/2012: Mastalgia 11/2000: NSTEMI (non-ST elevated myocardial infarction) (HCC)     Comment:  a.) LHC 12/22/2000 --> 100% mPDA --> no PCI (stents)               placed.  Procedure complicated by (+) SCAD of dLAD with               resulting ST elevation --> IABP placed and patient               transferred to [facility] No date: Obstructive sleep apnea on CPAP No date: Paroxysmal SVT (supraventricular tachycardia) No date: Prediabetes 08/15/2014: Prolapse of vaginal vault after hysterectomy 12/22/2000: Spontaneous dissection of coronary artery     Comment:  a.) LHC 12/22/2000 --> procedure complicated by (+) SCAD              of the dLAD --> developed ST elevations --> IABP placed               and patient transferred to [facility] --> no further Tx               required. No date: Takotsubo cardiomyopathy     Comment:  a.) LHC 11/26/2015: EF 35%; b.) TTE 09/12/2022: EF               60-65% No date: Vitamin D deficiency  Past Surgical History: 05/26/2002: ABDOMINAL HYSTERECTOMY 11/26/2015: CARDIAC CATHETERIZATION; N/A     Comment:  Procedure: Left Heart Cath and Coronary Angiography;                Surgeon: Laurier Nancy, MD;  Location: [facility] INVASIVE CV               LAB;  Service: Cardiovascular;  Laterality: N/A; 05/26/1996: CERVICAL SPINE SURGERY     Comment:  fusion No date: CLAVICLE SURGERY; Right     Comment:  shaved  bone 12/23/2016: COLONOSCOPY WITH PROPOFOL; N/A     Comment:  Procedure: COLONOSCOPY WITH PROPOFOL;  Surgeon: [name], MD;  Location: [facility] ENDOSCOPY;  Service:               Endoscopy;  Laterality: N/A; 06/26/2017: COLONOSCOPY WITH PROPOFOL; N/A     Comment:  Procedure: COLONOSCOPY WITH PROPOFOL;  Surgeon:               Pasty Spillers, MD;  Location: [facility] ENDOSCOPY;                Service: Gastroenterology;  Laterality: N/A; 11/2000: CORONARY BALLOON ANGIOPLASTY 12/23/2016: ESOPHAGOGASTRODUODENOSCOPY (EGD) WITH PROPOFOL; N/A     Comment:  Procedure: ESOPHAGOGASTRODUODENOSCOPY (EGD) WITH               PROPOFOL;  Surgeon: [name], MD;  Location: [facility]               ENDOSCOPY;  Service: Endoscopy;  Laterality: N/A; 03/31/2017: ESOPHAGOGASTRODUODENOSCOPY (EGD) WITH PROPOFOL; N/A     Comment:  Procedure: ESOPHAGOGASTRODUODENOSCOPY (EGD) WITH               PROPOFOL WITH GASTRIC MAPPING;  Surgeon: [name], MD;              Location: [facility] ENDOSCOPY;  Service: Gastroenterology;                Laterality: N/A; 12/21/2017: ESOPHAGOGASTRODUODENOSCOPY (EGD) WITH PROPOFOL; N/A     Comment:  Procedure: ESOPHAGOGASTRODUODENOSCOPY (EGD) WITH               PROPOFOL;  Surgeon: [name], MD;  Location: [facility]               ENDOSCOPY;  Service: Gastroenterology;  Laterality: N/A; 04/26/2003: LEFT HEART CATH AND CORONARY ANGIOGRAPHY; Left     Comment:  Procedure: LEFT HEART CATH AND CORONARY ANGIOGRAPHY;               Location: [facility]; Surgeon: Rudean Hitt, MD 03/02/2017: RENAL ANGIOGRAPHY; Bilateral     Comment:  Procedure: RENAL ANGIOGRAPHY;  Surgeon: Annice Needy,               MD;  Location: [facility] INVASIVE CV LAB;  Service:               Cardiovascular;  Laterality: Bilateral; No date: VAGINAL PROLAPSE REPAIR  BMI    Body Mass Index: 28.29 kg/m      Reproductive/Obstetrics negative OB ROS                             Anesthesia  Physical Anesthesia Plan  ASA: 3  Anesthesia Plan: General ETT   Post-op Pain Management: Regional block*, Gabapentin PO (pre-op)*, Tylenol PO (pre-op)* and Toradol IV (intra-op)*   Induction:   PONV Risk Score and Plan: 3 and Ondansetron, Dexamethasone and Midazolam  Airway Management Planned: Oral ETT  Additional Equipment:   Intra-op Plan:   Post-operative Plan: Extubation in OR  Informed Consent: I have reviewed the patients History and Physical, chart, labs and discussed the procedure including the risks, benefits and alternatives for the proposed anesthesia with the patient or authorized representative who has indicated his/her understanding and acceptance.     Dental Advisory Given  Plan Discussed with: CRNA and Surgeon  Anesthesia Plan Comments:         Anesthesia Quick Evaluation

## 2022-11-20 NOTE — H&P (Signed)
History of Present Illness: Jenny Lee is a 67 y.o. female presenting for follow-up of symptomatic cholelithiasis.  Patient was last seen on 09/03/2022 for her symptomatic cholelithiasis and she was originally scheduled to have surgery on 09/18/2022.  However she presented a few days before surgery to the emergency room with chest pain and tachycardia.  She had a ZIO monitor placed and has been followed by cardiology at [facility] and at [facility] health.  She has seen cardiology and is cleared for surgery.   The patient reports that from the abdominal standpoint, she continues to have episodes of nausea and discomfort in the right upper quadrant.  Denies anything worsening but the patient's symptoms have remained steady.   Past Medical History:     Past Medical History:  Diagnosis Date   Anxiety      a.) on BZO (clonazepam) PRN   Atypical angina     CAD (coronary artery disease) 11/2000    a.) s/p NSTEMI 11/2000 --> LHC 12/22/2000 --> 100% mPDA --> Tx'd with POBA; no PCI (stent) placed; SCAD of dLAD; b.) LHC 04/26/2003: normal coronaries; c.) LHC 11/26/2015: EF 35%, anteroapical aneurysm, normal coronary arteries   Diastolic dysfunction      a.) TTE 09/12/2022: EF 60-65%, AoV sclerosis, mild MR/AR, G1DD   Diverticulosis     Female genuine stress incontinence 08/15/2014   GERD (gastroesophageal reflux disease)     H. pylori infection     History of 2019 novel coronavirus disease (COVID-19) 05/21/2020   HLD (hyperlipidemia)     HTN (hypertension)     Insomnia      a.) takes melatonin   Kidney cysts     Mastalgia 10/06/2012   NSTEMI (non-ST elevated myocardial infarction) (HCC) 11/2000    a.) LHC/POBA - no PCI (stents) placed.  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   Takotsubo cardiomyopathy      a.) LHC 11/26/2015: EF 35%; b.) TTE 09/12/2022: EF 60-65%   Vitamin D deficiency        Past Surgical History:      Past Surgical History:  Procedure Laterality Date   ABDOMINAL HYSTERECTOMY   05/26/2002   CARDIAC CATHETERIZATION N/A 11/26/2015    Procedure: Left Heart Cath and Coronary Angiography;  Surgeon: Laurier Nancy, MD;  Location: [facility] INVASIVE CV LAB;  Service: Cardiovascular;  Laterality: N/A;   CERVICAL SPINE SURGERY   05/26/1996    fusion   CLAVICLE SURGERY Right      shaved bone   COLONOSCOPY WITH PROPOFOL N/A 12/23/2016    Procedure: COLONOSCOPY WITH PROPOFOL;  Surgeon: [name], MD;  Location: [facility] ENDOSCOPY;  Service: Endoscopy;  Laterality: N/A;   COLONOSCOPY WITH PROPOFOL N/A 06/26/2017    Procedure: COLONOSCOPY WITH PROPOFOL;  Surgeon: Pasty Spillers, MD;  Location: [facility] ENDOSCOPY;  Service: Gastroenterology;  Laterality: N/A;   CORONARY BALLOON ANGIOPLASTY   11/2000   ESOPHAGOGASTRODUODENOSCOPY (EGD) WITH PROPOFOL N/A 12/23/2016    Procedure: ESOPHAGOGASTRODUODENOSCOPY (EGD) WITH PROPOFOL;  Surgeon: [name], MD;  Location: [facility] ENDOSCOPY;  Service: Endoscopy;  Laterality: N/A;   ESOPHAGOGASTRODUODENOSCOPY (EGD) WITH PROPOFOL N/A 03/31/2017    Procedure: ESOPHAGOGASTRODUODENOSCOPY (EGD) WITH PROPOFOL WITH GASTRIC MAPPING;  Surgeon: [name], MD;  Location: [facility] ENDOSCOPY;  Service: Gastroenterology;  Laterality: N/A;   ESOPHAGOGASTRODUODENOSCOPY (EGD) WITH PROPOFOL N/A 12/21/2017    Procedure: ESOPHAGOGASTRODUODENOSCOPY (EGD) WITH PROPOFOL;  Surgeon: [name], MD;  Location: [facility] ENDOSCOPY;  Service: Gastroenterology;  Laterality: N/A;   LEFT HEART CATH AND CORONARY ANGIOGRAPHY  Left 04/26/2003    Procedure: LEFT HEART CATH AND CORONARY ANGIOGRAPHY; Location: [facility]; Surgeon: Rudean Hitt, MD   RENAL ANGIOGRAPHY Bilateral 03/02/2017    Procedure: RENAL ANGIOGRAPHY;  Surgeon: Annice Needy, MD;  Location: [facility] INVASIVE CV LAB;  Service: Cardiovascular;  Laterality: Bilateral;   VAGINAL PROLAPSE REPAIR          Home Medications:        Prior to Admission medications   Medication Sig Start Date End Date Taking? Authorizing Provider  aspirin 81 MG tablet Take 81 mg by mouth daily.     Yes [provider]  celecoxib (CELEBREX) 100 MG capsule Take 100 mg by mouth 2 (two) times daily. 06/25/21   Yes [provider]  cetirizine (ZYRTEC) 10 MG tablet Take 10 mg by mouth daily.     Yes [provider]  diltiazem (CARDIZEM CD) 120 MG 24 hr capsule Take 120 mg by mouth daily. 06/02/22   Yes [provider]  estradiol (ESTRACE) 0.5 MG tablet Take 0.5 mg by mouth daily.     Yes [provider]  furosemide (LASIX) 20 MG tablet Take 10 mg by mouth daily as needed. 01/31/22   Yes [provider]  gabapentin (NEURONTIN) 300 MG capsule Take 900 mg by mouth at bedtime. 03/25/18   Yes [provider]  isosorbide mononitrate (IMDUR) 30 MG 24 hr tablet Take 30 mg by mouth daily. 01/31/21   Yes [provider]  Melatonin 10 MG TABS Take 10 mg by mouth at bedtime.      Yes [provider]  metoprolol succinate (TOPROL-XL) 50 MG 24 hr tablet Take 50 mg by mouth daily. 06/02/22   Yes [provider]  ondansetron (ZOFRAN) 4 MG tablet Take 4 mg by mouth every 8 (eight) hours as needed for nausea.  12/01/16   Yes [provider]  pantoprazole (PROTONIX) 40 MG tablet 40 mg daily. 08/28/17   Yes [provider]  ranolazine (RANEXA) 1000 MG SR tablet Take 1 tablet (1,000 mg total) by mouth 2 (two) times daily. 09/13/22 10/13/22 Yes Masoud, Dahlia Client, MD  rosuvastatin (CRESTOR) 40 MG tablet Take 1 tablet by  mouth daily. 01/18/19   Yes [provider]  sotalol (BETAPACE) 80 MG tablet Take 1 tablet by mouth daily. 01/28/22   Yes [provider]  tiZANidine (ZANAFLEX) 2 MG tablet Take 2 mg by mouth every 8 (eight) hours as needed. 08/19/22   Yes [provider]  Vitamin D, Ergocalciferol, (DRISDOL) 50000 units CAPS capsule Take 50,000 Units by mouth every 7 (seven) days. Saturday 11/06/17   Yes [provider]  clonazePAM (KLONOPIN) 0.5 MG tablet Take 1 tablet (0.5 mg total) by mouth 2 (two) times daily for 5 days. 09/13/22 10/02/22   Baldwin Jamaica, MD      Allergies:     Allergies  Allergen Reactions   No Known Allergies Other (See Comments)      Review of Systems: Review of Systems  Constitutional:  Negative  for chills and fever.  Respiratory:  Negative for shortness of breath.   Cardiovascular:  Negative for chest pain.  Gastrointestinal:  Positive for abdominal pain, nausea and vomiting.  Genitourinary:  Negative for dysuria.  Musculoskeletal:  Negative for myalgias.  Skin:  Negative for rash.      Physical Exam BP 131/79   Pulse 77   Temp 97.9 F (36.6 C) (Oral)   Ht 5\' 5"  (1.651 m)   Wt 167 lb 3.2 oz (75.8 kg)   SpO2 97%   BMI 27.82 kg/m  CONSTITUTIONAL: No acute distress, well-nourished HEENT:  Normocephalic, atraumatic, extraocular motion intact. RESPIRATORY:  Lungs are clear, and breath sounds are equal bilaterally. Normal respiratory effort without pathologic use of accessory muscles. CARDIOVASCULAR: Heart is regular without murmurs, gallops, or rubs. GI: The abdomen is soft, nondistended, with some discomfort in the right upper quadrant.  Negative Murphy sign.  NEUROLOGIC:  Motor and sensation is grossly normal.  Cranial nerves are grossly intact. PSYCH:  Alert and oriented to person, place and time. Affect is normal.   Labs/Imaging: Labs from 09/13/2022: Sodium 137, potassium 4.0, chloride 104, CO2 23, BUN 22, creatinine 0.98.  WBC 7.8,  hemoglobin 11.5, hematocrit 36.6, platelets 267.   Ultrasound RUQ on 08/20/2022: FINDINGS: Gallbladder: Multiple stones in the gallbladder lumen. No gallbladder wall thickening or pericholecystic fluid. Negative sonographic Murphy's sign.   Common bile duct: Diameter: 2.7 mm   Liver: No focal lesion identified. Within normal limits in parenchymal echogenicity. Portal vein is patent on color Doppler imaging with normal direction of blood flow towards the liver.   Other: None.   IMPRESSION: Cholelithiasis without secondary signs of acute cholecystitis.   Assessment and Plan: This is a 67 y.o. female with symptomatic cholelithiasis.   - The patient surgery was originally scheduled for 09/18/2022 but this was canceled due to tachycardia and chest pain.  She has been evaluated by cardiology and after workup, she has been cleared for surgery. - Discussed again with her the plan for robotic assisted cholecystectomy and reviewed again the surgery with her including the planned incisions, risks of bleeding, infection, injury to surrounding structures, that this would be an outpatient procedure, the use of ICG for better evaluation of the biliary anatomy, operative activity restrictions, pain control, and she is willing to proceed. - All of her questions have been answered     Howie Ill, MD [facility]

## 2022-11-20 NOTE — Anesthesia Postprocedure Evaluation (Signed)
Anesthesia Post Note  Patient: Jenny Lee  Procedure(s) Performed: XI ROBOTIC ASSISTED LAPAROSCOPIC CHOLECYSTECTOMY INDOCYANINE GREEN FLUORESCENCE IMAGING (ICG)  Patient location during evaluation: Endoscopy Anesthesia Type: General Level of consciousness: awake and alert Pain management: pain level controlled Vital Signs Assessment: post-procedure vital signs reviewed and stable Respiratory status: spontaneous breathing, nonlabored ventilation and respiratory function stable Cardiovascular status: blood pressure returned to baseline and stable Postop Assessment: no apparent nausea or vomiting Anesthetic complications: no   No notable events documented.   Last Vitals:  Vitals:   11/20/22 0958 11/20/22 1010  BP: 115/69 125/69  Pulse: (!) 58 (!) 56  Resp: 17 15  Temp: (!) 36.4 C (!) 36.3 C  SpO2: 96% 100%    Last Pain:  Vitals:   11/20/22 1010  TempSrc: Temporal  PainSc: 0-No pain                 Foye Deer

## 2022-11-20 NOTE — Transfer of Care (Addendum)
Immediate Anesthesia Transfer of Care Note  Patient: Jenny Lee  Procedure(s) Performed: XI ROBOTIC ASSISTED LAPAROSCOPIC CHOLECYSTECTOMY INDOCYANINE GREEN FLUORESCENCE IMAGING (ICG)  Patient Location: PACU  Anesthesia Type:General  Level of Consciousness: sedated  Airway & Oxygen Therapy: Patient Spontanous Breathing and Patient connected to face mask  Post-op Assessment: Report given to RN and Post -op Vital signs reviewed and stable  Post vital signs: Reviewed and stable  Last Vitals:  Vitals Value Taken Time  BP 143/75 11/20/22 0928  Temp 36.2 C 11/20/22 0928  Pulse 60 11/20/22 0930  Resp 13 11/20/22 0930  SpO2 100 % 11/20/22 0930  Vitals shown include unvalidated device data.  Last Pain:  Vitals:   11/20/22 0629  TempSrc: Oral  PainSc: 0-No pain         Complications: No notable events documented.

## 2022-11-20 NOTE — Anesthesia Procedure Notes (Signed)
Procedure Name: Intubation Date/Time: 11/20/2022 7:51 AM  Performed by: Danelle Berry, CRNAPre-anesthesia Checklist: Patient identified, Emergency Drugs available, Suction available and Patient being monitored Patient Re-evaluated:Patient Re-evaluated prior to induction Oxygen Delivery Method: Circle system utilized Preoxygenation: Pre-oxygenation with 100% oxygen Induction Type: IV induction Ventilation: Mask ventilation without difficulty Laryngoscope Size: McGraph and 3 Grade View: Grade II Tube type: Oral Tube size: 7.0 mm Number of attempts: 1 Airway Equipment and Method: Stylet and Oral airway Placement Confirmation: ETT inserted through vocal cords under direct vision, positive ETCO2 and breath sounds checked- equal and bilateral Secured at: 21 cm Tube secured with: Tape Dental Injury: Teeth and Oropharynx as per pre-operative assessment

## 2022-11-20 NOTE — Discharge Instructions (Addendum)
Discharge Instructions: 1.  Patient may shower, but do not scrub wounds heavily and dab dry only. 2.  Do not submerge wounds in pool/tub until fully healed. 3.  Do not apply ointments or hydrogen peroxide to the wounds. 4.  May apply ice packs to the wounds for comfort. 5.  May resume your Aspirin on 11/22/22. 6.  May take your Celebrex twice daily as needed for pain. 7.  No heavy lifting or pushing of more than 10-15 lbs for 4 weeks. 8.  Do not drive while taking narcotics for pain control.  Prior to driving, make sure you are able to rotate right and left to look at blindspots without significant pain or discomfort.   AMBULATORY SURGERY  DISCHARGE INSTRUCTIONS   The drugs that you were given will stay in your system until tomorrow so for the next 24 hours you should not:  Drive an automobile Make any legal decisions Drink any alcoholic beverage   You may resume regular meals tomorrow.  Today it is better to start with liquids and gradually work up to solid foods.  You may eat anything you prefer, but it is better to start with liquids, then soup and crackers, and gradually work up to solid foods.   Please notify your doctor immediately if you have any unusual bleeding, trouble breathing, redness and pain at the surgery site, drainage, fever, or pain not relieved by medication.      Additional Instructions:

## 2022-12-04 ENCOUNTER — Ambulatory Visit: Payer: Medicare HMO | Attending: Cardiovascular Disease | Admitting: Cardiovascular Disease

## 2022-12-04 ENCOUNTER — Ambulatory Visit (INDEPENDENT_AMBULATORY_CARE_PROVIDER_SITE_OTHER): Payer: Medicare HMO | Admitting: Physician Assistant

## 2022-12-04 ENCOUNTER — Ambulatory Visit: Payer: Medicare HMO | Admitting: Cardiovascular Disease

## 2022-12-04 ENCOUNTER — Encounter: Payer: Self-pay | Admitting: Cardiovascular Disease

## 2022-12-04 ENCOUNTER — Encounter: Payer: Self-pay | Admitting: Physician Assistant

## 2022-12-04 VITALS — BP 133/78 | HR 76 | Temp 98.0°F | Ht 65.0 in | Wt 167.0 lb

## 2022-12-04 VITALS — BP 128/80 | HR 63 | Ht 65.0 in | Wt 167.5 lb

## 2022-12-04 DIAGNOSIS — E785 Hyperlipidemia, unspecified: Secondary | ICD-10-CM

## 2022-12-04 DIAGNOSIS — I10 Essential (primary) hypertension: Secondary | ICD-10-CM

## 2022-12-04 DIAGNOSIS — I25118 Atherosclerotic heart disease of native coronary artery with other forms of angina pectoris: Secondary | ICD-10-CM

## 2022-12-04 DIAGNOSIS — K8020 Calculus of gallbladder without cholecystitis without obstruction: Secondary | ICD-10-CM

## 2022-12-04 DIAGNOSIS — Z8679 Personal history of other diseases of the circulatory system: Secondary | ICD-10-CM

## 2022-12-04 DIAGNOSIS — I479 Paroxysmal tachycardia, unspecified: Secondary | ICD-10-CM

## 2022-12-04 DIAGNOSIS — Z09 Encounter for follow-up examination after completed treatment for conditions other than malignant neoplasm: Secondary | ICD-10-CM

## 2022-12-04 NOTE — Progress Notes (Signed)
Cardiology Office Note   Date:  12/05/2022   ID:  Jenny Lee, DOB [birth year], MRN [id number]  PCP:  Sherrie Mustache, MD  Cardiologist:   Lorine Bears, MD   Chief Complaint  Patient presents with   Follow-up    2 month f/u CT c/o Chest pain since gall bladder removal, nauseous and fatigue. Meds reviewed verbally with pt.      History of Present Illness: Jenny Lee is a 67 y.o. female who is here today for follow-up visit regarding coronary artery disease.    She has known history of essential hypertension, hyperlipidemia and obstructive sleep apnea. Her initial presentation was in 2002.  She was 67 years old at that time and presented with non-ST elevation myocardial infarction.  Echocardiogram showed normal LV systolic function with apical wall motion abnormality. She underwent cardiac catheterization by Dr. Juliann Pares which showed occluded right PDA with an appearance highly suggestive of scad.  In addition, there was likely scad in the mid to distal LAD with apical wall motion abnormality and mild to moderately reduced LV systolic function.  The patient had an aortic balloon pump placement and was transferred to [facility] at that time where she was managed medically without revascularization. She underwent repeat cardiac catheterization by Dr. Welton Flakes in 2017 which showed completely normal coronary arteries with recanalization of the right PDA and normal appearance of the LAD.  There was persistent wall motion abnormality in the inferior apical region.  She has been most recently followed at [facility] cardiology for paroxysmal supraventricular tachycardia which was treated with sotalol. She was hospitalized in April at [facility] with atypical chest pain.  In addition, she had an episode of tachycardia with a heart rate of 140 bpm before her presentation.  Troponin was minimally elevated.  Echocardiogram showed normal LV systolic function with no significant valvular abnormalities.  She  underwent cardiac CTA in June which showed normal coronary arteries with no evidence of obstructive disease and a calcium score of 0.  She underwent cholecystectomy on June 27 without complications.  She has been doing reasonably well but does complain of fatigue and nausea.  No chest pain.   Past Medical History:  Diagnosis Date   Anxiety    a.) on BZO (clonazepam) PRN   Atypical angina    CAD (coronary artery disease) 11/2000   a.) s/p NSTEMI 11/2000 --> LHC 12/22/2000 --> 100% mPDA -->  no PCI (stent) placed; SCAD of dLAD; b.) LHC 04/26/2003: normal coronaries; c.) LHC 11/26/2015: EF 35%, anteroapical aneurysm, normal coronary arteries; d.) cCTA 11/12/2022: Ca2+ score 0   Chronic pain syndrome    Diastolic dysfunction    a.) TTE 09/12/2022: EF 60-65%, AoV sclerosis, mild MR/AR, G1DD   Diverticulosis    Dyspnea    Female genuine stress incontinence 08/15/2014   GERD (gastroesophageal reflux disease)    H. pylori infection    History of 2019 novel coronavirus disease (COVID-19) 05/21/2020   HLD (hyperlipidemia)    HTN (hypertension)    Insomnia    a.) takes melatonin   Kidney cysts    Mastalgia 10/06/2012   NSTEMI (non-ST elevated myocardial infarction) (HCC) 11/2000   a.) LHC 12/22/2000 --> 100% mPDA --> no PCI (stents) placed.  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   Takotsubo cardiomyopathy    a.) LHC 11/26/2015: EF 35%; b.) TTE 09/12/2022: EF 60-65%   Vitamin D deficiency     Past Surgical History:  Procedure  Laterality Date   ABDOMINAL HYSTERECTOMY  05/26/2002   CARDIAC CATHETERIZATION N/A 11/26/2015   Procedure: Left Heart Cath and Coronary Angiography;  Surgeon: Laurier Nancy, MD;  Location: [facility] INVASIVE CV LAB;  Service: Cardiovascular;  Laterality: N/A;   CERVICAL SPINE SURGERY  05/26/1996   fusion   CLAVICLE SURGERY Right    shaved bone   COLONOSCOPY WITH PROPOFOL N/A 12/23/2016   Procedure: COLONOSCOPY WITH PROPOFOL;  Surgeon: [name], MD;  Location: [facility] ENDOSCOPY;  Service: Endoscopy;  Laterality: N/A;   COLONOSCOPY WITH PROPOFOL N/A 06/26/2017   Procedure: COLONOSCOPY WITH PROPOFOL;  Surgeon: Pasty Spillers, MD;  Location: [facility] ENDOSCOPY;  Service: Gastroenterology;  Laterality: N/A;   CORONARY BALLOON ANGIOPLASTY  11/2000   ESOPHAGOGASTRODUODENOSCOPY (EGD) WITH PROPOFOL N/A 12/23/2016   Procedure: ESOPHAGOGASTRODUODENOSCOPY (EGD) WITH PROPOFOL;  Surgeon: [name], MD;  Location: [facility] ENDOSCOPY;  Service: Endoscopy;  Laterality: N/A;   ESOPHAGOGASTRODUODENOSCOPY (EGD) WITH PROPOFOL N/A 03/31/2017   Procedure: ESOPHAGOGASTRODUODENOSCOPY (EGD) WITH PROPOFOL WITH GASTRIC MAPPING;  Surgeon: [name], MD;  Location: [facility] ENDOSCOPY;  Service: Gastroenterology;  Laterality: N/A;   ESOPHAGOGASTRODUODENOSCOPY (EGD) WITH PROPOFOL N/A 12/21/2017   Procedure: ESOPHAGOGASTRODUODENOSCOPY (EGD) WITH PROPOFOL;  Surgeon: [name], MD;  Location: [facility] ENDOSCOPY;  Service: Gastroenterology;  Laterality: N/A;   LEFT HEART CATH AND CORONARY ANGIOGRAPHY Left 04/26/2003   Procedure: LEFT HEART CATH AND CORONARY ANGIOGRAPHY; Location: [facility]; Surgeon: Rudean Hitt, MD   RENAL ANGIOGRAPHY Bilateral 03/02/2017   Procedure: RENAL ANGIOGRAPHY;  Surgeon: Annice Needy, MD;  Location: [facility] INVASIVE CV LAB;  Service: Cardiovascular;  Laterality: Bilateral;   VAGINAL PROLAPSE REPAIR       Current Outpatient Medications  Medication Sig Dispense Refill   acetaminophen (TYLENOL) 500 MG tablet  Take 2 tablets (1,000 mg total) by mouth every 6 (six) hours as needed for mild pain.     aspirin 81 MG tablet Take 81 mg by mouth daily.     celecoxib (CELEBREX) 100 MG capsule Take 100 mg by mouth 2 (two) times daily.     cetirizine (ZYRTEC) 10 MG tablet Take 10 mg by mouth daily.     clonazePAM (KLONOPIN) 0.5 MG tablet Take 1 tablet (0.5 mg total) by mouth 2 (two) times daily for 5 days. (Patient taking differently: Take 0.5 mg by mouth daily.) 10 tablet 0   diltiazem (CARDIZEM CD) 120 MG 24 hr capsule Take 120 mg by mouth daily.     DULoxetine (CYMBALTA) 30 MG capsule Take 30 mg by mouth daily.     estradiol (ESTRACE) 0.5 MG tablet Take 0.5 mg by mouth daily.     furosemide (LASIX) 20 MG tablet Take 10 mg by mouth daily as needed.     gabapentin (NEURONTIN) 300 MG capsule Take by mouth. Take 1 tablet am and 3 tablets pm daily.     isosorbide mononitrate (IMDUR) 30 MG 24 hr tablet Take 30 mg by mouth daily.     Melatonin 10 MG TABS Take 10 mg by mouth at bedtime.      metoprolol succinate (TOPROL-XL) 50 MG  24 hr tablet Take 50 mg by mouth daily.     ondansetron (ZOFRAN) 4 MG tablet Take 1 tablet (4 mg total) by mouth every 8 (eight) hours as needed for nausea. 20 tablet 0   pantoprazole (PROTONIX) 40 MG tablet 40 mg as needed.  2   rosuvastatin (CRESTOR) 40 MG tablet Take 1 tablet by mouth daily.     sotalol (BETAPACE) 80 MG tablet Take 1 tablet by mouth daily.     tiZANidine (ZANAFLEX) 2 MG tablet Take 2 mg by mouth every 8 (eight) hours as needed.     Vitamin D, Ergocalciferol, (DRISDOL) 50000 units CAPS capsule Take 50,000 Units by mouth every 7 (seven) days. Saturday  1   ranolazine (RANEXA) 1000 MG SR tablet Take 1 tablet (1,000 mg total) by mouth 2 (two) times daily. 60 tablet 0   No current facility-administered medications for this visit.    Allergies:   No known allergies    Social History:  The patient  reports that she has never smoked. She has never used smokeless tobacco.  She reports that she does not currently use alcohol. She reports that she does not use drugs.   Family History:  The patient's family history includes Breast cancer (age of onset: 33) in her sister; Hypertension in her mother.    ROS:  Please see the history of present illness.   Otherwise, review of systems are positive for none.   All other systems are reviewed and negative.    PHYSICAL EXAM: VS:  BP 128/80 (BP Location: Right Arm, Patient Position: Sitting, Cuff Size: Normal)   Pulse 63   Ht 5\' 5"  (1.651 m)   Wt 167 lb 8 oz (76 kg)   SpO2 98%   BMI 27.87 kg/m  , BMI Body mass index is 27.87 kg/m. GEN: Well nourished, well developed, in no acute distress  HEENT: normal  Neck: no JVD, carotid bruits, or masses Cardiac: RRR; no  rubs, or gallops,no edema .  1 out of 6 systolic murmur in the aortic area. Respiratory:  clear to auscultation bilaterally, normal work of breathing GI: soft, nontender, nondistended, + BS MS: no deformity or atrophy  Skin: warm and dry, no rash Neuro:  Strength and sensation are intact Psych: euthymic mood, full affect   EKG:  EKG is ordered today. The ekg ordered today demonstrates: Normal sinus rhythm Cannot rule out Anterior infarct , age undetermined When compared with ECG of 11-Sep-2022 14:50, No significant change was found    Recent Labs: 02/06/2022: ALT 14 09/11/2022: B Natriuretic Peptide 80.5 09/13/2022: BUN 22; Hemoglobin 11.5; Magnesium 2.3; Platelets 267; Potassium 4.0; Sodium 137 11/12/2022: Creatinine, Ser 1.10    Lipid Panel    Component Value Date/Time   CHOL 155 09/12/2022 1203   TRIG 52 09/12/2022 1203   HDL 54 09/12/2022 1203   CHOLHDL 2.9 09/12/2022 1203   VLDL 10 09/12/2022 1203   LDLCALC 91 09/12/2022 1203      Wt Readings from Last 3 Encounters:  12/04/22 167 lb (75.8 kg)  12/04/22 167 lb 8 oz (76 kg)  11/20/22 170 lb (77.1 kg)         10/02/2022    1:58 PM 10/02/2022    1:42 PM  PAD Screen  Previous PAD  dx? No No  Previous surgical procedure? No No  Pain with walking? No No  Feet/toe relief with dangling? No No  Painful, non-healing ulcers? No No  Extremities discolored? No No  ASSESSMENT AND PLAN:  1.  History of SCAD: Recent cardiac CTA showed normal coronary arteries with no obstructive disease.  She is known to have angina and she is on multiple antianginal medications including Imdur, Ranexa, Toprol and diltiazem.  2.  Paroxysmal supraventricular tachycardia: Her symptoms are reasonably controlled on sotalol, Toprol and diltiazem .   3.  Essential hypertension: Blood pressure is controlled on current medications.  4.  Hyperlipidemia: Currently on rosuvastatin.    Disposition:   FU with me in 6 months  Signed,  Lorine Bears, MD  12/05/2022 10:29 AM    [facility]

## 2022-12-04 NOTE — Progress Notes (Signed)
[facility] POST-OP OFFICE VISIT  12/04/2022  HPI: Jenny Lee is a 67 y.o. female 14 days s/p robotic assisted laparoscopic cholecystectomy for symptomatic cholelithiasis   She reports that overall she has done well. She does continue to complain of waxing and waning central chest and upper abdominal soreness. She denied any associated SOB, diaphoresis, or dizziness. She did have cardiac CT on 06/24 which was reassuring. She states "I don't think its my heart, I just saw my cardiologist." She did have an appointment earlier today and notes are still pending.  From abdominal standpoint, she does not complain of significant abdominal pain. No fever, chills, or emesis. She does report intermittent nausea. No juandice or alcoholic stools. She is able to tolerate PO. Bowel function normal. Incisions ae healing well. No other complaints.   Vital signs: BP 133/78   Pulse 76   Temp 98 F (36.7 C)   Ht 5\' 5"  (1.651 m)   Wt 167 lb (75.8 kg)   SpO2 98%   BMI 27.79 kg/m    Physical Exam: Constitutional: Well appearing female, NAD Abdomen: Soft, non-tender, non-distended, no rebound/guarding Skin: Laparoscopic incisions are healing well, no erythema or drainage   Assessment/Plan: This is a 67 y.o. female 14 days s/p robotic assisted laparoscopic cholecystectomy for symptomatic cholelithiasis    - In regards to her chest discomfort, unclear etiology. Of course cardiopulmonary etiology is first concern but she has normal cardiac CT on 06/24 and appointment earlier today with cardiology was apparently reassuring. There may be a component of reflux potentially. Maybe residual inflammation from cholecystectomy as well. I do believe nausea is secondary to cholecystectomy and her body adjusting to this. I do no think there is any evidence of post-operative infection, abscess, retained CBD stone, or biliary injury. Encourage her that these GI symptoms tend to be self limiting after  cholecystectomy. She was encouraged to call in a few weeks if anything worsens or does not resolve.   -- Lynden Oxford, PA-C [facility] 12/04/2022, 2:08 PM M-F: 7am - 4pm

## 2022-12-04 NOTE — Patient Instructions (Signed)

## 2022-12-04 NOTE — Patient Instructions (Signed)
Medication Instructions:  Your physician recommends that you continue on your current medications as directed. Please refer to the Current Medication list given to you today.  *If you need a refill on your cardiac medications before your next appointment, please call your pharmacy*   Lab Work: None ordered today   Testing/Procedures: None ordered today   Follow-Up: At [facility], you and your health needs are our priority.  As part of our continuing mission to provide you with exceptional heart care, we have created designated Provider Care Teams.  These Care Teams include your primary Cardiologist (physician) and Advanced Practice Providers (APPs -  Physician Assistants and Nurse Practitioners) who all work together to provide you with the care you need, when you need it.  We recommend signing up for the patient portal called "MyChart".  Sign up information is provided on this After Visit Summary.  MyChart is used to connect with patients for Virtual Visits (Telemedicine).  Patients are able to view lab/test results, encounter notes, upcoming appointments, etc.  Non-urgent messages can be sent to your provider as well.   To learn more about what you can do with MyChart, go to [email or web address].    Your next appointment:   6 months   Provider:   You may see Lorine Bears, MD or one of the following Advanced Practice Providers on your designated Care Team:   Nicolasa Ducking, NP Eula Listen, PA-C Cadence Fransico Michael, PA-C Charlsie Quest, NP

## 2022-12-19 ENCOUNTER — Other Ambulatory Visit: Payer: Self-pay

## 2022-12-19 ENCOUNTER — Emergency Department
Admission: EM | Admit: 2022-12-19 | Discharge: 2022-12-19 | Disposition: A | Discharge status: Home / Self Care | Payer: Medicare HMO | Attending: Emergency Medicine | Admitting: Emergency Medicine

## 2022-12-19 ENCOUNTER — Emergency Department: Payer: Medicare HMO

## 2022-12-19 DIAGNOSIS — R42 Dizziness and giddiness: Secondary | ICD-10-CM

## 2022-12-19 DIAGNOSIS — I10 Essential (primary) hypertension: Secondary | ICD-10-CM | POA: Diagnosis not present

## 2022-12-19 DIAGNOSIS — R079 Chest pain, unspecified: Secondary | ICD-10-CM

## 2022-12-19 LAB — CBC
HCT: 36.7 % (ref 36.0–46.0)
Hemoglobin: 12.4 g/dL (ref 12.0–15.0)
MCH: 27.6 pg (ref 26.0–34.0)
MCHC: 33.8 g/dL (ref 30.0–36.0)
MCV: 81.7 fL (ref 80.0–100.0)
Platelets: 240 10*3/uL (ref 150–400)
RBC: 4.49 MIL/uL (ref 3.87–5.11)
RDW: 14.6 % (ref 11.5–15.5)
WBC: 7.5 10*3/uL (ref 4.0–10.5)
nRBC: 0 % (ref 0.0–0.2)

## 2022-12-19 LAB — URINALYSIS, ROUTINE W REFLEX MICROSCOPIC
Bilirubin Urine: NEGATIVE
Glucose, UA: NEGATIVE mg/dL
Hgb urine dipstick: NEGATIVE
Ketones, ur: NEGATIVE mg/dL
Leukocytes,Ua: NEGATIVE
Nitrite: NEGATIVE
Protein, ur: NEGATIVE mg/dL
Specific Gravity, Urine: 1.008 (ref 1.005–1.030)
pH: 6 (ref 5.0–8.0)

## 2022-12-19 LAB — BASIC METABOLIC PANEL
Anion gap: 9 (ref 5–15)
BUN: 18 mg/dL (ref 8–23)
CO2: 24 mmol/L (ref 22–32)
Calcium: 9.2 mg/dL (ref 8.9–10.3)
Chloride: 100 mmol/L (ref 98–111)
Creatinine, Ser: 0.92 mg/dL (ref 0.44–1.00)
GFR, Estimated: >60 mL/min (ref >60)
Glucose, Bld: 126 mg/dL — ABNORMAL HIGH (ref 70–99)
Potassium: 4.4 mmol/L (ref 3.5–5.1)
Sodium: 133 mmol/L — ABNORMAL LOW (ref 135–145)

## 2022-12-19 LAB — LIPASE, BLOOD: Lipase: 39 U/L (ref 11–51)

## 2022-12-19 LAB — TROPONIN I (HIGH SENSITIVITY)
Troponin I (High Sensitivity): 4 ng/L (ref <18)
Troponin I (High Sensitivity): 6 ng/L (ref <18)

## 2022-12-19 MED ORDER — ASPIRIN 81 MG PO CHEW
324.0000 mg | CHEWABLE_TABLET | Freq: Once | ORAL | Status: AC
  Administered 2022-12-19: 324 mg via ORAL
  Filled 2022-12-19: qty 4

## 2022-12-19 MED ORDER — NITROGLYCERIN 0.4 MG SL SUBL: 0.4000 mg | SUBLINGUAL_TABLET | SUBLINGUAL | Status: DC | PRN

## 2022-12-19 NOTE — Discharge Instructions (Signed)
Please seek medical attention for any high fevers, chest pain, shortness of breath, change in behavior, persistent vomiting, bloody stool or any other new or concerning symptoms.  

## 2022-12-19 NOTE — ED Provider Notes (Signed)
[facility] Provider Note    Event Date/Time   First MD Initiated Contact with Patient 12/19/22 1511     (approximate)   History   Chest pain   HPI  Jenny Lee is a 67 y.o. female  who presents to the emergency department today with primary concern for chest pain. She says that it started two days ago. Located in the center part of her chest. Has been fairly constant. Has history of heart disease and this somewhat reminds her of when she was seen and admitted a few months ago for atypical angina. The patient has secondary complaint of lightheadedness and high blood pressure. These symptoms started this morning shortly after waking up. Felt lightheaded and checked her blood pressure and it was noted to be high. Says that she does get lightheaded when her blood pressure goes high.       Physical Exam   Triage Vital Signs: ED Triage Vitals [12/19/22 1420]  Encounter Vitals Group     BP (!) 167/73     Systolic BP Percentile      Diastolic BP Percentile      Pulse Rate 74     Resp 18     Temp 97.6 F (36.4 C)     Temp Source Oral     SpO2 100 %     Weight 167 lb 1.7 oz (75.8 kg)     Height 5\' 5"  (1.651 m)     Head Circumference      Peak Flow      Pain Score 7     Pain Loc      Pain Education      Exclude from Growth Chart     Most recent vital signs: Vitals:   12/19/22 1420  BP: (!) 167/73  Pulse: 74  Resp: 18  Temp: 97.6 F (36.4 C)  SpO2: 100%   General: Awake, alert, oriented. CV:  Good peripheral perfusion. Regular rate and rhythm. Resp:  Normal effort. Lungs clear. Abd:  No distention.    ED Results / Procedures / Treatments   Labs (all labs ordered are listed, but only abnormal results are displayed) Labs Reviewed  URINALYSIS, ROUTINE W REFLEX MICROSCOPIC - Abnormal; Notable for the following components:      Result Value   Color, Urine STRAW (*)    APPearance CLEAR (*)    All other components within normal limits   CBC  BASIC METABOLIC PANEL  LIPASE, BLOOD  CBG MONITORING, ED     EKG  I, Phineas Semen, attending physician, personally viewed and interpreted this EKG  EKG Time: 1429 Rate: 61 Rhythm: sinus rhythm Axis: normal Intervals: qtc 398 QRS: narrow ST changes: no st elevation, t wave inversion III, V4 Impression: abnormal ekg   RADIOLOGY I independently interpreted and visualized the CT head. My interpretation: No bleed Radiology interpretation:  IMPRESSION:  No acute intracranial abnormality noted.   I independently interpreted and visualized the CXR. My interpretation: No pneumonia Radiology interpretation:  IMPRESSION:  No active cardiopulmonary disease.      PROCEDURES:  Critical Care performed: No   MEDICATIONS ORDERED IN ED: Medications - No data to display   IMPRESSION / MDM / ASSESSMENT AND PLAN / ED COURSE  I reviewed the triage vital signs and the nursing notes.                              Differential  diagnosis includes, but is not limited to, ACS, pneumonia, PE, anxiety, dissection, esophagitis  Patient's presentation is most consistent with acute presentation with potential threat to life or bodily function.   The patient is on the cardiac monitor to evaluate for evidence of arrhythmia and/or significant heart rate changes.  Patient presented to the emergency department today with primary concern for chest pain.  Has been present for 2 days.  Secondary complaint of lightheadedness and high blood pressure.  On exam patient's blood pressure while high not concerning only so at this time.  EKG without obvious ST elevation.  Initial blood work without any concerning elevation of troponin.  Given history of coronary disease we will repeat troponin.  Additionally will give patient nitroglycerin to see if that helps her symptoms.  Patient repeat troponin was negative.  Patient continued to feel improved.  At this time will plan on discharging home.       FINAL CLINICAL IMPRESSION(S) / ED DIAGNOSES   Final diagnoses:  Lightheadedness  Hypertension, unspecified type  Chest pain, unspecified type     Note:  This document was prepared using Dragon voice recognition software and may include unintentional dictation errors.    Phineas Semen, MD 12/19/22 [phone]

## 2022-12-19 NOTE — ED Triage Notes (Signed)
Pt here with dizziness and hypertension since last night. Pt states she stood up this morning and had to sit back down to avoid falling. Pt states she has a strong pain in her chest that she describes as pressure that is constant.

## 2022-12-24 ENCOUNTER — Other Ambulatory Visit: Payer: Self-pay | Admitting: Nephrology

## 2022-12-24 DIAGNOSIS — Q6102 Congenital multiple renal cysts: Secondary | ICD-10-CM

## 2022-12-31 ENCOUNTER — Ambulatory Visit
Admission: RE | Admit: 2022-12-31 | Discharge: 2022-12-31 | Disposition: A | Discharge status: Home / Self Care | Payer: Medicare HMO | Source: Ambulatory Visit | Attending: Nephrology | Admitting: Nephrology

## 2022-12-31 DIAGNOSIS — Q6102 Congenital multiple renal cysts: Secondary | ICD-10-CM | POA: Diagnosis not present

## 2023-02-06 ENCOUNTER — Telehealth: Payer: Self-pay | Admitting: Cardiovascular Disease

## 2023-02-06 ENCOUNTER — Other Ambulatory Visit: Payer: Self-pay | Admitting: Cardiovascular Disease

## 2023-02-06 NOTE — Telephone Encounter (Signed)
Please advise if ok to refill Cardiac medications being requested that are Historical provider meds. Isosorbide and Sotalol.  -Zofran will be denied due to non cardiac medication.

## 2023-02-06 NOTE — Telephone Encounter (Signed)
*  STAT* If patient is at the pharmacy, call can be transferred to refill team.   1. Which medications need to be refilled? (please list name of each medication and dose if known)   isosorbide mononitrate (IMDUR) 30 MG 24 hr tablet  sotalol (BETAPACE) 80 MG tablet  ondansetron (ZOFRAN) 4 MG tablet    2. Would you like to learn more about the convenience, safety, & potential cost savings by using the [facility]?    3. Are you open to using the [facility] (Type [facility].  ).   4. Which pharmacy/location (including street and city if local pharmacy) is medication to be sent to?[id number] - [place], [place] - [address]    5. Do they need a 30 day or 90 day supply? 90 day  Patient is out of medication

## 2023-02-09 NOTE — Telephone Encounter (Signed)
Pt's husband calling back to f/u on refill request. He states that pt is completely out of medications and has been for days now. Please advise

## 2023-02-10 NOTE — Telephone Encounter (Signed)
The patient is scheduled for an ablation in November with [facility]. She has been advised to call them about the Sotalol and possible dosing as this was not prescribed by this office.  Imdur has been filled by Dr. Welton Flakes.

## 2023-02-27 ENCOUNTER — Other Ambulatory Visit: Payer: Self-pay | Admitting: Family

## 2023-04-08 ENCOUNTER — Other Ambulatory Visit: Payer: Self-pay | Admitting: Family

## 2023-04-08 ENCOUNTER — Other Ambulatory Visit: Payer: Self-pay | Admitting: Internal Medicine

## 2023-04-10 ENCOUNTER — Other Ambulatory Visit: Payer: Self-pay | Admitting: Family

## 2023-04-20 ENCOUNTER — Telehealth: Payer: Self-pay | Admitting: Cardiovascular Disease

## 2023-04-20 NOTE — Telephone Encounter (Signed)
Patient's husband is requesting call back. States his new insurance company is requesting information on patient and if she has chronic heart condition. Please advise.

## 2023-04-20 NOTE — Telephone Encounter (Signed)
Spoke with patients spouse per release form. He said that they changed insurance and he wanted to know if she had chronic heart disease. Inquired  what exactly they need and he states there is a form that he is waiting for Dr. Dario Guardian to sign for the new insurance. Advised that her PCP office may include all of that information as well but if he needs any additional information to give [place] a call back.

## 2023-04-28 ENCOUNTER — Telehealth: Payer: Self-pay | Admitting: Gastroenterology

## 2023-04-28 NOTE — Telephone Encounter (Signed)
Error open

## 2023-05-07 ENCOUNTER — Other Ambulatory Visit: Payer: Self-pay | Admitting: Cardiovascular Disease

## 2023-05-15 ENCOUNTER — Other Ambulatory Visit: Payer: Self-pay | Admitting: Family

## 2023-05-18 ENCOUNTER — Telehealth: Payer: Self-pay

## 2023-05-18 ENCOUNTER — Other Ambulatory Visit: Payer: Self-pay

## 2023-05-18 ENCOUNTER — Ambulatory Visit: Payer: Medicare HMO | Admitting: Gastroenterology

## 2023-05-18 DIAGNOSIS — Z860100 Personal history of colon polyps, unspecified: Secondary | ICD-10-CM

## 2023-05-18 MED ORDER — NA SULFATE-K SULFATE-MG SULF 17.5-3.13-1.6 GM/177ML PO SOLN: 1.0000 | Freq: Once | ORAL | 0 refills | Status: AC

## 2023-05-18 NOTE — Telephone Encounter (Signed)
Gastroenterology Pre-Procedure Review  Request Date: 07/01/23 Requesting Physician: Dr. Tobi Bastos  PATIENT REVIEW QUESTIONS: The patient responded to the following health history questions as indicated:    1. Are you having any GI issues? yes (constipation will be doing a 2 day prep) 2. Do you have a personal history of Polyps? yes (last colonoscopy 06/26/2017 performed by Dr.Tahiliani recommended repeat in 5 yrs) 3. Do you have a family history of Colon Cancer or Polyps? no 4. Diabetes Mellitus? no 5. Joint replacements in the past 12 months?no 6. Major health problems in the past 3 months? no 7. Any artificial heart valves, MVP, or defibrillator?no 8. Cardiac health? [facility]    MEDICATIONS & ALLERGIES:    Patient reports the following regarding taking any anticoagulation/antiplatelet therapy:   Plavix, Coumadin, Eliquis, Xarelto, Lovenox, Pradaxa, Brilinta, or Effient? no Aspirin? yes (81 mg daily)  Patient confirms/reports the following medications:  Current Outpatient Medications  Medication Sig Dispense Refill   acetaminophen (TYLENOL) 500 MG tablet Take 2 tablets (1,000 mg total) by mouth every 6 (six) hours as needed for mild pain.     aspirin 81 MG tablet Take 81 mg by mouth daily.     celecoxib (CELEBREX) 100 MG capsule Take 100 mg by mouth 2 (two) times daily.     cetirizine (ZYRTEC) 10 MG tablet Take 10 mg by mouth daily.     clonazePAM (KLONOPIN) 0.5 MG tablet Take 1 tablet (0.5 mg total) by mouth 2 (two) times daily for 5 days. (Patient taking differently: Take 0.5 mg by mouth daily.) 10 tablet 0   diltiazem (CARDIZEM CD) 120 MG 24 hr capsule Take 120 mg by mouth daily.     DULoxetine (CYMBALTA) 30 MG capsule Take 30 mg by mouth daily.     estradiol (ESTRACE) 0.5 MG tablet TAKE 1 TABLET BY MOUTH EVERY DAY 90 tablet 3   furosemide (LASIX) 20 MG tablet Take 10 mg by mouth daily as needed.     gabapentin (NEURONTIN) 300 MG  capsule Take by mouth. Take 1 tablet am and 3 tablets pm daily.     isosorbide mononitrate (IMDUR) 30 MG 24 hr tablet TAKE 1 TABLET BY MOUTH TWICE A DAY 180 tablet 1   Melatonin 10 MG TABS Take 10 mg by mouth at bedtime.      metoprolol succinate (TOPROL-XL) 50 MG 24 hr tablet Take 50 mg by mouth daily.     ondansetron (ZOFRAN) 4 MG tablet Take 1 tablet (4 mg total) by mouth every 8 (eight) hours as needed for nausea. 20 tablet 0   pantoprazole (PROTONIX) 40 MG tablet 40 mg as needed.  2   ranolazine (RANEXA) 1000 MG SR tablet Take 1 tablet (1,000 mg total) by mouth 2 (two) times daily. 60 tablet 0   rosuvastatin (CRESTOR) 40 MG tablet TAKE 1 TABLET BY MOUTH EVERY DAY 90 tablet 3   sotalol (BETAPACE) 80 MG tablet Take 1 tablet by mouth daily.     tiZANidine (ZANAFLEX) 2 MG tablet Take 2 mg by mouth every 8 (eight) hours as needed.     Vitamin D, Ergocalciferol, (DRISDOL) 1.25 MG (50000 UNIT) CAPS capsule TAKE ONE CAPSULE BY MOUTH EVERY WEEK 12 capsule 2   No current facility-administered medications for this visit.    Patient confirms/reports the following allergies:  Allergies  Allergen Reactions   No Known Allergies Other (See Comments)    No orders of the defined types were placed  in this encounter.   AUTHORIZATION INFORMATION Primary Insurance: 1D#: Group #:  Secondary Insurance: 1D#: Group #:  SCHEDULE INFORMATION: Date: 07/01/23 Time: Location: [facility]

## 2023-05-18 NOTE — Progress Notes (Deleted)
Jenny Mood MD, MRCP(U.K) [address]  [address]  [place], [place] [address]  Main: [phone]  Fax: [phone]   Primary Care Physician: Jenny Mustache, MD  Primary Gastroenterologist:  Dr. Wyline Lee   No chief complaint on file.   HPI: Jenny Lee is a 67 y.o. female  Current Outpatient Medications  Medication Sig Dispense Refill   acetaminophen (TYLENOL) 500 MG tablet Take 2 tablets (1,000 mg total) by mouth every 6 (six) hours as needed for mild pain.     aspirin 81 MG tablet Take 81 mg by mouth daily.     celecoxib (CELEBREX) 100 MG capsule Take 100 mg by mouth 2 (two) times daily.     cetirizine (ZYRTEC) 10 MG tablet Take 10 mg by mouth daily.     clonazePAM (KLONOPIN) 0.5 MG tablet Take 1 tablet (0.5 mg total) by mouth 2 (two) times daily for 5 days. (Patient taking differently: Take 0.5 mg by mouth daily.) 10 tablet 0   diltiazem (CARDIZEM CD) 120 MG 24 hr capsule Take 120 mg by mouth daily.     DULoxetine (CYMBALTA) 30 MG capsule Take 30 mg by mouth daily.     estradiol (ESTRACE) 0.5 MG tablet Take 0.5 mg by mouth daily.     furosemide (LASIX) 20 MG tablet Take 10 mg by mouth daily as needed.     gabapentin (NEURONTIN) 300 MG capsule Take by mouth. Take 1 tablet am and 3 tablets pm daily.     isosorbide mononitrate (IMDUR) 30 MG 24 hr tablet TAKE 1 TABLET BY MOUTH TWICE A DAY 180 tablet 1   Melatonin 10 MG TABS Take 10 mg by mouth at bedtime.      metoprolol succinate (TOPROL-XL) 50 MG 24 hr tablet Take 50 mg by mouth daily.     ondansetron (ZOFRAN) 4 MG tablet Take 1 tablet (4 mg total) by mouth every 8 (eight) hours as needed for nausea. 20 tablet 0   pantoprazole (PROTONIX) 40 MG tablet 40 mg as needed.  2   ranolazine (RANEXA) 1000 MG SR tablet Take 1 tablet (1,000 mg total) by mouth 2 (two) times daily. 60 tablet 0   rosuvastatin (CRESTOR) 40 MG tablet TAKE 1 TABLET BY MOUTH EVERY DAY 90 tablet 3   sotalol (BETAPACE) 80 MG tablet Take 1 tablet by  mouth daily.     tiZANidine (ZANAFLEX) 2 MG tablet Take 2 mg by mouth every 8 (eight) hours as needed.     Vitamin D, Ergocalciferol, (DRISDOL) 1.25 MG (50000 UNIT) CAPS capsule TAKE ONE CAPSULE BY MOUTH EVERY WEEK 12 capsule 2   No current facility-administered medications for this visit.    Allergies as of 05/18/2023 - Review Complete 12/19/2022  Allergen Reaction Noted   No known allergies Other (See Comments) 09/08/2022       Interval history   ***/***/202*   ***/***/2024   ROS:  General: Negative for anorexia, weight loss, fever, chills, fatigue, weakness. ENT: Negative for hoarseness, difficulty swallowing , nasal congestion. CV: Negative for chest pain, angina, palpitations, dyspnea on exertion, peripheral edema.  Respiratory: Negative for dyspnea at rest, dyspnea on exertion, cough, sputum, wheezing.  GI: See history of present illness. GU:  Negative for dysuria, hematuria, urinary incontinence, urinary frequency, nocturnal urination.  Endo: Negative for unusual weight change.    Physical Examination:   There were no vitals taken for this visit.  General: Well-nourished, well-developed in no acute distress.  Eyes: No icterus. Conjunctivae pink.  Mouth: Oropharyngeal mucosa moist and pink , no lesions erythema or exudate. Lungs: Clear to auscultation bilaterally. Non-labored. Heart: Regular rate and rhythm, no murmurs rubs or gallops.  Abdomen: Bowel sounds are normal, nontender, nondistended, no hepatosplenomegaly or masses, no abdominal bruits or hernia , no rebound or guarding.   Extremities: No lower extremity edema. No clubbing or deformities. Neuro: Alert and oriented x 3.  Grossly intact. Skin: Warm and dry, no jaundice.   Psych: Alert and cooperative, normal Lee and affect.   Imaging Studies: No results found.  Assessment and Plan:   Jenny Lee is a 67 y.o. y/o female ***    Dr Jenny Mood  MD,MRCP (U.K) Follow up in ***  BP check ***

## 2023-05-19 ENCOUNTER — Telehealth: Payer: Self-pay

## 2023-05-19 NOTE — Telephone Encounter (Signed)
Spoke with patient who is agreeable to do a tele visit on 1/22 at 10:40 am. Consent done, med rec to be completed.

## 2023-05-19 NOTE — Telephone Encounter (Signed)
  Patient Consent for Virtual Visit        Jenny Lee has provided verbal consent on 05/19/2023 for a virtual visit (video or telephone).   CONSENT FOR VIRTUAL VISIT FOR:  Jenny Lee  By participating in this virtual visit I agree to the following:  I hereby voluntarily request, consent and authorize [facility] and its employed or contracted physicians, physician assistants, nurse practitioners or other licensed health care professionals (the Practitioner), to provide me with telemedicine health care services (the "Services") as deemed necessary by the treating Practitioner. I acknowledge and consent to receive the Services by the Practitioner via telemedicine. I understand that the telemedicine visit will involve communicating with the Practitioner through live audiovisual communication technology and the disclosure of certain medical information by electronic transmission. I acknowledge that I have been given the opportunity to request an in-person assessment or other available alternative prior to the telemedicine visit and am voluntarily participating in the telemedicine visit.  I understand that I have the right to withhold or withdraw my consent to the use of telemedicine in the course of my care at any time, without affecting my right to future care or treatment, and that the Practitioner or I may terminate the telemedicine visit at any time. I understand that I have the right to inspect all information obtained and/or recorded in the course of the telemedicine visit and may receive copies of available information for a reasonable fee.  I understand that some of the potential risks of receiving the Services via telemedicine include:  Delay or interruption in medical evaluation due to technological equipment failure or disruption; Information transmitted may not be sufficient (e.g. poor resolution of images) to allow for appropriate medical decision making by the  Practitioner; and/or  In rare instances, security protocols could fail, causing a breach of personal health information.  Furthermore, I acknowledge that it is my responsibility to provide information about my medical history, conditions and care that is complete and accurate to the best of my ability. I acknowledge that Practitioner's advice, recommendations, and/or decision may be based on factors not within their control, such as incomplete or inaccurate data provided by me or distortions of diagnostic images or specimens that may result from electronic transmissions. I understand that the practice of medicine is not an exact science and that Practitioner makes no warranties or guarantees regarding treatment outcomes. I acknowledge that a copy of this consent can be made available to me via my patient portal [facility] MyChart), or I can request a printed copy by calling the office of [facility].    I understand that my insurance will be billed for this visit.   I have read or had this consent read to me. I understand the contents of this consent, which adequately explains the benefits and risks of the Services being provided via telemedicine.  I have been provided ample opportunity to ask questions regarding this consent and the Services and have had my questions answered to my satisfaction. I give my informed consent for the services to be provided through the use of telemedicine in my medical care

## 2023-05-19 NOTE — Telephone Encounter (Signed)
   Name: Jenny Lee  DOB: 05-30-1955  MRN: [id number]  Primary Cardiologist: Lorine Bears, MD   Preoperative team, please contact this patient and set up a phone call appointment for further preoperative risk assessment. Please obtain consent and complete medication review. Thank you for your help.  I confirm that guidance regarding antiplatelet and oral anticoagulation therapy has been completed and, if necessary, noted below.  None requested.  I also confirmed the patient resides in the state of [place]. As per [facility] Medical Board telemedicine laws, the patient must reside in the state in which the provider is licensed.   Ronney Asters, NP 05/19/2023, 10:35 AM [facility]

## 2023-05-19 NOTE — Telephone Encounter (Signed)
   Pre-operative Risk Assessment    Patient Name: Jenny Lee  DOB: Oct 13, 1955 MRN: [id number]   LOV: 12/04/22: DR. Kirke Corin   NOV: NONE  Request for Surgical Clearance    Procedure:   COLONOSCOPY  Date of Surgery:  Clearance 07/01/23                                 Surgeon:  DR. Tobi Bastos Surgeon's Group or Practice Name:  [facility]  Phone number:  [phone] Fax number:  [phone]   Type of Clearance Requested:   - Medical    Type of Anesthesia:  General    Additional requests/questions:    Sandre Kitty   05/19/2023, 10:23 AM

## 2023-06-17 ENCOUNTER — Ambulatory Visit (INDEPENDENT_AMBULATORY_CARE_PROVIDER_SITE_OTHER): Payer: Medicaid Other

## 2023-06-17 DIAGNOSIS — Z01810 Encounter for preprocedural cardiovascular examination: Secondary | ICD-10-CM

## 2023-06-17 NOTE — Progress Notes (Signed)
I have reached out to the pt with and appt tomorrow with Ward Givens, NP 06/18/23 @ 1:55 in the [place] office. Pt has been advised if symptoms worsen between now and the appt, to go to the ED. Pt verbalized understanding.

## 2023-06-17 NOTE — Progress Notes (Signed)
Virtual Visit via Telephone Note   Because of Jenny Lee's co-morbid illnesses, she is at least at moderate risk for complications without adequate follow up.  This format is felt to be most appropriate for this patient at this time.  The patient did not have access to video technology/had technical difficulties with video requiring transitioning to audio format only (telephone).  All issues noted in this document were discussed and addressed.  No physical exam could be performed with this format.  Please refer to the patient's chart for her consent to telehealth for [facility].  Evaluation Performed:  Preoperative cardiovascular risk assessment _____________   Date:  06/17/2023   Patient ID:  Jenny Lee, DOB 02-21-1956, MRN [id number] Patient Location:  Home Provider location:   Office  Primary Care Provider:  Sherrie Mustache, MD Primary Cardiologist:  Lorine Bears, MD  Chief Complaint / Patient Profile   68 y.o. y/o female with a h/o paroxysmal SVT, OSA (on CPAP) GERD, Takotsubo cardiomyopathy, HTN, HLD who is pending colonoscopy and presents today for telephonic preoperative cardiovascular risk assessment.  History of Present Illness    Jenny Lee is a 68 y.o. female who presents via audio/video conferencing for a telehealth visit today.  Pt was last seen in cardiology clinic on 12/04/2022 by Dr. Kirke Corin.  At that time KARMAN CASIDA was doing well with no new cardiac complaints with controlled BP.  The patient is now pending procedure as outlined above. Since her last visit, she reports experiencing 2 episodes of chest pressure with no pain without any dizziness or shortness of breath noted.  She experienced her most recent episode on 06/15/2023 who and stated that her discomfort relieved with rest and water.  She was advised to seek care in the ED if chest pressure returns prior to her in person follow-up.  I have counseled her appointment for today and  no charged and advised her to be seen in person for further evaluation prior to her colonoscopy.   Past Medical History    Past Medical History:  Diagnosis Date   Anxiety    a.) on BZO (clonazepam) PRN   Atypical angina    CAD (coronary artery disease) 11/2000   a.) s/p NSTEMI 11/2000 --> LHC 12/22/2000 --> 100% mPDA -->  no PCI (stent) placed; SCAD of dLAD; b.) LHC 04/26/2003: normal coronaries; c.) LHC 11/26/2015: EF 35%, anteroapical aneurysm, normal coronary arteries; d.) cCTA 11/12/2022: Ca2+ score 0   Chronic pain syndrome    Diastolic dysfunction    a.) TTE 09/12/2022: EF 60-65%, AoV sclerosis, mild MR/AR, G1DD   Diverticulosis    Dyspnea    Female genuine stress incontinence 08/15/2014   GERD (gastroesophageal reflux disease)    H. pylori infection    History of 2019 novel coronavirus disease (COVID-19) 05/21/2020   HLD (hyperlipidemia)    HTN (hypertension)    Insomnia    a.) takes melatonin   Kidney cysts    Mastalgia 10/06/2012   NSTEMI (non-ST elevated myocardial infarction) (HCC) 11/2000   a.) LHC 12/22/2000 --> 100% mPDA --> no PCI (stents) placed.  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   Takotsubo cardiomyopathy    a.) LHC 11/26/2015: EF 35%; b.) TTE 09/12/2022: EF 60-65%   Vitamin D deficiency    Past Surgical History:  Procedure Laterality Date   ABDOMINAL HYSTERECTOMY  05/26/2002   CARDIAC CATHETERIZATION N/A 11/26/2015   Procedure: Left Heart Cath and Coronary Angiography;  Surgeon: Laurier Nancy, MD;  Location: [facility] INVASIVE CV LAB;  Service: Cardiovascular;  Laterality: N/A;   CERVICAL SPINE SURGERY  05/26/1996   fusion   CLAVICLE SURGERY Right    shaved bone   COLONOSCOPY WITH PROPOFOL N/A 12/23/2016   Procedure: COLONOSCOPY WITH PROPOFOL;  Surgeon: [name], MD;  Location: [facility] ENDOSCOPY;  Service: Endoscopy;  Laterality: N/A;   COLONOSCOPY WITH PROPOFOL N/A 06/26/2017   Procedure: COLONOSCOPY WITH PROPOFOL;  Surgeon: Pasty Spillers, MD;  Location: [facility] ENDOSCOPY;  Service: Gastroenterology;  Laterality: N/A;   CORONARY BALLOON ANGIOPLASTY  11/2000   ESOPHAGOGASTRODUODENOSCOPY (EGD) WITH PROPOFOL N/A 12/23/2016   Procedure: ESOPHAGOGASTRODUODENOSCOPY (EGD) WITH PROPOFOL;  Surgeon: [name], MD;  Location: [facility] ENDOSCOPY;  Service: Endoscopy;  Laterality: N/A;   ESOPHAGOGASTRODUODENOSCOPY (EGD) WITH PROPOFOL N/A 03/31/2017   Procedure: ESOPHAGOGASTRODUODENOSCOPY (EGD) WITH PROPOFOL WITH GASTRIC MAPPING;  Surgeon: [name], MD;  Location[facility] ENDOSCOPY;  Service: Gastroenterology;  Laterality: N/A;   ESOPHAGOGASTRODUODENOSCOPY (EGD) WITH PROPOFOL N/A 12/21/2017   Procedure: ESOPHAGOGASTRODUODENOSCOPY (EGD) WITH PROPOFOL;  Surgeon: [name], MD;  Location: [facility] ENDOSCOPY;  Service: Gastroenterology;  Laterality: N/A;   LEFT HEART CATH AND CORONARY ANGIOGRAPHY Left 04/26/2003   Procedure: LEFT HEART CATH AND CORONARY ANGIOGRAPHY; Location: [facility]; Surgeon: Rudean Hitt, MD   RENAL ANGIOGRAPHY Bilateral 03/02/2017   Procedure: RENAL ANGIOGRAPHY;  Surgeon: Annice Needy, MD;  Location: [facility] INVASIVE CV LAB;  Service: Cardiovascular;  Laterality: Bilateral;   VAGINAL PROLAPSE REPAIR      Allergies  Allergies  Allergen Reactions   No Known Allergies Other (See Comments)    Home Medications    Prior to Admission medications   Medication Sig Start Date End Date Taking? Authorizing Provider  acetaminophen (TYLENOL) 500 MG tablet Take 2 tablets (1,000 mg  total) by mouth every 6 (six) hours as needed for mild pain. 11/20/22   Henrene Dodge, MD  aspirin 81 MG tablet Take 81 mg by mouth daily.    [provider]  celecoxib (CELEBREX) 100 MG capsule Take 100 mg by mouth 2 (two) times daily. 06/25/21   [provider]  cetirizine (ZYRTEC) 10 MG tablet Take 10 mg by mouth daily.    [provider]  clonazePAM (KLONOPIN) 0.5 MG tablet Take 1 tablet (0.5 mg total) by mouth 2 (two) times daily for 5 days. Patient taking differently: Take 0.5 mg by mouth daily. 09/13/22 12/04/22  Baldwin Jamaica, MD  diltiazem (CARDIZEM CD) 120 MG 24 hr capsule Take 120 mg by mouth daily. 06/02/22   [provider]  DULoxetine (CYMBALTA) 30 MG capsule Take 30 mg by mouth daily.    [provider]  enalapril (VASOTEC) 20 MG tablet Take by mouth. 03/22/18   [provider]  estradiol (ESTRACE) 0.5 MG tablet TAKE 1 TABLET BY MOUTH EVERY DAY 05/18/23   Miki Kins, FNP  furosemide (LASIX) 20 MG tablet Take 10 mg by mouth daily as needed. 01/31/22   [provider]  gabapentin (NEURONTIN) 300 MG capsule Take by mouth. Take 1 tablet am  and 3 tablets pm daily. 03/25/18   [provider]  isosorbide mononitrate (IMDUR) 30 MG 24 hr tablet TAKE 1 TABLET BY MOUTH TWICE A DAY 05/07/23   Laurier Nancy, MD  Melatonin 10 MG TABS Take 10 mg by mouth at bedtime.     [provider]  metoprolol succinate (TOPROL-XL) 50 MG 24 hr tablet Take 50 mg by mouth daily. 06/02/22   [provider]  ondansetron (ZOFRAN) 4 MG tablet Take 1 tablet (4 mg total) by mouth every 8 (eight) hours as needed for nausea. 11/11/22   Donovan Kail, PA-C  pantoprazole (PROTONIX) 40 MG tablet 40 mg as needed. 08/28/17   [provider]  ranolazine (RANEXA) 1000 MG SR tablet Take 1 tablet (1,000 mg total) by mouth 2 (two) times daily. 09/13/22 10/21/22  Baldwin Jamaica, MD  rosuvastatin (CRESTOR) 40 MG tablet TAKE 1 TABLET BY  MOUTH EVERY DAY 02/27/23   Miki Kins, FNP  sotalol (BETAPACE) 80 MG tablet Take 1 tablet by mouth daily. 01/28/22   [provider]  tiZANidine (ZANAFLEX) 2 MG tablet Take 2 mg by mouth every 8 (eight) hours as needed. 08/19/22   [provider]  Vitamin D, Ergocalciferol, (DRISDOL) 1.25 MG (50000 UNIT) CAPS capsule TAKE ONE CAPSULE BY MOUTH EVERY WEEK 04/08/23   Margaretann Loveless, MD    Physical Exam    Vital Signs:  DONTE ENNEN does not have vital signs available for review today.  Given telephonic nature of communication, physical exam is limited. AAOx3. NAD. Normal affect.  Speech and respirations are unlabored.  Accessory Clinical Findings    None  Assessment & Plan    1.  Preoperative Cardiovascular Risk Assessment: -Patient's RCRI score is 0.9%  Due to evolving symptoms patient was advised to be seen in person for preoperative clearance.  Time:   Today, I have spent 8 minutes with the patient with telehealth technology discussing medical history, symptoms, and management plan.     Napoleon Form, Leodis Rains, NP  06/17/2023, 7:39 AM

## 2023-06-17 NOTE — Progress Notes (Signed)
I will send a message to the [facility] triage to see if they can get the pt in soon due to per preop APP Robin Searing, NP; new onset of chest pressure with exertion. Pt will need preop clearance as well.

## 2023-06-18 ENCOUNTER — Ambulatory Visit: Payer: PPO | Attending: Nurse Practitioner | Admitting: Nurse Practitioner

## 2023-06-18 ENCOUNTER — Encounter: Payer: Self-pay | Admitting: Nurse Practitioner

## 2023-06-18 VITALS — BP 122/88 | HR 84 | Ht 65.0 in | Wt 170.0 lb

## 2023-06-18 DIAGNOSIS — E785 Hyperlipidemia, unspecified: Secondary | ICD-10-CM | POA: Diagnosis not present

## 2023-06-18 DIAGNOSIS — I10 Essential (primary) hypertension: Secondary | ICD-10-CM | POA: Diagnosis not present

## 2023-06-18 DIAGNOSIS — I2542 Coronary artery dissection: Secondary | ICD-10-CM

## 2023-06-18 DIAGNOSIS — I471 Supraventricular tachycardia, unspecified: Secondary | ICD-10-CM

## 2023-06-18 DIAGNOSIS — Z01810 Encounter for preprocedural cardiovascular examination: Secondary | ICD-10-CM | POA: Diagnosis not present

## 2023-06-18 DIAGNOSIS — R072 Precordial pain: Secondary | ICD-10-CM

## 2023-06-18 NOTE — Patient Instructions (Signed)
Medication Instructions:  No changes *If you need a refill on your cardiac medications before your next appointment, please call your pharmacy*   Lab Work: None ordered If you have labs (blood work) drawn today and your tests are completely normal, you will receive your results only by: MyChart Message (if you have MyChart) OR A paper copy in the mail If you have any lab test that is abnormal or we need to change your treatment, we will call you to review the results.   Testing/Procedures: None ordered   Follow-Up: At [facility], you and your health needs are our priority.  As part of our continuing mission to provide you with exceptional heart care, we have created designated Provider Care Teams.  These Care Teams include your primary Cardiologist (physician) and Advanced Practice Providers (APPs -  Physician Assistants and Nurse Practitioners) who all work together to provide you with the care you need, when you need it.  We recommend signing up for the patient portal called "MyChart".  Sign up information is provided on this After Visit Summary.  MyChart is used to connect with patients for Virtual Visits (Telemedicine).  Patients are able to view lab/test results, encounter notes, upcoming appointments, etc.  Non-urgent messages can be sent to your provider as well.   To learn more about what you can do with MyChart, go to [email or web address].    Your next appointment:   3 month(s)  Provider:   You may see Lorine Bears, MD or one of the following Advanced Practice Providers on your designated Care Team:   Nicolasa Ducking, NP

## 2023-06-18 NOTE — Progress Notes (Signed)
Office Visit    Patient Name: Jenny Lee Date of Encounter: 06/18/2023  Primary Care Provider:  Sherrie Mustache, MD Primary Cardiologist:  Lorine Bears, MD  Chief Complaint    68 y.o. female with a history of spontaneous coronary artery dissection, hypertension, hyperlipidemia, diastolic dysfunction, PSVT, sleep apnea, and renal artery stenosis status post PTA in 2018, who presents for cardiology follow-up.  Past Medical History  Subjective   Past Medical History:  Diagnosis Date   Anxiety    a.) on BZO (clonazepam) PRN   Atypical angina (HCC)    CAD (coronary artery disease) 11/2000   a.) s/p NSTEMI 11/2000 --> LHC 12/22/2000 --> 100% mPDA -->  no PCI placed; SCAD of dLAD; b.) LHC 04/26/2003: normal coronaries; c.) LHC 11/26/2015: EF 35%, anteroapical aneurysm, normal coronary arteries; d.) cCTA 11/12/2022: Ca2+ score 0   Chronic pain syndrome    Diastolic dysfunction    a.) TTE 09/12/2022: EF 60-65%, AoV sclerosis, mild MR/AR, G1DD   Diverticulosis    Dyspnea    Female genuine stress incontinence 08/15/2014   GERD (gastroesophageal reflux disease)    H. pylori infection    History of 2019 novel coronavirus disease (COVID-19) 05/21/2020   HLD (hyperlipidemia)    HTN (hypertension)    Insomnia    a.) takes melatonin   Kidney cysts    Mastalgia 10/06/2012   NSTEMI (non-ST elevated myocardial infarction) (HCC) 11/2000   a.) LHC 12/22/2000 --> 100% mPDA --> no PCI (stents) placed.  Procedure complicated by (+) SCAD of dLAD with resulting ST elevation --> IABP placed and patient transferred to [facility]   Obstructive sleep apnea on CPAP    Paroxysmal SVT (supraventricular tachycardia) (HCC)    a. on sotalol - followed by [facility] EP.   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 02/2017 s/p PTA of RRA.   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   Takotsubo cardiomyopathy    a.) LHC 11/26/2015: EF 35%; b.) TTE 09/12/2022: EF 60-65%   Vitamin D deficiency    Past Surgical History:  Procedure Laterality Date   ABDOMINAL HYSTERECTOMY  05/26/2002   CARDIAC CATHETERIZATION N/A 11/26/2015   Procedure: Left Heart Cath and Coronary Angiography;  Surgeon: Laurier Nancy, MD;  Location: [facility] INVASIVE CV LAB;  Service: Cardiovascular;  Laterality: N/A;   CERVICAL SPINE SURGERY  05/26/1996   fusion   CLAVICLE SURGERY Right    shaved bone   COLONOSCOPY WITH PROPOFOL N/A 12/23/2016   Procedure: COLONOSCOPY WITH PROPOFOL;  Surgeon: [name], MD;  Location: [facility] ENDOSCOPY;  Service: Endoscopy;  Laterality: N/A;   COLONOSCOPY WITH PROPOFOL N/A 06/26/2017   Procedure: COLONOSCOPY WITH PROPOFOL;  Surgeon: Pasty Spillers, MD;  Location: [facility] ENDOSCOPY;  Service: Gastroenterology;  Laterality: N/A;   CORONARY BALLOON ANGIOPLASTY  11/2000   ESOPHAGOGASTRODUODENOSCOPY (EGD) WITH PROPOFOL N/A 12/23/2016   Procedure: ESOPHAGOGASTRODUODENOSCOPY (EGD) WITH PROPOFOL;  Surgeon: [name], MD;  Location: [facility] ENDOSCOPY;  Service: Endoscopy;  Laterality: N/A;   ESOPHAGOGASTRODUODENOSCOPY (EGD) WITH PROPOFOL N/A 03/31/2017   Procedure: ESOPHAGOGASTRODUODENOSCOPY (EGD) WITH PROPOFOL WITH GASTRIC MAPPING;  Surgeon: [name], MD;  Location: [facility] ENDOSCOPY;  Service: Gastroenterology;  Laterality: N/A;   ESOPHAGOGASTRODUODENOSCOPY (EGD) WITH PROPOFOL N/A 12/21/2017  Procedure: ESOPHAGOGASTRODUODENOSCOPY (EGD) WITH PROPOFOL;  Surgeon: [name], MD;  Location: [facility] ENDOSCOPY;  Service: Gastroenterology;  Laterality: N/A;   LEFT HEART CATH AND CORONARY ANGIOGRAPHY Left 04/26/2003   Procedure: LEFT HEART CATH AND CORONARY ANGIOGRAPHY; Location: [facility]; Surgeon: Rudean Hitt, MD   RENAL ANGIOGRAPHY Bilateral 03/02/2017   Procedure:  RENAL ANGIOGRAPHY;  Surgeon: Annice Needy, MD;  Location: [facility] INVASIVE CV LAB;  Service: Cardiovascular;  Laterality: Bilateral;   VAGINAL PROLAPSE REPAIR      Allergies  Allergies  Allergen Reactions   No Known Allergies Other (See Comments)      History of Present Illness      68 y.o. y/o female history of spontaneous coronary artery dissection, hypertension, hyperlipidemia, diastolic dysfunction, PSVT on sotalol followed by electrophysiology at [facility], sleep apnea, and renal artery stenosis status post PTA in 2018.  In 2002, she suffered a non-STEMI at the age of 61.  Echo showed normal LV function with apical wall motion abnormality.  Diagnostic catheterization showed an occluded RPDA with appearance highly suggestive of spontaneous coronary artery dissection.  She also had an abnormality in the mid to distal LAD likely representative of scad.  She required aortic balloon pump placement and was transferred to [facility] at that time and was managed medically without revascularization.  She underwent repeat catheterization by Dr. Welton Flakes in 2017, which showed completely normal arteries with recanalization of the RPDA and normal appearance of the LAD.  There was persistent wall motion abnormality in the inferior apical region.  More recently, she has been followed by [facility] cardiology for PSVT, which has been managed with sotalol therapy.  In April 2024, she presented to the emergency department with complaint of tachycardia and chest pain.  Troponin was minimally elevated.  Echo showed normal LV function without significant valvular abnormalities.  In June 2024, she underwent coronary CT angiogram which showed normal coronary arteries with a calcium score of 0.  She continued to have intermittent abdominal discomfort and underwent cholecystectomy in June 2024.   Patient was last seen in general cardiology clinic in July 2024.  More recently, she has been seen by her electrophysiology team at [facility] with plan for  implantable loop recorder at some point in the near future in the setting of ongoing complaints of tachycardia despite sotalol therapy.  About a week ago, Ms. Dorminey had an episode of chest discomfort that occurred while she was putting some flowers in a pot.  This was similar to symptoms in the past including symptoms prior to recent coronary CTA.  Symptoms resolved within about 30 minutes, after taking a Klonopin.  She has had no recurrence.  She denies dyspnea, PND, orthopnea, dizziness, syncope, edema, or early satiety.  She has been having abdominal discomfort and nausea despite her cholecystectomy in June 2024, and is pending colonoscopy. Objective  Home Medications    Current Outpatient Medications  Medication Sig Dispense Refill   acetaminophen (TYLENOL) 500 MG tablet Take 2 tablets (1,000 mg total) by mouth every 6 (six) hours as needed for mild pain.     aspirin 81 MG tablet Take 81 mg by mouth daily.     celecoxib (CELEBREX) 100 MG capsule Take 100 mg by mouth 2 (two) times daily.     cetirizine (ZYRTEC) 10 MG tablet Take 10 mg by mouth daily.     clonazePAM (KLONOPIN) 0.5 MG tablet Take 1 tablet (0.5 mg total) by mouth 2 (two) times daily for 5 days. (Patient taking differently: Take 0.5 mg  by mouth daily.) 10 tablet 0   diltiazem (CARDIZEM CD) 120 MG 24 hr capsule Take 120 mg by mouth daily.     DULoxetine (CYMBALTA) 30 MG capsule Take 30 mg by mouth daily.     enalapril (VASOTEC) 20 MG tablet Take by mouth.     estradiol (ESTRACE) 0.5 MG tablet TAKE 1 TABLET BY MOUTH EVERY DAY 90 tablet 3   furosemide (LASIX) 20 MG tablet Take 10 mg by mouth daily as needed.     gabapentin (NEURONTIN) 300 MG capsule Take by mouth. Take 1 tablet am and 3 tablets pm daily.     isosorbide mononitrate (IMDUR) 30 MG 24 hr tablet TAKE 1 TABLET BY MOUTH TWICE A DAY 180 tablet 1   Melatonin 10 MG TABS Take 10 mg by mouth at bedtime.      ondansetron (ZOFRAN) 4 MG tablet Take 1 tablet (4 mg total) by mouth  every 8 (eight) hours as needed for nausea. 20 tablet 0   pantoprazole (PROTONIX) 40 MG tablet 40 mg as needed.  2   ranolazine (RANEXA) 1000 MG SR tablet Take 1 tablet (1,000 mg total) by mouth 2 (two) times daily. 60 tablet 0   rosuvastatin (CRESTOR) 40 MG tablet TAKE 1 TABLET BY MOUTH EVERY DAY 90 tablet 3   sotalol (BETAPACE) 80 MG tablet Take 1 tablet by mouth daily.     tiZANidine (ZANAFLEX) 2 MG tablet Take 2 mg by mouth every 8 (eight) hours as needed.     Vitamin D, Ergocalciferol, (DRISDOL) 1.25 MG (50000 UNIT) CAPS capsule TAKE ONE CAPSULE BY MOUTH EVERY WEEK 12 capsule 2   metoprolol succinate (TOPROL-XL) 50 MG 24 hr tablet Take 50 mg by mouth daily. (Patient not taking: Reported on 06/18/2023)     No current facility-administered medications for this visit.     Physical Exam    VS:  BP (!) 162/98   Pulse 84   Ht 5\' 5"  (1.651 m)   Wt 170 lb (77.1 kg)   SpO2 99%   BMI 28.29 kg/m  , BMI Body mass index is 28.29 kg/m.     Vitals:   06/18/23 1408 06/18/23 1839  BP: (!) 162/98 122/88  Pulse: 84   SpO2: 99%       GEN: Well nourished, well developed, in no acute distress. HEENT: normal. Neck: Supple, no JVD, carotid bruits, or masses. Cardiac: RRR, no murmurs, rubs, or gallops. No clubbing, cyanosis, edema.  Radials 2+/PT 2+ and equal bilaterally.  Respiratory:  Respirations regular and unlabored, clear to auscultation bilaterally. GI: Soft, nontender, nondistended, BS + x 4. MS: no deformity or atrophy. Skin: warm and dry, no rash. Neuro:  Strength and sensation are intact. Psych: Normal affect.  Accessory Clinical Findings    ECG personally reviewed by me today - EKG Interpretation Date/Time:  Thursday June 18 2023 14:05:47 EST Ventricular Rate:  84 PR Interval:  154 QRS Duration:  70 QT Interval:  372 QTC Calculation: 439 R Axis:   17  Text Interpretation: Normal sinus rhythm Cannot rule out Anterior infarct , age undetermined Confirmed by Nicolasa Ducking [phone]) on 06/18/2023 2:20:42 PM  - no acute changes.  Lab Results  Component Value Date   WBC 7.5 12/19/2022   HGB 12.4 12/19/2022   HCT 36.7 12/19/2022   MCV 81.7 12/19/2022   PLT 240 12/19/2022   Lab Results  Component Value Date   CREATININE 0.92 12/19/2022   BUN 18 12/19/2022   NA 133 (  L) 12/19/2022   K 4.4 12/19/2022   CL 100 12/19/2022   CO2 24 12/19/2022   Lab Results  Component Value Date   ALT 14 02/06/2022   AST 25 02/06/2022   ALKPHOS 76 02/06/2022   BILITOT 0.6 02/06/2022   Lab Results  Component Value Date   CHOL 155 09/12/2022   HDL 54 09/12/2022   LDLCALC 91 09/12/2022   TRIG 52 09/12/2022   CHOLHDL 2.9 09/12/2022    Lab Results  Component Value Date   HGBA1C 6.2 (H) 11/26/2015   Lab Results  Component Value Date   TSH 0.607 12/21/2015       Assessment & Plan    1.  History of spontaneous coronary artery dissection/chest pain: Status post scad in 2002 involving the LAD and RPDA.  Catheterization in 2017 showed normal coronary arteries, as did coronary CT angiogram in June 2024.  She has a long history of intermittent chest pain, including a 30-minute episode last week, and remains on isosorbide mononitrate, Ranexa, Toprol XL, and diltiazem CD.  Symptoms last week or similar to what she experienced prior to normal coronary CT angiogram in June 2024.  ECG is unchanged today.  In that setting, no further ischemic evaluation is warranted at this time.  Continue current medical therapy.  2.  Preprocedure cardiovascular risk evaluation: Patient pending colonoscopy in the setting of ongoing abdominal discomfort and nausea.  As above, she does experience intermittent chest discomfort with normal coronary CT angiogram with calcium score of 0 in June 2024.  She is low risk for cardiovascular complications related to pending colonoscopy, and may proceed without further ischemic evaluation.  3.  PSVT: Intermittent complaints of tachycardia despite  sotalol, metoprolol, and diltiazem therapy.  QTc is normal today.  She is followed by [facility] electrophysiology with plan for implantable loop recorder in the near future.  4.  Primary hypertension: Blood pressure initially elevated on arrival though improved to 122/88.  Continue current regimen.  5.  Hyperlipidemia: On rosuvastatin therapy with an LDL of 91 in April of last year.  LFTs were normal in September.  6.  Disposition: Follow-up in 6 months or sooner if necessary.  Nicolasa Ducking, NP 06/18/2023, 6:32 PM

## 2023-06-19 NOTE — Telephone Encounter (Signed)
Cardiac clearance noted on chart note dated 06/18/23 from Dr. Nicolasa Ducking.  Thanks,  [place], [place]

## 2023-06-30 ENCOUNTER — Encounter: Payer: Self-pay | Admitting: Gastroenterology

## 2023-07-01 ENCOUNTER — Ambulatory Visit: Payer: PPO | Admitting: Anesthesiology

## 2023-07-01 ENCOUNTER — Ambulatory Visit
Admission: RE | Admit: 2023-07-01 | Discharge: 2023-07-01 | Disposition: A | Discharge status: Home / Self Care | Payer: PPO | Attending: Gastroenterology | Admitting: Gastroenterology

## 2023-07-01 ENCOUNTER — Encounter
Admission: RE | Disposition: A | Discharge status: Home / Self Care | Payer: Self-pay | Source: Home / Self Care | Attending: Gastroenterology

## 2023-07-01 DIAGNOSIS — F419 Anxiety disorder, unspecified: Secondary | ICD-10-CM | POA: Diagnosis not present

## 2023-07-01 DIAGNOSIS — Z1211 Encounter for screening for malignant neoplasm of colon: Secondary | ICD-10-CM | POA: Insufficient documentation

## 2023-07-01 DIAGNOSIS — I739 Peripheral vascular disease, unspecified: Secondary | ICD-10-CM | POA: Diagnosis not present

## 2023-07-01 DIAGNOSIS — I252 Old myocardial infarction: Secondary | ICD-10-CM | POA: Insufficient documentation

## 2023-07-01 DIAGNOSIS — G4733 Obstructive sleep apnea (adult) (pediatric): Secondary | ICD-10-CM | POA: Insufficient documentation

## 2023-07-01 DIAGNOSIS — K5730 Diverticulosis of large intestine without perforation or abscess without bleeding: Secondary | ICD-10-CM | POA: Diagnosis not present

## 2023-07-01 DIAGNOSIS — K219 Gastro-esophageal reflux disease without esophagitis: Secondary | ICD-10-CM | POA: Insufficient documentation

## 2023-07-01 DIAGNOSIS — K635 Polyp of colon: Secondary | ICD-10-CM | POA: Diagnosis not present

## 2023-07-01 DIAGNOSIS — I10 Essential (primary) hypertension: Secondary | ICD-10-CM | POA: Diagnosis not present

## 2023-07-01 DIAGNOSIS — I2510 Atherosclerotic heart disease of native coronary artery without angina pectoris: Secondary | ICD-10-CM | POA: Insufficient documentation

## 2023-07-01 DIAGNOSIS — Z860100 Personal history of colon polyps, unspecified: Secondary | ICD-10-CM

## 2023-07-01 HISTORY — PX: COLONOSCOPY WITH PROPOFOL: SHX5780

## 2023-07-01 HISTORY — PX: POLYPECTOMY: SHX5525

## 2023-07-01 SURGERY — COLONOSCOPY WITH PROPOFOL
Anesthesia: General

## 2023-07-01 MED ORDER — SODIUM CHLORIDE 0.9 % IV SOLN: INTRAVENOUS | Status: DC

## 2023-07-01 MED ORDER — LIDOCAINE HCL (PF) 2 % IJ SOLN
INTRAMUSCULAR | Status: AC
  Filled 2023-07-01: qty 5

## 2023-07-01 MED ORDER — PROPOFOL 500 MG/50ML IV EMUL
INTRAVENOUS | Status: DC | PRN
  Administered 2023-07-01: 75 ug/kg/min via INTRAVENOUS

## 2023-07-01 MED ORDER — EPHEDRINE SULFATE-NACL 50-0.9 MG/10ML-% IV SOSY
PREFILLED_SYRINGE | INTRAVENOUS | Status: DC | PRN
  Administered 2023-07-01: 10 mg via INTRAVENOUS

## 2023-07-01 MED ORDER — DEXMEDETOMIDINE HCL IN NACL 80 MCG/20ML IV SOLN
INTRAVENOUS | Status: DC | PRN
  Administered 2023-07-01: 20 ug via INTRAVENOUS

## 2023-07-01 MED ORDER — PROPOFOL 10 MG/ML IV BOLUS
INTRAVENOUS | Status: DC | PRN
  Administered 2023-07-01 (×2): 50 mg via INTRAVENOUS

## 2023-07-01 MED ORDER — LIDOCAINE HCL (CARDIAC) PF 100 MG/5ML IV SOSY
PREFILLED_SYRINGE | INTRAVENOUS | Status: DC | PRN
  Administered 2023-07-01: 60 mg via INTRAVENOUS

## 2023-07-01 NOTE — Transfer of Care (Signed)
 Immediate Anesthesia Transfer of Care Note  Patient: Jenny Lee  Procedure(s) Performed: COLONOSCOPY WITH PROPOFOL   Patient Location: PACU  Anesthesia Type:General  Level of Consciousness: sedated  Airway & Oxygen Therapy: Patient Spontanous Breathing  Post-op Assessment: Report given to RN and Post -op Vital signs reviewed and stable  Post vital signs: Reviewed and stable  Last Vitals:  Vitals Value Taken Time  BP    Temp    Pulse    Resp    SpO2      Last Pain:  Vitals:   07/01/23 0816  TempSrc: Temporal  PainSc: 0-No pain         Complications: No notable events documented.

## 2023-07-01 NOTE — Op Note (Addendum)
 [facility] Gastroenterology Patient Name: Jenny Lee Procedure Date: 07/01/2023 8:36 AM MRN: [id number] Account #: [id number] Date of Birth: 04-May-1956 Admit Type: Outpatient Age: 68 Room: [facility] ENDO ROOM 3 Gender: Female Note Status: Finalized Instrument Name: Arvis [id number] Procedure:             Colonoscopy Indications:           Surveillance: Personal history of adenomatous polyps                         on last colonoscopy 5 years ago, Last colonoscopy:                         February 2019 Providers:             Ruel Kung MD, MD Medicines:             Monitored Anesthesia Care Complications:         No immediate complications. Procedure:             Pre-Anesthesia Assessment:                        - Prior to the procedure, a History and Physical was                         performed, and patient medications, allergies and                         sensitivities were reviewed. The patient's tolerance                         of previous anesthesia was reviewed.                        - The risks and benefits of the procedure and the                         sedation options and risks were discussed with the                         patient. All questions were answered and informed                         consent was obtained.                        - ASA Grade Assessment: II - A patient with mild                         systemic disease.                        - After reviewing the risks and benefits, the patient                         was deemed in satisfactory condition to undergo the                         procedure.  After obtaining informed consent, the colonoscope was                         passed under direct vision. Throughout the procedure,                         the patient's blood pressure, pulse, and oxygen                         saturations were monitored continuously. The                         Colonoscope was  introduced through the anus and                         advanced to the the cecum, identified by the                         appendiceal orifice. The colonoscopy was performed                         with ease. The patient tolerated the procedure well.                         The quality of the bowel preparation was excellent.                         The ileocecal valve, appendiceal orifice, and rectum                         were photographed. Findings:      The perianal and digital rectal examinations were normal.      A 3 mm polyp was found in the descending colon. The polyp was sessile.       The polyp was removed with a jumbo cold forceps. Resection and retrieval       were complete.      A few small-mouthed diverticula were found in the right colon.      The exam was otherwise without abnormality on direct and retroflexion       views. Impression:            - One 3 mm polyp in the descending colon, removed with                         a jumbo cold forceps. Resected and retrieved.                        - Diverticulosis in the right colon.                        - The examination was otherwise normal on direct and                         retroflexion views. Recommendation:        - Discharge patient to home (with escort).                        - Resume previous diet.                        -  Continue present medications.                        - Await pathology results.                        - Repeat colonoscopy in 7 years for surveillance. Procedure Code(s):     --- Professional ---                        [phone], Colonoscopy, flexible; with biopsy, single or                         multiple Diagnosis Code(s):     --- Professional ---                        Z86.010, Personal history of colonic polyps                        D12.4, Benign neoplasm of descending colon                        K57.30, Diverticulosis of large intestine without                         perforation or  abscess without bleeding CPT copyright 2022 [employer]. All rights reserved. The codes documented in this report are preliminary and upon coder review may  be revised to meet current compliance requirements. Ruel Kung, MD Ruel Kung MD, MD 07/01/2023 8:54:19 AM This report has been signed electronically. Number of Addenda: 0 Note Initiated On: 07/01/2023 8:36 AM Scope Withdrawal Time: 0 hours 10 minutes 53 seconds  Total Procedure Duration: 0 hours 13 minutes 4 seconds  Estimated Blood Loss:  Estimated blood loss: none.      [facility]

## 2023-07-01 NOTE — H&P (Signed)
 Ruel Kung, MD [address], [place], [place], [address] [address], [place], [place], [address] Phone: [phone]  Fax: [phone]  Primary Care Physician:  Weyman Bright, MD   Pre-Procedure History & Physical: HPI:  Jenny Lee is a 67 y.o. female is here for an colonoscopy.   Past Medical History:  Diagnosis Date   Anxiety    a.) on BZO (clonazepam ) PRN   Atypical angina (HCC)    CAD (coronary artery disease) 11/2000   a.) s/p NSTEMI 11/2000 --> LHC 12/22/2000 --> 100% mPDA -->  no PCI placed; SCAD of dLAD; b.) LHC 04/26/2003: normal coronaries; c.) LHC 11/26/2015: EF 35%, anteroapical aneurysm, normal coronary arteries; d.) cCTA 11/12/2022: Ca2+ score 0   Chronic pain syndrome    Diastolic dysfunction    a.) TTE 09/12/2022: EF 60-65%, AoV sclerosis, mild MR/AR, G1DD   Diverticulosis    Dyspnea    Female genuine stress incontinence 08/15/2014   GERD (gastroesophageal reflux disease)    H. pylori infection    History of 2019 novel coronavirus disease (COVID-19) 05/21/2020   HLD (hyperlipidemia)    HTN (hypertension)    Insomnia    a.) takes melatonin   Kidney cysts    Mastalgia 10/06/2012   NSTEMI (non-ST elevated myocardial infarction) (HCC) 11/2000   a.) LHC 12/22/2000 --> 100% mPDA --> no PCI (stents) placed.  Procedure complicated by (+) SCAD of dLAD with resulting ST elevation --> IABP placed and patient transferred to [facility]   Obstructive sleep apnea on CPAP    Paroxysmal SVT (supraventricular tachycardia) (HCC)    a. on sotalol  - followed by [facility] EP.   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 02/2017 s/p PTA of RRA.   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   Takotsubo  cardiomyopathy    a.) LHC 11/26/2015: EF 35%; b.) TTE 09/12/2022: EF 60-65%   Vitamin D  deficiency     Past Surgical History:  Procedure Laterality Date   ABDOMINAL HYSTERECTOMY  05/26/2002   CARDIAC CATHETERIZATION N/A 11/26/2015   Procedure: Left Heart Cath and Coronary Angiography;  Surgeon: Denyse DELENA Bathe, MD;  Location: [facility] INVASIVE CV LAB;  Service: Cardiovascular;  Laterality: N/A;   CERVICAL SPINE SURGERY  05/26/1996   fusion   CLAVICLE SURGERY Right    shaved bone   COLONOSCOPY WITH PROPOFOL  N/A 12/23/2016   Procedure: COLONOSCOPY WITH PROPOFOL ;  Surgeon: Kung Ruel, MD;  Location: [facility] ENDOSCOPY;  Service: Endoscopy;  Laterality: N/A;   COLONOSCOPY WITH PROPOFOL  N/A 06/26/2017   Procedure: COLONOSCOPY WITH PROPOFOL ;  Surgeon: Janalyn Keene NOVAK, MD;  Location: [facility] ENDOSCOPY;  Service: Gastroenterology;  Laterality: N/A;   CORONARY BALLOON ANGIOPLASTY  11/2000   ESOPHAGOGASTRODUODENOSCOPY (EGD) WITH PROPOFOL  N/A 12/23/2016   Procedure: ESOPHAGOGASTRODUODENOSCOPY (EGD) WITH PROPOFOL ;  Surgeon: Kung Ruel, MD;  Location: [facility] ENDOSCOPY;  Service: Endoscopy;  Laterality: N/A;   ESOPHAGOGASTRODUODENOSCOPY (EGD) WITH PROPOFOL  N/A 03/31/2017   Procedure: ESOPHAGOGASTRODUODENOSCOPY (EGD) WITH PROPOFOL  WITH GASTRIC MAPPING;  Surgeon: Kung Ruel, MD;  Location: [facility] ENDOSCOPY;  Service: Gastroenterology;  Laterality: N/A;   ESOPHAGOGASTRODUODENOSCOPY (EGD) WITH PROPOFOL  N/A 12/21/2017   Procedure: ESOPHAGOGASTRODUODENOSCOPY (EGD) WITH PROPOFOL ;  Surgeon: Kung Ruel, MD;  Location: [facility] ENDOSCOPY;  Service: Gastroenterology;  Laterality:  N/A;   LEFT HEART CATH AND CORONARY ANGIOGRAPHY Left 04/26/2003   Procedure: LEFT HEART CATH AND CORONARY ANGIOGRAPHY; Location: [facility]; Surgeon: Margie Lovelace, MD   RENAL ANGIOGRAPHY Bilateral 03/02/2017   Procedure: RENAL ANGIOGRAPHY;  Surgeon: Marea Selinda RAMAN, MD;  Location: [facility] INVASIVE CV LAB;  Service: Cardiovascular;  Laterality: Bilateral;    VAGINAL PROLAPSE REPAIR      Prior to Admission medications   Medication Sig Start Date End Date Taking? Authorizing Provider  aspirin  81 MG tablet Take 81 mg by mouth daily.   Yes [provider]  celecoxib (CELEBREX) 100 MG capsule Take 100 mg by mouth 2 (two) times daily. 06/25/21  Yes [provider]  cetirizine (ZYRTEC) 10 MG tablet Take 10 mg by mouth daily.   Yes [provider]  clonazePAM  (KLONOPIN ) 0.5 MG tablet Take 1 tablet (0.5 mg total) by mouth 2 (two) times daily for 5 days. Patient taking differently: Take 0.5 mg by mouth daily. 09/13/22  Yes Britta Lacks, MD  diltiazem  (CARDIZEM  CD) 120 MG 24 hr capsule Take 120 mg by mouth daily. 06/02/22  Yes [provider]  gabapentin  (NEURONTIN ) 300 MG capsule Take by mouth. Take 1 tablet am and 3 tablets pm daily. 03/25/18  Yes [provider]  isosorbide  mononitrate (IMDUR ) 30 MG 24 hr tablet TAKE 1 TABLET BY MOUTH TWICE A DAY 05/07/23  Yes Fernand Denyse LABOR, MD  pantoprazole  (PROTONIX ) 40 MG tablet 40 mg as needed. 08/28/17  Yes [provider]  ranolazine  (RANEXA ) 1000 MG SR tablet Take 1 tablet (1,000 mg total) by mouth 2 (two) times daily. 09/13/22  Yes Masoud, Lacks, MD  rosuvastatin  (CRESTOR ) 40 MG tablet TAKE 1 TABLET BY MOUTH EVERY DAY 02/27/23  Yes Orlean Alan HERO, FNP  acetaminophen  (TYLENOL ) 500 MG tablet Take 2 tablets (1,000 mg total) by mouth every 6 (six) hours as needed for mild pain. 11/20/22   Desiderio Schanz, MD  DULoxetine (CYMBALTA) 30 MG capsule Take 30 mg by mouth daily.    [provider]  enalapril  (VASOTEC ) 20 MG tablet Take by mouth. Patient not taking: Reported on 07/01/2023 03/22/18   [provider]  estradiol  (ESTRACE ) 0.5 MG tablet TAKE 1 TABLET BY MOUTH EVERY DAY Patient not taking: Reported on 07/01/2023 05/18/23   Orlean Alan HERO, FNP  furosemide  (LASIX ) 20 MG tablet Take 10 mg by mouth daily as needed. 01/31/22   [provider]   Melatonin 10 MG TABS Take 10 mg by mouth at bedtime.     [provider]  metoprolol  succinate (TOPROL -XL) 50 MG 24 hr tablet Take 50 mg by mouth daily. Patient not taking: Reported on 06/18/2023 06/02/22   [provider]  ondansetron  (ZOFRAN ) 4 MG tablet Take 1 tablet (4 mg total) by mouth every 8 (eight) hours as needed for nausea. 11/11/22   Schulz, Zachary R, PA-C  sotalol  (BETAPACE ) 80 MG tablet Take 1 tablet by mouth daily. 01/28/22   [provider]  tiZANidine  (ZANAFLEX ) 2 MG tablet Take 2 mg by mouth every 8 (eight) hours as needed. 08/19/22   [provider]  Vitamin D , Ergocalciferol , (DRISDOL ) 1.25 MG (50000 UNIT) CAPS capsule TAKE ONE CAPSULE BY MOUTH EVERY WEEK 04/08/23   Fernand Fredy RAMAN, MD    Allergies as of 05/18/2023 - Review Complete 12/19/2022  Allergen Reaction Noted   No known allergies Other (See Comments) 09/08/2022    Family History  Problem Relation Age of Onset   Hypertension Mother  Breast cancer Sister 54    Social History   Socioeconomic History   Marital status: Married    Spouse name: Masoud   Number of children: 4   Years of education: Not on file   Highest education level: Not on file  Occupational History   Not on file  Tobacco Use   Smoking status: Never   Smokeless tobacco: Never  Vaping Use   Vaping status: Never Used  Substance and Sexual Activity   Alcohol use: Not Currently    Comment: occasional wine   Drug use: No   Sexual activity: Yes  Other Topics Concern   Not on file  Social History Narrative   Lives at home with husband.   Social Drivers of Health   Financial Resource Strain: Medium Risk (07/12/2021)   Received from [facility], [facility]   Overall Financial Resource Strain (CARDIA)    Difficulty of Paying Living Expenses: Somewhat hard  Food Insecurity: No Food Insecurity (09/12/2022)   Hunger Vital Sign    Worried About Running Out of Food in the Last Year: Never true     Ran Out of Food in the Last Year: Never true  Transportation Needs: No Transportation Needs (09/12/2022)   PRAPARE - Administrator, Civil Service (Medical): No    Lack of Transportation (Non-Medical): No  Physical Activity: Insufficiently Active (07/12/2021)   [facility], [facility]   Exercise Vital Sign    Days of Exercise per Week: 3 days    Minutes of Exercise per Session: 30 min  Stress: Stress Concern Present (07/12/2021)   Received from [facility] - Occupational Stress Questionnaire    Feeling of Stress : Very much  Social Connections: Moderately Isolated (07/12/2021)   Received from [facility], [facility]   Social Connection and Isolation Panel [NHANES]    Frequency of Communication with Friends and Family: More than three times a week    Frequency of Social Gatherings with Friends and Family: More than three times a week    Attends Religious Services: Never    Database Administrator or Organizations: No    Attends Banker Meetings: Never    Marital Status: Married  Catering Manager Violence: Not At Risk (09/12/2022)   Humiliation, Afraid, Rape, and Kick questionnaire    Fear of Current or Ex-Partner: No    Emotionally Abused: No    Physically Abused: No    Sexually Abused: No    Review of Systems: See HPI, otherwise negative ROS  Physical Exam: BP 134/81   Pulse 69   Temp (!) 97 F (36.1 C) (Temporal)   Resp 18   Ht 5' 5 (1.651 m)   Wt 78.5 kg   BMI 28.79 kg/m  General:   Alert,  pleasant and cooperative in NAD Head:  Normocephalic and atraumatic. Neck:  Supple; no masses or thyromegaly. Lungs:  Clear throughout to auscultation, normal respiratory effort.    Heart:  +S1, +S2, Regular rate and rhythm, No edema. Abdomen:  Soft, nontender and nondistended. Normal bowel sounds, without guarding, and without rebound.   Neurologic:  Alert and   oriented x4;  grossly normal neurologically.  Impression/Plan: Jenny Lee is here for an colonoscopy to be performed for surveillance due to prior history of colon polyps   Risks, benefits, limitations, and alternatives regarding  colonoscopy have been reviewed  with the patient.  Questions have been answered.  All parties agreeable.   Ruel Kung, MD  07/01/2023, 8:26 AM

## 2023-07-01 NOTE — Anesthesia Postprocedure Evaluation (Signed)
 Anesthesia Post Note  Patient: Jenny Lee  Procedure(s) Performed: COLONOSCOPY WITH PROPOFOL   Patient location during evaluation: Endoscopy Anesthesia Type: General Level of consciousness: awake and alert Pain management: pain level controlled Vital Signs Assessment: post-procedure vital signs reviewed and stable Respiratory status: spontaneous breathing, nonlabored ventilation, respiratory function stable and patient connected to nasal cannula oxygen Cardiovascular status: blood pressure returned to baseline and stable Postop Assessment: no apparent nausea or vomiting Anesthetic complications: no   No notable events documented.   Last Vitals:  Vitals:   07/01/23 0905 07/01/23 0915  BP: (!) 143/68 (!) 153/83  Pulse: 69 (!) 59  Resp: 16 19  Temp:    SpO2: 99% 99%    Last Pain:  Vitals:   07/01/23 0915  TempSrc:   PainSc: 0-No pain                 Fairy POUR Taria Castrillo

## 2023-07-01 NOTE — Anesthesia Preprocedure Evaluation (Signed)
Anesthesia Evaluation  Patient identified by MRN, date of birth, ID band Patient awake    Reviewed: Allergy & Precautions, NPO status , Patient's Chart, lab work & pertinent test results  History of Anesthesia Complications Negative for: history of anesthetic complications  Airway Mallampati: III  TM Distance: <3 FB Neck ROM: full    Dental  (+) Chipped   Pulmonary neg shortness of breath, sleep apnea    Pulmonary exam normal        Cardiovascular Exercise Tolerance: Good hypertension, (-) angina + CAD, + Past MI and + Peripheral Vascular Disease  Normal cardiovascular exam+ dysrhythmias      Neuro/Psych   Anxiety     negative neurological ROS  negative psych ROS   GI/Hepatic Neg liver ROS,GERD  Controlled,,  Endo/Other  negative endocrine ROS    Renal/GU Renal disease  negative genitourinary   Musculoskeletal   Abdominal   Peds  Hematology negative hematology ROS (+)   Anesthesia Other Findings Past Medical History: No date: Anxiety     Comment:  a.) on BZO (clonazepam) PRN No date: Atypical angina (HCC) 11/2000: CAD (coronary artery disease)     Comment:  a.) s/p NSTEMI 11/2000 --> LHC 12/22/2000 --> 100% mPDA               -->  no PCI placed; SCAD of dLAD; b.) LHC 04/26/2003:               normal coronaries; c.) LHC 11/26/2015: EF 35%,               anteroapical aneurysm, normal coronary arteries; d.) cCTA              11/12/2022: Ca2+ score 0 No date: Chronic pain syndrome No date: Diastolic dysfunction     Comment:  a.) TTE 09/12/2022: EF 60-65%, AoV sclerosis, mild               MR/AR, G1DD No date: Diverticulosis No date: Dyspnea 08/15/2014: Female genuine stress incontinence No date: GERD (gastroesophageal reflux disease) No date: H. pylori infection 05/21/2020: History of 2019 novel coronavirus disease (COVID-19) No date: HLD (hyperlipidemia) No date: HTN (hypertension) No date: Insomnia      Comment:  a.) takes melatonin No date: Kidney cysts 10/06/2012: Mastalgia 11/2000: NSTEMI (non-ST elevated myocardial infarction) (HCC)     Comment:  a.) LHC 12/22/2000 --> 100% mPDA --> no PCI (stents)               placed.  Procedure complicated by (+) SCAD of dLAD with               resulting ST elevation --> IABP placed and patient               transferred to [facility] No date: Obstructive sleep apnea on CPAP No date: Paroxysmal SVT (supraventricular tachycardia) (HCC)     Comment:  a. on sotalol - followed by [facility] EP. No date: Prediabetes 08/15/2014: Prolapse of vaginal vault after hysterectomy No date: Renal artery stenosis (HCC)     Comment:  a. 02/2017 s/p PTA of RRA. 12/22/2000: Spontaneous dissection of coronary artery     Comment:  a.) LHC 12/22/2000 --> procedure complicated by (+) SCAD              of the dLAD --> developed ST elevations --> IABP placed               and patient transferred to [facility] --> no further  Tx               required. No date: Takotsubo cardiomyopathy     Comment:  a.) LHC 11/26/2015: EF 35%; b.) TTE 09/12/2022: EF               60-65% No date: Vitamin D deficiency  Past Surgical History: 05/26/2002: ABDOMINAL HYSTERECTOMY 11/26/2015: CARDIAC CATHETERIZATION; N/A     Comment:  Procedure: Left Heart Cath and Coronary Angiography;                Surgeon: Laurier Nancy, MD;  Location: [facility] INVASIVE CV               LAB;  Service: Cardiovascular;  Laterality: N/A; 05/26/1996: CERVICAL SPINE SURGERY     Comment:  fusion No date: CLAVICLE SURGERY; Right     Comment:  shaved bone 12/23/2016: COLONOSCOPY WITH PROPOFOL; N/A     Comment:  Procedure: COLONOSCOPY WITH PROPOFOL;  Surgeon: [name], MD;  Location: [facility] ENDOSCOPY;  Service:               Endoscopy;  Laterality: N/A; 06/26/2017: COLONOSCOPY WITH PROPOFOL; N/A     Comment:  Procedure: COLONOSCOPY WITH PROPOFOL;  Surgeon:               Pasty Spillers, MD;  Location: [facility]  ENDOSCOPY;                Service: Gastroenterology;  Laterality: N/A; 11/2000: CORONARY BALLOON ANGIOPLASTY 12/23/2016: ESOPHAGOGASTRODUODENOSCOPY (EGD) WITH PROPOFOL; N/A     Comment:  Procedure: ESOPHAGOGASTRODUODENOSCOPY (EGD) WITH               PROPOFOL;  Surgeon: [name], MD;  Location: [facility]               ENDOSCOPY;  Service: Endoscopy;  Laterality: N/A; 03/31/2017: ESOPHAGOGASTRODUODENOSCOPY (EGD) WITH PROPOFOL; N/A     Comment:  Procedure: ESOPHAGOGASTRODUODENOSCOPY (EGD) WITH               PROPOFOL WITH GASTRIC MAPPING;  Surgeon: [name], MD;              Location: [facility] ENDOSCOPY;  Service: Gastroenterology;                Laterality: N/A; 12/21/2017: ESOPHAGOGASTRODUODENOSCOPY (EGD) WITH PROPOFOL; N/A     Comment:  Procedure: ESOPHAGOGASTRODUODENOSCOPY (EGD) WITH               PROPOFOL;  Surgeon: [name], MD;  Location[facility]               ENDOSCOPY;  Service: Gastroenterology;  Laterality: N/A; 04/26/2003: LEFT HEART CATH AND CORONARY ANGIOGRAPHY; Left     Comment:  Procedure: LEFT HEART CATH AND CORONARY ANGIOGRAPHY;               Location: [facility]; Surgeon: Rudean Hitt, MD 03/02/2017: RENAL ANGIOGRAPHY; Bilateral     Comment:  Procedure: RENAL ANGIOGRAPHY;  Surgeon: Annice Needy,               MD;  Location: [facility] INVASIVE CV LAB;  Service:               Cardiovascular;  Laterality: Bilateral; No date: VAGINAL PROLAPSE REPAIR  BMI    Body Mass Index: 28.79 kg/m      Reproductive/Obstetrics negative OB ROS  Anesthesia Physical Anesthesia Plan  ASA: 3  Anesthesia Plan: General   Post-op Pain Management:    Induction: Intravenous  PONV Risk Score and Plan: Propofol infusion and TIVA  Airway Management Planned: Natural Airway and Nasal Cannula  Additional Equipment:   Intra-op Plan:   Post-operative Plan:   Informed Consent: I have reviewed the patients History and Physical, chart, labs and  discussed the procedure including the risks, benefits and alternatives for the proposed anesthesia with the patient or authorized representative who has indicated his/her understanding and acceptance.     Dental Advisory Given  Plan Discussed with: Anesthesiologist, CRNA and Surgeon  Anesthesia Plan Comments: (Patient consented for risks of anesthesia including but not limited to:  - adverse reactions to medications - risk of airway placement if required - damage to eyes, teeth, lips or other oral mucosa - nerve damage due to positioning  - sore throat or hoarseness - Damage to heart, brain, nerves, lungs, other parts of body or loss of life  Patient voiced understanding and assent.)       Anesthesia Quick Evaluation

## 2023-07-02 ENCOUNTER — Encounter: Payer: Self-pay | Admitting: Gastroenterology

## 2023-07-02 LAB — SURGICAL PATHOLOGY

## 2023-07-22 ENCOUNTER — Other Ambulatory Visit: Payer: Self-pay | Admitting: Internal Medicine

## 2023-07-22 DIAGNOSIS — R9431 Abnormal electrocardiogram [ECG] [EKG]: Secondary | ICD-10-CM

## 2023-07-22 DIAGNOSIS — R0609 Other forms of dyspnea: Secondary | ICD-10-CM

## 2023-07-29 ENCOUNTER — Encounter: Payer: Self-pay | Admitting: Gastroenterology

## 2023-08-17 ENCOUNTER — Ambulatory Visit (INDEPENDENT_AMBULATORY_CARE_PROVIDER_SITE_OTHER)

## 2023-08-17 DIAGNOSIS — R9431 Abnormal electrocardiogram [ECG] [EKG]: Secondary | ICD-10-CM | POA: Diagnosis not present

## 2023-08-17 DIAGNOSIS — R0609 Other forms of dyspnea: Secondary | ICD-10-CM | POA: Diagnosis not present

## 2023-08-24 MED ORDER — TECHNETIUM TC 99M SESTAMIBI GENERIC - CARDIOLITE
31.1000 | Freq: Once | INTRAVENOUS | Status: AC | PRN
  Administered 2023-08-24: 31.1 via INTRAVENOUS

## 2023-08-24 MED ORDER — TECHNETIUM TC 99M SESTAMIBI GENERIC - CARDIOLITE
10.8000 | Freq: Once | INTRAVENOUS | Status: AC | PRN
  Administered 2023-08-24: 10.8 via INTRAVENOUS

## 2023-09-02 ENCOUNTER — Encounter: Payer: Self-pay | Admitting: Medical

## 2023-09-02 ENCOUNTER — Ambulatory Visit: Attending: Medical | Admitting: Medical

## 2023-09-02 VITALS — BP 110/76 | HR 62 | Ht 65.0 in | Wt 166.2 lb

## 2023-09-02 DIAGNOSIS — I471 Supraventricular tachycardia, unspecified: Secondary | ICD-10-CM

## 2023-09-02 DIAGNOSIS — I2542 Coronary artery dissection: Secondary | ICD-10-CM

## 2023-09-02 DIAGNOSIS — R0600 Dyspnea, unspecified: Secondary | ICD-10-CM

## 2023-09-02 DIAGNOSIS — Z79899 Other long term (current) drug therapy: Secondary | ICD-10-CM

## 2023-09-02 DIAGNOSIS — I10 Essential (primary) hypertension: Secondary | ICD-10-CM | POA: Diagnosis not present

## 2023-09-02 NOTE — Patient Instructions (Signed)
 Medication Instructions:   Your Physician recommend you continue on your current medication as directed.     *If you need a refill on your cardiac medications before your next appointment, please call your pharmacy*  Lab Work: Your provider would like for you to have following labs drawn today CBC, TSH and CMP.   If you have labs (blood work) drawn today and your tests are completely normal, you will receive your results only by: MyChart Message (if you have MyChart) OR A paper copy in the mail If you have any lab test that is abnormal or we need to change your treatment, we will call you to review the results.  Testing/Procedures: Your physician has requested that you have an echocardiogram. Echocardiography is a painless test that uses sound waves to create images of your heart. It provides your doctor with information about the size and shape of your heart and how well your heart's chambers and valves are working.   You may receive an ultrasound enhancing agent through an IV if needed to better visualize your heart during the echo. This procedure takes approximately one hour.  There are no restrictions for this procedure.  This will take place at [address] [facility] [address], [place] [address]  Please note: We ask at that you not bring children with you during ultrasound (echo/ vascular) testing. Due to room size and safety concerns, children are not allowed in the ultrasound rooms during exams. Our front office staff cannot provide observation of children in our lobby area while testing is being conducted. An adult accompanying a patient to their appointment will only be allowed in the ultrasound room at the discretion of the ultrasound technician under special circumstances. We apologize for any inconvenience.   Follow-Up: At [facility], you and your health needs are our priority.  As part of our continuing mission to provide you with exceptional heart  care, our providers are all part of one team.  This team includes your primary Cardiologist (physician) and Advanced Practice Providers or APPs (Physician Assistants and Nurse Practitioners) who all work together to provide you with the care you need, when you need it.  Your next appointment:   6 week(s)  Provider:   You may see Lorine Bears, MD or one of the following Advanced Practice Providers on your designated Care Team:   Nicolasa Ducking, NP Ames Dura, PA-C Eula Listen, PA-C Cadence Fairfield, PA-C Charlsie Quest, NP Carlos Levering, NP    We recommend signing up for the patient portal called "MyChart".  Sign up information is provided on this After Visit Summary.  MyChart is used to connect with patients for Virtual Visits (Telemedicine).  Patients are able to view lab/test results, encounter notes, upcoming appointments, etc.  Non-urgent messages can be sent to your provider as well.   To learn more about what you can do with MyChart, go to [email or web address].

## 2023-09-02 NOTE — Progress Notes (Signed)
  Cardiology Office Note:  .   Date:  09/02/2023  ID:  Jenny Lee, DOB [birth year], MRN [id number] PCP: Sherrie Mustache, MD  [facility] Providers Cardiologist:  Lorine Bears, MD     History of Present Illness: .   Jenny Lee is a 68 y.o. female with a h/o SCAD, HTN, HLD, diastolic dysfunction, pSVT, sleep apnea and renal artery stenosis s/p PTA in 2018 who presents for follow-up.   She has a h/o SVT diagnosed in 2023 by a heart monitor followed by [facility]. She underwent ablation 03/2023 but had recurrent palpitations. She recently underwent ILR placement in the setting of ongoing tachycardia in the setting of sotalol and diltiazem.  She was last seen 05/2023 for chest pain. Symptoms resolved with Klonopin.   Today, she reports no further chest pain. She had the loop recorder implanted. She has occasional SOB, seems this is worse. Said she had a treadmill myoview 2 weeks ago at [facility] that was normal.   Studies Reviewed: .        Cardiac CT 10/2022   IMPRESSION: 1. Coronary calcium score of 0.   2. Normal coronary origin with right dominance.   3. No evidence of CAD.   4. CAD-RADS 0. Consider non-atherosclerotic causes of chest pain.  Echo 08/2022 1. Left ventricular ejection fraction, by estimation, is 60 to 65%. The  left ventricle has normal function. The left ventricle has no regional  wall motion abnormalities. Left ventricular diastolic parameters are  consistent with Grade I diastolic  dysfunction (impaired relaxation).   2. Right ventricular systolic function is normal. The right ventricular  size is normal. There is normal pulmonary artery systolic pressure.   3. The mitral valve is normal in structure. Mild mitral valve  regurgitation. No evidence of mitral stenosis.   4. The aortic valve is normal in structure. Aortic valve regurgitation is  mild. Aortic valve sclerosis/calcification is present, without any  evidence of aortic stenosis.   5. The inferior  vena cava is normal in size with greater than 50%  respiratory variability, suggesting right atrial pressure of 3 mmHg.       Physical Exam:   VS:  BP 110/76   Pulse 62   Ht 5\' 5"  (1.651 m)   Wt 166 lb 3.2 oz (75.4 kg)   SpO2 98%   BMI 27.66 kg/m    Wt Readings from Last 3 Encounters:  09/02/23 166 lb 3.2 oz (75.4 kg)  07/01/23 173 lb (78.5 kg)  06/18/23 170 lb (77.1 kg)    GEN: Well nourished, well developed in no acute distress NECK: No JVD; No carotid bruits CARDIAC: RRR, no murmurs, rubs, gallops RESPIRATORY:  Clear to auscultation without rales, wheezing or rhonchi  ABDOMEN: Soft, non-tender, non-distended EXTREMITIES:  No edema; No deformity   ASSESSMENT AND PLAN: .    H/o SCAD/SOB The patient denies chest pain, but feels she has been more SOB. She said [facility] did a Myoview treadmill test 2 weeks ago that was normal. She is euvolemic on exam. I will check CBC, BMET and TSH. I will update an echocardiogram.   pSVT s/p ablation 2024 She is s/p ILR for tachycardia on Sotalol and diltiazem. This is followed by [facility] EP.  HTN BP is normal today, continue current medications.        Dispo: Follow-up in 2 months  Signed, Juriel Cid David Stall, PA-C

## 2023-09-03 LAB — CBC
Hematocrit: 38.2 % (ref 34.0–46.6)
Hemoglobin: 12.6 g/dL (ref 11.1–15.9)
MCH: 28.8 pg (ref 26.6–33.0)
MCHC: 33 g/dL (ref 31.5–35.7)
MCV: 87 fL (ref 79–97)
Platelets: 287 10*3/uL (ref 150–450)
RBC: 4.37 x10E6/uL (ref 3.77–5.28)
RDW: 13.5 % (ref 11.7–15.4)
WBC: 8.5 10*3/uL (ref 3.4–10.8)

## 2023-09-03 LAB — COMPREHENSIVE METABOLIC PANEL WITH GFR
ALT: 7 IU/L (ref 0–32)
AST: 21 IU/L (ref 0–40)
Albumin: 4.3 g/dL (ref 3.9–4.9)
Alkaline Phosphatase: 85 IU/L (ref 44–121)
BUN/Creatinine Ratio: 13 (ref 12–28)
BUN: 13 mg/dL (ref 8–27)
Bilirubin Total: 0.2 mg/dL (ref 0.0–1.2)
CO2: 24 mmol/L (ref 20–29)
Calcium: 9.2 mg/dL (ref 8.7–10.3)
Chloride: 100 mmol/L (ref 96–106)
Creatinine, Ser: 1.02 mg/dL — ABNORMAL HIGH (ref 0.57–1.00)
Globulin, Total: 2.3 g/dL (ref 1.5–4.5)
Glucose: 104 mg/dL — ABNORMAL HIGH (ref 70–99)
Potassium: 5.1 mmol/L (ref 3.5–5.2)
Sodium: 136 mmol/L (ref 134–144)
Total Protein: 6.6 g/dL (ref 6.0–8.5)
eGFR: 61 mL/min/{1.73_m2} (ref >59)

## 2023-09-03 LAB — TSH: TSH: 0.893 u[IU]/mL (ref 0.450–4.500)

## 2023-09-16 ENCOUNTER — Ambulatory Visit: Payer: PPO | Admitting: Nurse Practitioner

## 2023-09-17 ENCOUNTER — Other Ambulatory Visit: Payer: Self-pay | Admitting: Cardiovascular Disease

## 2023-09-28 ENCOUNTER — Ambulatory Visit: Attending: Medical

## 2023-09-28 DIAGNOSIS — R0600 Dyspnea, unspecified: Secondary | ICD-10-CM | POA: Diagnosis not present

## 2023-09-28 MED ORDER — PERFLUTREN LIPID MICROSPHERE
1.0000 mL | INTRAVENOUS | Status: AC | PRN
  Administered 2023-09-28: 3 mL via INTRAVENOUS

## 2023-09-29 LAB — ECHOCARDIOGRAM COMPLETE
AR max vel: 1.85 cm2
AV Area VTI: 1.85 cm2
AV Area mean vel: 1.86 cm2
AV Mean grad: 6 mmHg
AV Peak grad: 11.6 mmHg
Ao pk vel: 1.7 m/s
Area-P 1/2: 2.45 cm2
S' Lateral: 2.61 cm

## 2023-10-21 ENCOUNTER — Ambulatory Visit: Admitting: Medical

## 2023-10-21 ENCOUNTER — Other Ambulatory Visit: Payer: Self-pay | Admitting: Internal Medicine

## 2023-11-11 ENCOUNTER — Ambulatory Visit: Admitting: Medical

## 2023-11-12 NOTE — Telephone Encounter (Addendum)
 Patient called in and stated that her pcp advised her to move her Nephrology appointment up from August due to elevated potassium levels. I have rescheduled patients appointment at this time, and she is in agreement with plan.

## 2023-11-12 NOTE — Progress Notes (Signed)
 [facility] [address] [place], [place] [address]   Pharmacist Chronic Pain Medication Management Visit Summary  Assessment/Plan:  Jenny Lee is a 68 y.o. adult with a PMHx of arthritis, CAD, GERD, HTN, and MI who is being seen at the [facility] for neck pain consistent with cervical radiculopathy. She was evaluated by Orthopedics and is now s/p AC/sternoclavicular joint resection 2021. She was not a candidate for C5-6 ACDF as she had low-laying cervical myelopathy that is not her chief complaint. ENT also recommended conservative pain management. Cervical MRI on 02/01/21 demonstrated severe degenerative disc disease of the cervical spinal most severe at C4-C5 and C5-C6, with resultant severe spinal canal stenosis at C4-C5 with effacement of the thecal sac. Severe neural foraminal stenosis on the right at multiple levels. We are delivering comprehensive, continuous, longitudinal care for this patient with chronic pain.   1. Chronic pain syndrome   2. Low back pain of over 3 months duration     Today the patient reports unchanged low back pain. The patient reports pain is worse at night. She endorses benefit with her medications. She is not taking duloxetine daily due to being concerned about taking it with her other medications. The patient was counseled that duloxetine is safe to take nightly with gabapentin  and that it will provide more benefit with taken regularly. The patient expressed understanding. She states she has been doing back exercises and stretches and also walks. Today the patient's medications were refilled without changes.  - Gabapentin  900 mg nightly; refilled. - Continue celebrex sparingly - Continue cymbalta 30 mg daily; refilled. Patient to start taking nightly. - Continue at home exercises and stretching  - Continue Tylenol  OTC - Continue tizanidine  2 mg nightly PRN, uses sparingly.  The patient does  appear to be  utilizing pain medications appropriately and does  report that the medications do improve patient's quality of life and functionality level.  Return to clinic to see physician in 3 months. The patient is due for her annual physician visit.  Future Considerations: Muscle relaxant rotation Increase Cymbalta  Today I have prescribed: Requested Prescriptions   Signed Prescriptions Disp Refills  . gabapentin  (NEURONTIN ) 300 MG capsule 270 capsule 0    Sig: Take 3 capsules (900 mg total) by mouth nightly.  . DULoxetine (CYMBALTA) 30 MG capsule 90 capsule 0    Sig: Take 1 capsule (30 mg total) by mouth daily.    No orders of the defined types were placed in this encounter.   I spent a total of 15 minutes face to face with the patient delivering clinical care and providing education/counseling.  Medications reviewed in EPIC medication station and updated today by the clinical pharmacist practitioner.  I have personally consulted with Dr. Bari regarding Jenny Lee 's medication regimen prior to prescribing controlled substances today and they are in agreement with the plan.  I have reviewed the [facility] Medical Board statement on use of controlled substances for the treatment of pain as well as the CDC Guideline for Prescribing Opioids for Chronic Pain.  I have reviewed the [place] Controlled Substance Monitoring Database.  Altamese Miracle, PharmD, CPP ______________________________________________________________________  History of Present Illness:   Reason for Visit:  Medication management for Chronic Pain.  Attending Pain Physician/Last Visit Date:  Dr. Bari  on 11/14/22 Last Pain Visit Date: 07/29/23 Last Pain Visit Provider: Altamese Miracle, PharmD, CPP  Jenny Lee is a 68 y.o. adult who is  being followed at the [facility] for complaint of chronic pain localized to right SCM and neck.  At last visit, we advised the patient to start taking duloxetine at night due to  associated drowsiness and to obtain a pill organizer. The patient's medications were continued as previously prescribed.  Since last visit the patient reports pain is unchanged.  In regards to medications currently taken for pain management the patient is tolerating these medications well and complains of associated side effects ankle swelling, constipation, vomiting/nausea, and weight gain. Patient denies misuse, abuse or diversion of medications. Patient reports being stable on this medication regimen and thinks that the medications do improve patient's quality of life and do improve patient's functionality level. Patient reports that the patient is able to perform majority of ADLs on the current regimen.   Patient denies homicidal/suicidal ideation.  Current Medications - Gabapentin  900 mg nightly - Celebrex 100 mg daily prn - Cymbalta 30 mg nightly - Tizanidine  2 mg nightly  Previous Medication Trials: NSAIDS- mobic Antidepressants- zoloft   Neuroleptics- gabapentin  Muscle relaxants-  Topicals-  Short-acting opiates-  Long-acting opiates-  Anxiolytics- klonopin    Subjective:   Adverse Effects of Pain Medications:  Constipation:  no. Sedation:  no.  Reported Pain Scores: Worst:  8/10 Least:  5/10 Right Now:  4/10 Average over the past month: 8/10  Reported Description of Pain: Location:  left scapula, low back Character:  aching, sharp. Frequency:  all the times Pain is worst in: evenings. Pain negatively affects: Mood, normal work, walking, sitting and standing.  Reported Effectiveness of Pain Medications since last visit:   Patient documents that their pain stayed the same since their last visit.  They documented that they are stable on their current regimen and that the medications do help to improve the quality of their life.  Objective:   PAST MEDICAL HISTORY:   Active Ambulatory Problems    Diagnosis Date Noted  . H. pylori infection 11/20/2017  .  Constipation 12/08/2018  . Dysgeusia 12/09/2019  . Abdominal pain 12/09/2019  . Other specified arthritis, other site 01/24/2020  . SVT (supraventricular tachycardia) ([facility]CC) 02/02/2023   Resolved Ambulatory Problems    Diagnosis Date Noted  . No Resolved Ambulatory Problems   Past Medical History:  Diagnosis Date  . Allergic rhinitis   . Arthritis   . At risk for falls   . Coronary artery disease   . Dizziness   . Financial difficulties   . GERD (gastroesophageal reflux disease)   . Hypertension   . Hyponatremia   . Myocardial infarct, old 2005  . Myofascial pain 06/2022  . Visual impairment     Outpatient Encounter Medications as of 11/16/2023  Medication Sig Dispense Refill  . celecoxib (CELEBREX) 100 MG capsule Take 1 capsule (100 mg total) by mouth daily as needed for pain. 30 capsule 2  . cetirizine (ZYRTEC) 10 MG tablet     . dilTIAZem  (CARDIZEM  CD) 120 MG 24 hr capsule TAKE 1 CAPSULE BY MOUTH EVERY DAY 90 capsule 2  . docusate sodium  (COLACE) 100 MG capsule Take 1 capsule (100 mg total) by mouth in the morning.    . enalapril  (VASOTEC ) 20 MG tablet Take 1 tablet (20 mg total) by mouth.    . ergocalciferol  (DRISDOL ) 1,250 mcg (50,000 unit) capsule     . estradioL  (ESTRACE ) 0.5 MG tablet Take 1 tablet (0.5 mg total) by mouth.    . fluticasone propionate (FLONASE) 50 mcg/actuation nasal spray 1 spray into  each nostril in the morning.    . isosorbide  mononitrate (IMDUR ) 30 MG 24 hr tablet     . meclizine (ANTIVERT) 25 mg tablet Chew 1 tablet (25 mg total) Three (3) times a day as needed.    . melatonin 10 mg Tab Take 1 tablet (10 mg total) by mouth.    . metoPROLOL  succinate (TOPROL -XL) 50 MG 24 hr tablet TAKE 1 TABLET BY MOUTH EVERY DAY 90 tablet 3  . ondansetron  (ZOFRAN ) 4 MG tablet     . pantoprazole  (PROTONIX ) 40 MG tablet Take 1 tablet (40 mg total) by mouth.    . ranolazine  (RANEXA ) 500 MG 12 hr tablet Take 1 tablet (500 mg total) by mouth two (2) times a day.     . rosuvastatin  (CRESTOR ) 40 MG tablet Take 1 tablet (40 mg total) by mouth in the morning.    . sertraline  (ZOLOFT ) 100 MG tablet Take 1 tablet (100 mg total) by mouth.    . sotalol  (BETAPACE ) 80 MG tablet TAKE 1 TABLET BY MOUTH EVERY DAY 90 tablet 2  . tizanidine  (ZANAFLEX ) 2 MG tablet TAKE 1 TABLET BY MOUTH NIGHTLY AS NEEDED. 90 tablet 0  . [DISCONTINUED] DULoxetine (CYMBALTA) 30 MG capsule Take 1 capsule (30 mg total) by mouth daily. 90 capsule 0  . clonazePAM  (KLONOPIN ) 0.5 MG tablet Take 1 tablet (0.5 mg total) by mouth.    . DULoxetine (CYMBALTA) 30 MG capsule Take 1 capsule (30 mg total) by mouth daily. 90 capsule 0  . furosemide  (LASIX ) 20 MG tablet Take 0.5 tablets (10 mg total) by mouth.    . gabapentin  (NEURONTIN ) 300 MG capsule Take 3 capsules (900 mg total) by mouth nightly. 270 capsule 0  . [DISCONTINUED] gabapentin  (NEURONTIN ) 300 MG capsule Take 3 capsules (900 mg total) by mouth nightly. 270 capsule 0   No facility-administered encounter medications on file as of 11/16/2023.    Allergies No Known Allergies  Physical Examination: Vitals:  Vitals:   11/16/23 1337  BP: 108/70  BP Site: R Arm  BP Position: Sitting  Pulse: 60  Resp: 16  SpO2: 95%  Weight: 72.2 kg (159 lb 1.6 oz)  Height: 165.1 cm (5' 5)    General:  There is no evidence of sedation.  There are no overt pain behaviors observed.   Musculoskeletal:  Patient ambulates without an assistive device

## 2023-11-13 ENCOUNTER — Ambulatory Visit: Attending: Nurse Practitioner | Admitting: Nurse Practitioner

## 2023-11-13 ENCOUNTER — Encounter: Payer: Self-pay | Admitting: Nurse Practitioner

## 2023-11-13 VITALS — BP 118/70 | HR 67 | Ht 65.0 in | Wt 160.0 lb

## 2023-11-13 DIAGNOSIS — E785 Hyperlipidemia, unspecified: Secondary | ICD-10-CM | POA: Diagnosis not present

## 2023-11-13 DIAGNOSIS — I471 Supraventricular tachycardia, unspecified: Secondary | ICD-10-CM | POA: Diagnosis not present

## 2023-11-13 DIAGNOSIS — I10 Essential (primary) hypertension: Secondary | ICD-10-CM | POA: Diagnosis not present

## 2023-11-13 DIAGNOSIS — I5181 Takotsubo syndrome: Secondary | ICD-10-CM

## 2023-11-13 DIAGNOSIS — I2542 Coronary artery dissection: Secondary | ICD-10-CM

## 2023-11-13 DIAGNOSIS — I480 Paroxysmal atrial fibrillation: Secondary | ICD-10-CM

## 2023-11-13 MED ORDER — ISOSORBIDE MONONITRATE ER 30 MG PO TB24
30.0000 mg | ORAL_TABLET | Freq: Two times a day (BID) | ORAL | 1 refills | Status: AC
Start: 2023-11-13 — End: ?

## 2023-11-13 MED ORDER — RANOLAZINE ER 1000 MG PO TB12: 1000.0000 mg | ORAL_TABLET | Freq: Two times a day (BID) | ORAL | 1 refills | Status: AC

## 2023-11-13 NOTE — Patient Instructions (Signed)
 Medication Instructions:  No changes *If you need a refill on your cardiac medications before your next appointment, please call your pharmacy*  Lab Work: None ordered If you have labs (blood work) drawn today and your tests are completely normal, you will receive your results only by: MyChart Message (if you have MyChart) OR A paper copy in the mail If you have any lab test that is abnormal or we need to change your treatment, we will call you to review the results.  Testing/Procedures: None ordered  Follow-Up: At [facility], you and your health needs are our priority.  As part of our continuing mission to provide you with exceptional heart care, our providers are all part of one team.  This team includes your primary Cardiologist (physician) and Advanced Practice Providers or APPs (Physician Assistants and Nurse Practitioners) who all work together to provide you with the care you need, when you need it.  Your next appointment:   6 month(s)  Provider:   Antionette Kirks, MD    We recommend signing up for the patient portal called "MyChart".  Sign up information is provided on this After Visit Summary.  MyChart is used to connect with patients for Virtual Visits (Telemedicine).  Patients are able to view lab/test results, encounter notes, upcoming appointments, etc.  Non-urgent messages can be sent to your provider as well.   To learn more about what you can do with MyChart, go to [email or web address].

## 2023-11-13 NOTE — Progress Notes (Signed)
 Office Visit    Patient Name: Jenny Lee Date of Encounter: 11/13/2023  Primary Care Provider:  Annelle Kiel, MD Primary Cardiologist:  Antionette Kirks, MD  Chief Complaint    68 y.o. female   with a history of spontaneous coronary artery dissection, hypertension, hyperlipidemia, diastolic dysfunction, PSVT, PAF (noted during EP study 03/2023), sleep apnea, and renal artery stenosis status post PTA in 2018, who presents for cardiology follow-up.   Past Medical History   Subjective   Past Medical History:  Diagnosis Date   Anxiety    a.) on BZO (clonazepam ) PRN   Atypical angina (HCC)    CAD (coronary artery disease) 11/2000   a.) s/p NSTEMI 11/2000 --> LHC 12/22/2000 --> 100% mPDA -->  no PCI placed; SCAD of dLAD; b.) LHC 04/26/2003: normal coronaries; c.) LHC 11/26/2015: EF 35%, anteroapical aneurysm, normal coronary arteries; d.) cCTA 11/12/2022: Ca2+ score 0; e. 08/2023 reportedly nl stress test.   Chronic pain syndrome    Diastolic dysfunction    a.) TTE 09/12/2022: EF 60-65%, AoV sclerosis, mild MR/AR, G1DD; b. 09/2023 Echo: EF of 60 to 65%, grade 1 diastolic dysfunction, apical dyskinesis, normal RV function, mild AI and aortic sclerosis with raphe between the left and right cusp of the aortic valve making it functionally bicuspid/fusion.   Diverticulosis    Dyspnea    Female genuine stress incontinence 08/15/2014   GERD (gastroesophageal reflux disease)    H. pylori infection    History of 2019 novel coronavirus disease (COVID-19) 05/21/2020   HLD (hyperlipidemia)    HTN (hypertension)    Insomnia    a.) takes melatonin   Kidney cysts    Mastalgia 10/06/2012   NSTEMI (non-ST elevated myocardial infarction) (HCC) 11/2000   a.) LHC 12/22/2000 --> 100% mPDA --> no PCI (stents) placed.  Procedure complicated by (+) SCAD of dLAD with resulting ST elevation --> IABP placed and patient transferred to [facility]   Obstructive sleep apnea on CPAP    PAF (paroxysmal atrial  fibrillation) (HCC)    a. 03/2023 noted on EP study ([facility])-->s/p ILR 06/2023.   Paroxysmal SVT (supraventricular tachycardia) (HCC)    a. on sotalol  - followed by [facility] EP; b. 03/2023 - not induced on EP study.   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 02/2017 s/p PTA of RRA.   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   Takotsubo cardiomyopathy    a.) LHC 11/26/2015: EF 35%; b.) TTE 09/12/2022: EF 60-65%; c. EF of 60 to 65%, grade 1 diastolic dysfunction, apical dyskinesis, normal RV function.   Vitamin D  deficiency    Past Surgical History:  Procedure Laterality Date   ABDOMINAL HYSTERECTOMY  05/26/2002   CARDIAC CATHETERIZATION N/A 11/26/2015   Procedure: Left Heart Cath and Coronary Angiography;  Surgeon: Cherrie Cornwall, MD;  Location: [facility] INVASIVE CV LAB;  Service: Cardiovascular;  Laterality: N/A;   CERVICAL SPINE SURGERY  05/26/1996   fusion   CLAVICLE SURGERY Right    shaved bone   COLONOSCOPY WITH PROPOFOL  N/A 12/23/2016   Procedure: COLONOSCOPY WITH PROPOFOL ;  Surgeon: Luke Salaam, MD;  Location: [facility] ENDOSCOPY;  Service: Endoscopy;  Laterality: N/A;   COLONOSCOPY WITH PROPOFOL  N/A 06/26/2017   Procedure: COLONOSCOPY WITH PROPOFOL ;  Surgeon: Irby Mannan, MD;  Location: [facility] ENDOSCOPY;  Service: Gastroenterology;  Laterality: N/A;   COLONOSCOPY WITH PROPOFOL  N/A 07/01/2023   Procedure: COLONOSCOPY WITH PROPOFOL ;  Surgeon: Luke Salaam, MD;  Location: [facility] ENDOSCOPY;  Service: Gastroenterology;  Laterality: N/A;   CORONARY BALLOON ANGIOPLASTY  11/2000   ESOPHAGOGASTRODUODENOSCOPY (EGD) WITH PROPOFOL  N/A 12/23/2016   Procedure: ESOPHAGOGASTRODUODENOSCOPY (EGD) WITH PROPOFOL ;  Surgeon: Luke Salaam, MD;  Location: [facility] ENDOSCOPY;   Service: Endoscopy;  Laterality: N/A;   ESOPHAGOGASTRODUODENOSCOPY (EGD) WITH PROPOFOL  N/A 03/31/2017   Procedure: ESOPHAGOGASTRODUODENOSCOPY (EGD) WITH PROPOFOL  WITH GASTRIC MAPPING;  Surgeon: Luke Salaam, MD;  Location: [facility] ENDOSCOPY;  Service: Gastroenterology;  Laterality: N/A;   ESOPHAGOGASTRODUODENOSCOPY (EGD) WITH PROPOFOL  N/A 12/21/2017   Procedure: ESOPHAGOGASTRODUODENOSCOPY (EGD) WITH PROPOFOL ;  Surgeon: Luke Salaam, MD;  Location: [facility] ENDOSCOPY;  Service: Gastroenterology;  Laterality: N/A;   LEFT HEART CATH AND CORONARY ANGIOGRAPHY Left 04/26/2003   Procedure: LEFT HEART CATH AND CORONARY ANGIOGRAPHY; Location: [facility]; Surgeon: Thais Fill, MD   POLYPECTOMY  07/01/2023   Procedure: POLYPECTOMY;  Surgeon: Luke Salaam, MD;  Location: [facility] ENDOSCOPY;  Service: Gastroenterology;;   RENAL ANGIOGRAPHY Bilateral 03/02/2017   Procedure: RENAL ANGIOGRAPHY;  Surgeon: Celso College, MD;  Location: [facility] INVASIVE CV LAB;  Service: Cardiovascular;  Laterality: Bilateral;   VAGINAL PROLAPSE REPAIR      Allergies  Allergies  Allergen Reactions   No Known Allergies Other (See Comments)       History of Present Illness      68 y.o. y/o female with a history of spontaneous coronary artery dissection, hypertension, hyperlipidemia, diastolic dysfunction, PSVT on sotalol  followed by electrophysiology at [facility], PAF, sleep apnea, and renal artery stenosis status post PTA in 2018.  In 2002, she suffered a non-STEMI at the age of 43.  Echo showed normal LV function with apical wall motion abnormality.  Diagnostic catheterization showed an occluded RPDA with appearance highly suggestive of spontaneous coronary artery dissection.  She also had an abnormality in the mid to distal LAD likely representative of SCAD.  She required aortic balloon pump placement and was transferred to [facility] at that time and was managed medically without revascularization.  She underwent repeat catheterization by Dr. Meredeth Stallion in 2017,  which showed completely normal arteries with recanalization of the RPDA and normal appearance of the LAD.  There was persistent wall motion abnormality in the inferior apical region.  More recently, she has been followed by [facility] cardiology for PSVT, which has been managed with sotalol  therapy.  In April 2024, she presented to the emergency department with complaint of tachycardia and chest pain.  Troponin was minimally elevated.  Echo showed normal LV function without significant valvular abnormalities.  In June 2024, she underwent coronary CT angiogram which showed normal coronary arteries with a calcium  score of 0.  She continued to have intermittent abdominal discomfort and underwent cholecystectomy in June 2024.  In November 2024, she underwent electrophysiology study during which, she was noted to have short episode of sustained A-fib 3 short coupled PAC.  No other arrhythmias were induced and therefore, ablation was not performed.  She subsequently underwent implantable loop recorder placement in February 2025, to further document potential presence of atrial fibrillation.     Meilah Delrosario was last seen in general cardiology clinic in April 2025 at which time she reported some dyspnea.  She reported that her PCP had recently performed a stress test which was apparently normal.  An echocardiogram was performed and showed an EF of 60 to  65%, grade 1 diastolic dysfunction, apical dyskinesis, normal RV function, mild AI and aortic sclerosis with raphe between the left and right cusp of the aortic valve making it functionally bicuspid/fusion.  Since her last visit, Jenny Lee continues to note mild dyspnea on exertion which is stable for quite some time.  She denies chest pain, palpitations, PND, orthopnea, dizziness, syncope, edema, or early satiety.  She has a long history of intermittent nausea and has a prescription for Zofran  but was previously advised not to use in the setting of sotalol  therapy.   Objective   Home Medications    Current Outpatient Medications  Medication Sig Dispense Refill   acetaminophen  (TYLENOL ) 500 MG tablet Take 2 tablets (1,000 mg total) by mouth every 6 (six) hours as needed for mild pain.     aspirin  81 MG tablet Take 81 mg by mouth daily.     celecoxib (CELEBREX) 100 MG capsule Take 100 mg by mouth 2 (two) times daily.     cetirizine (ZYRTEC) 10 MG tablet Take 10 mg by mouth daily.     clonazePAM  (KLONOPIN ) 0.5 MG tablet Take 1 tablet (0.5 mg total) by mouth 2 (two) times daily for 5 days. (Patient taking differently: Take 0.5 mg by mouth daily.) 10 tablet 0   diltiazem  (CARDIZEM  CD) 120 MG 24 hr capsule Take 120 mg by mouth daily.     DULoxetine (CYMBALTA) 30 MG capsule Take 30 mg by mouth daily.     enalapril  (VASOTEC ) 20 MG tablet Take by mouth.     estradiol  (ESTRACE ) 0.5 MG tablet TAKE 1 TABLET BY MOUTH EVERY DAY 90 tablet 3   furosemide  (LASIX ) 20 MG tablet Take 10 mg by mouth daily as needed.     gabapentin  (NEURONTIN ) 300 MG capsule Take by mouth. Take 1 tablet am and 3 tablets pm daily.     Melatonin 10 MG TABS Take 10 mg by mouth at bedtime.      ondansetron  (ZOFRAN ) 4 MG tablet Take 1 tablet (4 mg total) by mouth every 8 (eight) hours as needed for nausea. 20 tablet 0   pantoprazole  (PROTONIX ) 40 MG tablet 40 mg as needed.  2   rosuvastatin  (CRESTOR ) 40 MG tablet TAKE 1 TABLET BY MOUTH EVERY DAY 90 tablet 3   sotalol  (BETAPACE ) 80 MG tablet Take 1 tablet by mouth daily.     tiZANidine  (ZANAFLEX ) 2 MG tablet Take 2 mg by mouth every 8 (eight) hours as needed.     Vitamin D , Ergocalciferol , (DRISDOL ) 1.25 MG (50000 UNIT) CAPS capsule TAKE ONE CAPSULE BY MOUTH EVERY WEEK 12 capsule 2   isosorbide  mononitrate (IMDUR ) 30 MG 24 hr tablet Take 1 tablet (30 mg total) by mouth 2 (two) times daily. 180 tablet 1   ranolazine  (RANEXA ) 1000 MG SR tablet Take 1 tablet (1,000 mg total) by mouth 2 (two) times daily. 180 tablet 1   No current  facility-administered medications for this visit.     Physical Exam    VS:  BP 118/70 (BP Location: Left Arm, Patient Position: Sitting, Cuff Size: Normal)   Pulse 67   Ht 5' 5 (1.651 m)   Wt 160 lb (72.6 kg)   SpO2 95%   BMI 26.63 kg/m  , BMI Body mass index is 26.63 kg/m.          GEN: Well nourished, well developed, in no acute distress. HEENT: normal. Neck: Supple, no JVD, carotid bruits, or masses. Cardiac: RRR, no murmurs, rubs, or gallops.  No clubbing, cyanosis, edema.  Radials 2+/PT 2+ and equal bilaterally.  Respiratory:  Respirations regular and unlabored, clear to auscultation bilaterally. GI: Soft, nontender, nondistended, BS + x 4. MS: no deformity or atrophy. Skin: warm and dry, no rash. Neuro:  Strength and sensation are intact. Psych: Normal affect.  Accessory Clinical Findings    ECG personally reviewed by me today - EKG Interpretation Date/Time:  Friday November 13 2023 11:01:15 EDT Ventricular Rate:  67 PR Interval:  154 QRS Duration:  76 QT Interval:  390 QTC Calculation: 412 R Axis:   30  Text Interpretation: Normal sinus rhythm Cannot rule out Anterior infarct (cited on or before 18-Jun-2023) Confirmed by Laneta Pintos ([id number]) on 11/13/2023 11:19:42 AM  - no acute changes.  Lab Results  Component Value Date   WBC 8.5 09/02/2023   HGB 12.6 09/02/2023   HCT 38.2 09/02/2023   MCV 87 09/02/2023   PLT 287 09/02/2023   Lab Results  Component Value Date   CREATININE 1.02 (H) 09/02/2023   BUN 13 09/02/2023   NA 136 09/02/2023   K 5.1 09/02/2023   CL 100 09/02/2023   CO2 24 09/02/2023   Lab Results  Component Value Date   ALT 7 09/02/2023   AST 21 09/02/2023   ALKPHOS 85 09/02/2023   BILITOT 0.2 09/02/2023   Lab Results  Component Value Date   CHOL 155 09/12/2022   HDL 54 09/12/2022   LDLCALC 91 09/12/2022   TRIG 52 09/12/2022   CHOLHDL 2.9 09/12/2022    Lab Results  Component Value Date   HGBA1C 6.2 (H) 11/26/2015   Lab  Results  Component Value Date   TSH 0.893 09/02/2023       Assessment & Plan    1.  History of spontaneous coronary artery dissection: Status post spontaneous coronary artery dissection in 2002 involving the LAD and RPDA.  Catheterization in 2017 showed normal coronary arteries as did CT angiogram in June of 2024.  She has a long history of intermittent chest pain and dyspnea though no chest pain on today's visit.  She has been managed with long-acting nitrate, Ranexa , aspirin , and statin therapy.  2.  PSVT: Followed closely by electrophysiology at [facility].  EP study in November 2024 did not show inducible SVT, but did show brief episode of atrial fibrillation.  She is now status post implantable loop recorder in February 2025 in an effort to assess for subclinical A-fib.  She denies any recent palpitations.  She remains on sotalol  therapy.  3.  Paroxysmal atrial fibrillation: Brief episode noted during EP study.  Status post implantable loop recorder to assess for subclinical atrial fibrillation.  She is followed closely at [facility].  CHA2DS2VASc equals 3 and she does not currently require anticoagulation given paucity of arrhythmia.  4.  Primary hypertension: Blood pressure stable at 118/70.  Is managed with diltiazem , nitrate, and low-dose Lasix .  She is no longer taking enalapril .  5.  Hyperlipidemia: She remains on rosuvastatin  therapy.  Lipids are followed by primary care.  LFTs were normal earlier this year.  6.  Right renal artery stenosis: Status post prior PTA.  Blood pressure normal.  Creatinine 1.02 in April.  7.  Takotsubo cardiomyopathy: In the setting of spontaneous coronary artery dissection in 2002.  Improved with stable/normal LV function with EF of 60 to 65% by recent echo.  She is no longer on beta-blocker or ACE inhibitor therapy.  She has chronic, mild dyspnea on exertion and is euvolemic on  low-dose Lasix .   8.  Disposition: Follow-up in general cardiology clinic in 6 months or  sooner if necessary.  Laneta Pintos, NP 11/13/2023, 12:05 PM

## 2023-11-17 NOTE — Progress Notes (Signed)
I have reviewed and agree with this note by Trudee Grip, PharmD, CPP.  I was the supervising physician of record.   Vance Gather, MD

## 2023-11-19 NOTE — Progress Notes (Signed)
 Patient Name: Jenny Lee, female   Patient DOB: 11-19-1955 Date of Service: 11/19/2023  Patient MRN: [id number] Provider Creating Note: Saralee Stank, MD  [phone] Primary Care Physician: Weyman Bright, MD PhD  [address] [place] [address] Additional Physicians/ Providers:     History of Present Illness Jenny Lee is a 68 y.o. female  Patient has following medical problems: history of anxiety GERD Hypertension Stress incontinence, history of vaginal prolapse. Osteoarthritis History of cervical spine, neck surgery Coronary disease/non-STEMI July 2002/Spontaneous dissection of coronary artery/History of Takotsubo cardiomyopathy/ Paroxysmal atrial fibrillation Obstructive sleep apnea Hysterectomy 2016 Renal artery stenosis status post angioplasty of right renal artery in October 2018 Colonoscopy February 2025 Severe hyperlipidemia ==========================================================  Patient is here for an urgent visit today because of high potassium noted in her labs at PCP office.  Labs drawn on 10/23/2023 were reviewed and are summarized below.  Creatinine is normal.  Potassium of 5.8.  She does not have any other complaints.  No shortness of breath or edema.  Blood pressure today is controlled at 124/75.  Weight is down by 7 pounds compared to last visit from April 2025.  The following portions of the patient's chart were reviewed in this encounter and updated as appropriate:  Tobacco  Allergies  Meds  Fam Hx        Medications   Current Outpatient Medications:  .  tiZANidine  (ZANAFLEX ) 2 MG tablet, Take 2 mg by mouth if needed, Disp: , Rfl:  .  acetaminophen  (TYLENOL ) 500 MG tablet, Take 500 mg by mouth every 6 (six) hours if needed for mild pain, Disp: , Rfl:  .  Aspirin  Buf,CaCarb-MgCarb-MgO, 81 MG tablet, Take 81 mg by mouth 1 (one) time each day, Disp: , Rfl:  .  celecoxib (CeleBREX) 100 MG capsule, Take 100 mg by mouth in the morning and 100 mg in  the evening., Disp: , Rfl:  .  clonazePAM  (KlonoPIN ) 0.5 MG tablet, Take 1 tablet by mouth at bed time, Disp: , Rfl:  .  dilTIAZem  CD (CARDIZEM  CD) 120 MG 24 hr capsule, Take 120 mg by mouth 1 (one) time each day in the evening, Disp: , Rfl:  .  DULoxetine (CYMBALTA) 30 MG DR capsule, Take 30 mg by mouth 1 (one) time each day, Disp: , Rfl:  .  ergocalciferol  1.25 MG (50000 UT) capsule, per week, Disp: , Rfl:  .  estradiol  (ESTRACE ) 0.5 MG tablet, Take 1 tablet by mouth 1 (one) time each day, Disp: , Rfl:  .  furosemide  (LASIX ) 20 MG tablet, TAKE 1/2 TABLET (10 MG TOTAL) BY MOUTH 1 (ONE) TIME EACH DAY AS NEEDED FOR LEG EDEMA, Disp: 45 tablet, Rfl: 1 .  gabapentin  (NEURONTIN ) 300 MG capsule, Take 3 capsules by mouth at bed time, Disp: , Rfl:  .  isosorbide  mononitrate (IMDUR ) 30 MG 24 hr tablet, Take 30 mg by mouth in the morning and 30 mg in the evening., Disp: , Rfl:  .  Melatonin 10 MG tablet, Take 10 mg by mouth at night if needed, Disp: , Rfl:  .  ondansetron  (ZOFRAN ) 4 MG tablet, Take 1 tablet by mouth every 8 (eight) hours if needed for nausea or vomiting, Disp: , Rfl:  .  pantoprazole  (PROTONIX ) 40 MG EC tablet, Take 1 tablet by mouth every morning As needed, Disp: , Rfl:  .  ranolazine  (RANEXA ) 500 MG 12 hr tablet, Take 500 mg by mouth in the morning and 500 mg in the evening., Disp: , Rfl:  .  rosuvastatin  (CRESTOR ) 40 MG tablet, Rosuvastatin  Calcium  40 MG Oral Tablet QTY: 90 tablet Days: 90 Refills: 2  Written: 01/18/19 Patient Instructions: Take 1 tablet by mouth ONCE daily., Disp: , Rfl:  .  sotalol  (BETAPACE ) 80 MG tablet, Take 80 mg by mouth in the morning and 80 mg in the evening., Disp: , Rfl:    Allergies Patient has no known allergies.   Physical Exam  Vitals BP 124/75 (BP Location: Left upper arm, Patient Position: Standing)   Pulse 65   Temp 98 F   Wt 160 lb (72.6 kg)   SpO2 95%   BMI 26.63 kg/m   Vitals reviewed. Constitutional: She appears well-developed. No  distress.  Cardiovascular:  Normal rate and regular rhythm.           Murmur heard.She exhibits no edema.  Pulmonary/Chest: Effort normal and breath sounds normal. No respiratory distress.  Abdominal: Soft. There is no abdominal tenderness. No hernia.  Skin: Skin is warm and dry.  Psychiatric: She has a normal mood and affect. Her behavior is normal.     Laboratory Studies  Chemistry  Lab Units 08/27/23 1111 04/29/23 1438 04/03/23 0949 06/24/22 1028 02/24/22 1215 01/30/22 1132  SODIUM mmol/L 134* 139 139 138 137 134*  POTASSIUM mmol/L 4.5 4.7 4.3 5.2 3.8 5.3  CHLORIDE mmol/L 101 102 105 104 105 98  CO2 mmol/L 24 29 26 27  24.0 28  ANION GAP mmol/L  --   --  8  --  8  --   MAGNESIUM  mg/dL  --   --   --   --  2.0  --   CALCIUM  mg/dL 9.3 9.6 9.8 9.3 9.1 9.7  PHOSPHORUS mg/dL 4.0 4.4*  --  4.0  --  3.7  URIC ACID mg/dL  --   --   --   --   --  4.7  GLUCOSE mg/dL 96 897* 79 90 96 87  ALBUMIN g/dL 4.4 4.2  --  4.1  --  4.2  BUN mg/dL 18 20 24* 13 12 18   CREATININE mg/dL 9.01 8.78* 8.93* 9.07 9.22 0.99        No lab exists for component: IRON SATURATION, TRANSSATPER  CBC  Lab Units 01/30/22 1132  HEMOGLOBIN URINE  NEGATIVE    Urine  Lab Units 01/30/22 1132  COLOR U  YELLOW  KETONES U MG/DL  NEGATIVE  PROT/CREAT RATIO UR mg/g creat NOTE  NOTE    Imaging and Other Studies  Serum protein electrophoresis negative for M spike December 2019 Cortisol 21.27 April 2018 TSH normal at 0.6 26 January 2018 CT chest with contrast- Severe right sternoclavicular joint space narrowing with bone-on-bone appearance, advanced osteoarthritis, severe right capsular thickening   EXAM: MRA ABDOMEN PELVIS W WO CONTRAST  DATE: 03/18/2022 2:03 PM  ACCESSION: [id number]  DICTATED: 03/19/2022 8:53 AM  INTERPRETATION LOCATION: [facility]   CLINICAL INDICATION: 68 years old Female with History of SCAD  - I25.42 - Spontaneous dissection of coronary artery    COMPARISON:  None   TECHNIQUE: MRA of the abdomen and pelvis was obtained with and without IV contrast. Multisequence, multiplanar images were obtained.    CONTRAST: 10 mL of  Multihance    ADRENAL GLANDS: Unremarkable.  KIDNEYS/URETERS: Symmetric enhancement of the bilateral kidneys. Extrarenal pelvises bilaterally. No hydronephrosis. No solid renal masses. Multiple bilateral renal cysts, the largest measuring 1.2 x 0.9 cm in the left interpolar kidney (4:14).  BLADDER: Unremarkable.     Orders Placed This Encounter  .  Renal Function Panel  . CBC and Differential  . Renal Function Panel  . Urinalysis with microscopic  . Urine Albumin / Creatinine Ratio  . Magnesium   . Uric Acid     10/23/2023-hemoglobin 12.6, creatinine 1.0, GFR 63, potassium 5.8, calcium  9.9, total cholesterol 261, triglyceride 127, HDL 55, LDL 183, albumin 4.5, AST, ALT normal, hemoglobin A1c 5.6%   Impression/Recommendations   Patient is a 68 y.o. 1-White female hypertension, hyponatremia  1. Hyperkalemia   2. Hypertension   3. Hyponatremia   4. Multiple renal cysts      1.  Hyperkalemia  Potassium mildly elevated at 5.8 on 10/23/2023. We discussed low potassium diet previously including avoiding fruit juices, potato products - fries, chips, mashed potatoes. We will draw labs today with no tourniquet    2.  Hyponatremia Differential diagnosis includes SIADH versus due to thiazide diuretics.  Chlorthalidone has been discontinued now  Patient is tolerating furosemide  okay-which she takes sparingly.  Volume is controlled.   Most recent sodium is normal-136 from 10/23/2023.SABRA Avoid chlorthalidone and follow thirst.  3. Essential hypertension Blood pressure and volume status are controlled No changes made Encouraged to follow an mild to moderate exercise program.  4.  Multiple renal cysts. Noted on most recent MRI in 2023 October.  Not noted in October 2018.  Kidneys were not enlarged.  Largest cyst was 1.2 x 2.9  cm. Repeat renal ultrasound-from 12/31/22-1.5 x 1.7 cm probably septated cyst in left kidney, suboptimally visualized.   Return in about 4 months (around 03/20/2024).   Saralee Stank, MD [facility] [address] [place] [address] Ph: [phone] Fax: [phone]

## 2023-12-17 NOTE — Progress Notes (Signed)
 Patient Name: Jenny Lee, female   Patient DOB: [birth year] Date of Service: 12/17/2023  Patient MRN: [id number] Provider Creating Note: Saralee Stank, MD  [phone] Primary Care Physician: Weyman Bright, MD PhD  [address] [place] [address] Additional Physicians/ Providers:     History of Present Illness Jenny Lee is a 68 y.o. female  Patient has following medical problems: history of anxiety GERD Hypertension Stress incontinence, history of vaginal prolapse. 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  She is worried about a UTI.  She does not have fever or chills.  No pain with urination. We obtained a urinalysis in our office today which shows glucose negative, bilirubin negative, ketone negative, specific gravity 1.020, blood negative, pH 5.5, protein negative, nitrate negative, leukocyte trace.  Urine microalbumin to creatinine ratio 30-300 mg/g.  The following portions of the patient's chart were reviewed in this encounter and updated as appropriate:  Tobacco  Allergies  Meds  Problems  Med Hx  Surg Hx  Fam Hx        Medications   Current Outpatient Medications:  .  acetaminophen  (TYLENOL ) 500 MG tablet, Take 500 mg by mouth every 6 (six) hours if needed for mild pain, Disp: , Rfl:  .  Aspirin  Buf,CaCarb-MgCarb-MgO, 81 MG tablet, Take 81 mg by mouth 1 (one) time each day, Disp: , Rfl:  .  celecoxib (CeleBREX) 100 MG capsule, Take 100 mg by mouth in the morning and 100 mg in the  evening., Disp: , Rfl:  .  clonazePAM  (KlonoPIN ) 0.5 MG tablet, Take 1 tablet by mouth at bed time, Disp: , Rfl:  .  dilTIAZem  CD (CARDIZEM  CD) 120 MG 24 hr capsule, Take 120 mg by mouth 1 (one) time each day in the evening, Disp: , Rfl:  .  DULoxetine (CYMBALTA) 30 MG DR capsule, Take 30 mg by mouth 1 (one) time each day, Disp: , Rfl:  .  ergocalciferol  1.25 MG (50000 UT) capsule, per week, Disp: , Rfl:  .  estradiol  (ESTRACE ) 0.5 MG tablet, Take 1 tablet by mouth 1 (one) time each day, Disp: , Rfl:  .  furosemide  (LASIX ) 20 MG tablet, TAKE 1/2 TABLET (10 MG TOTAL) BY MOUTH 1 (ONE) TIME EACH DAY AS NEEDED FOR LEG EDEMA, Disp: 45 tablet, Rfl: 1 .  gabapentin  (NEURONTIN ) 300 MG capsule, Take 3 capsules by mouth at bed time, Disp: , Rfl:  .  isosorbide  mononitrate (IMDUR ) 30 MG 24 hr tablet, Take 30 mg by mouth in the morning and 30 mg in the evening., Disp: , Rfl:  .  Melatonin 10 MG tablet, Take 10 mg by mouth at night if needed, Disp: , Rfl:  .  ondansetron  (ZOFRAN ) 4 MG tablet, Take 1 tablet by mouth every 8 (eight) hours if needed for nausea or vomiting, Disp: , Rfl:  .  pantoprazole  (PROTONIX ) 40 MG EC tablet, Take 1 tablet by mouth every morning As needed, Disp: , Rfl:  .  ranolazine  (RANEXA ) 500 MG 12 hr tablet, Take 500 mg by mouth in the morning and 500 mg in the evening., Disp: , Rfl:  .  rosuvastatin  (CRESTOR ) 40 MG tablet, Rosuvastatin  Calcium  40 MG Oral Tablet  QTY: 90 tablet Days: 90 Refills: 2  Written: 01/18/19 Patient Instructions: Take 1 tablet by mouth ONCE daily., Disp: , Rfl:  .  sotalol  (BETAPACE ) 80 MG tablet, Take 80 mg by mouth in the morning and 80 mg in the evening., Disp: , Rfl:  .  tiZANidine  (ZANAFLEX ) 2 MG tablet, Take 2 mg by mouth if needed, Disp: , Rfl:    Allergies Patient has no known allergies.   Physical Exam  Vitals BP 120/74 (BP Location: Right upper arm, Patient Position: Standing)   Pulse 69   Temp 98.2 F   Wt 161 lb (73 kg)   SpO2 98%   BMI 26.79  kg/m   Vitals reviewed. Constitutional: She appears well-developed. No distress.  Cardiovascular:  Normal rate and regular rhythm.           Murmur heard.She exhibits no edema.  Pulmonary/Chest: Effort normal and breath sounds normal. No respiratory distress.  Abdominal: Soft. There is no abdominal tenderness. No hernia.  Skin: Skin is warm and dry.  Psychiatric: She has a normal mood and affect. Her behavior is normal.     Laboratory Studies  Chemistry  Lab Units 12/17/23 1028 11/19/23 1503 08/27/23 1111 04/29/23 1438 04/03/23 0949 06/24/22 1028 02/24/22 1215 01/30/22 1132  SODIUM mmol/L  --  134* 134* 139 139 138 137 134*  POTASSIUM mmol/L  --  4.4 4.5 4.7 4.3 5.2 3.8 5.3  CHLORIDE mmol/L  --  101 101 102 105 104 105 98  CO2 mmol/L  --  26 24 29 26 27  24.0 28  ANION GAP mmol/L  --   --   --   --  8  --  8  --   MAGNESIUM  mg/dL  --   --   --   --   --   --  2.0  --   CALCIUM  mg/dL  --  8.9 9.3 9.6 9.8 9.3 9.1 9.7  PHOSPHORUS mg/dL  --  2.9 4.0 4.4*  --  4.0  --  3.7  URIC ACID mg/dL  --   --   --   --   --   --   --  4.7  GLUCOSE mg/dL  --  897* 96 897* 79 90 96 87  ALBUMIN  30 mg/L 4.1 4.4 4.2  --  4.1  --  4.2  BUN mg/dL  --  16 18 20  24* 13 12 18   CREATININE mg/dL  --  8.94 9.01 8.78* 8.93* 0.92 0.77 0.99        No lab exists for component: IRON SATURATION, TRANSSATPER  CBC  Lab Units 01/30/22 1132  HEMOGLOBIN URINE  NEGATIVE    Urine  Lab Units 12/17/23 1028 01/30/22 1132  COLOR U   --  YELLOW  COLOR UA  Yellow  --   CLARITY UA  Slightly Cloudy  --   KETONES U MG/DL   --  NEGATIVE  KETONES UA  Negative  --   PH UA  5.5  --   UROBILINOGEN UA  0.2  --   PROT/CREAT RATIO UR mg/g creat  --  NOTE  NOTE    Imaging and Other Studies  Serum protein electrophoresis negative for M spike December 2019 Cortisol 21.27 April 2018 TSH normal at 0.6 26 January 2018 CT chest with contrast- Severe right sternoclavicular joint space narrowing with  bone-on-bone appearance, advanced osteoarthritis, severe right capsular thickening   EXAM: MRA ABDOMEN PELVIS W WO CONTRAST  DATE: 03/18/2022 2:03 PM  ACCESSION: [id number]  DICTATED: 03/19/2022 8:53 AM  INTERPRETATION LOCATION: [facility]   CLINICAL INDICATION: 68 years old Female with History of SCAD  - I25.42 - Spontaneous dissection of coronary artery    COMPARISON: None   TECHNIQUE: MRA of the abdomen and pelvis was obtained with and without IV contrast. Multisequence, multiplanar images were obtained.    CONTRAST: 10 mL of  Multihance    ADRENAL GLANDS: Unremarkable.  KIDNEYS/URETERS: Symmetric enhancement of the bilateral kidneys. Extrarenal pelvises bilaterally. No hydronephrosis. No solid renal masses. Multiple bilateral renal cysts, the largest measuring 1.2 x 0.9 cm in the left interpolar kidney (4:14).  BLADDER: Unremarkable.     Orders Placed This Encounter  . POCT urinalysis auto, w/o scope     10/23/2023-hemoglobin 12.6, creatinine 1.0, GFR 63, potassium 5.8, calcium  9.9, total cholesterol 261, triglyceride 127, HDL 55, LDL 183, albumin 4.5, AST, ALT normal, hemoglobin A1c 5.6%   Impression/Recommendations   Patient is a 68 y.o. 1-White female hypertension, hyponatremia  1. Dysuria   2. Hyperkalemia   3. Hypertension   4. Hyponatremia   5. Multiple renal cysts   6. Abnormal urine      1.  Dysuria - Urinalysis in office today is not consistent with UTI.  Not sure if patient is experiencing bladder spasms for different reason. - Recommended to try over-the-counter Azo and cranberry tablets.  2.  Hyperkalemia  Potassium level checked without a tourniquet was 4.4 on 11/19/2023.  3.  Hyponatremia Differential diagnosis includes SIADH versus due to thiazide diuretics.  Chlorthalidone has been discontinued now  Patient is tolerating furosemide  okay-which she takes sparingly.  Volume is controlled.   Sodium usually ranges between 134-139. Avoid  chlorthalidone and follow thirst.  4. Essential hypertension Blood pressure and volume status are controlled No changes made Encouraged to follow an mild to moderate exercise program.  5.  Multiple renal cysts. Noted on most recent MRI in 2023 October.  Not noted in October 2018.  Kidneys were not enlarged.  Largest cyst was 1.2 x 2.9 cm. Repeat renal ultrasound-from 12/31/22-1.5 x 1.7 cm probably septated cyst in left kidney, suboptimally visualized.   Return in about 4 months (around 04/18/2024).   Saralee Stank, MD [facility] [address], [place] [address] Ph: [phone] Fax: [phone]

## 2024-01-04 ENCOUNTER — Other Ambulatory Visit: Payer: Self-pay | Admitting: Cardiovascular Disease

## 2024-01-14 ENCOUNTER — Emergency Department

## 2024-01-14 ENCOUNTER — Emergency Department
Admission: EM | Admit: 2024-01-14 | Discharge: 2024-01-14 | Disposition: A | Discharge status: Home / Self Care | Source: Ambulatory Visit

## 2024-01-14 ENCOUNTER — Telehealth: Payer: Self-pay | Admitting: Cardiovascular Disease

## 2024-01-14 ENCOUNTER — Encounter: Payer: Self-pay | Admitting: Emergency Medicine

## 2024-01-14 DIAGNOSIS — R002 Palpitations: Secondary | ICD-10-CM | POA: Diagnosis present

## 2024-01-14 DIAGNOSIS — I10 Essential (primary) hypertension: Secondary | ICD-10-CM | POA: Diagnosis not present

## 2024-01-14 DIAGNOSIS — R9431 Abnormal electrocardiogram [ECG] [EKG]: Secondary | ICD-10-CM | POA: Diagnosis not present

## 2024-01-14 DIAGNOSIS — I2510 Atherosclerotic heart disease of native coronary artery without angina pectoris: Secondary | ICD-10-CM | POA: Insufficient documentation

## 2024-01-14 LAB — CBC
HCT: 38.6 % (ref 36.0–46.0)
Hemoglobin: 12.6 g/dL (ref 12.0–15.0)
MCH: 28.1 pg (ref 26.0–34.0)
MCHC: 32.6 g/dL (ref 30.0–36.0)
MCV: 86 fL (ref 80.0–100.0)
Platelets: 287 K/uL (ref 150–400)
RBC: 4.49 MIL/uL (ref 3.87–5.11)
RDW: 14.1 % (ref 11.5–15.5)
WBC: 7.6 K/uL (ref 4.0–10.5)
nRBC: 0 % (ref 0.0–0.2)

## 2024-01-14 LAB — BASIC METABOLIC PANEL WITH GFR
Anion gap: 11 (ref 5–15)
BUN: 15 mg/dL (ref 8–23)
CO2: 23 mmol/L (ref 22–32)
Calcium: 9.4 mg/dL (ref 8.9–10.3)
Chloride: 101 mmol/L (ref 98–111)
Creatinine, Ser: 1.01 mg/dL — ABNORMAL HIGH (ref 0.44–1.00)
GFR, Estimated: >60 mL/min (ref >60)
Glucose, Bld: 104 mg/dL — ABNORMAL HIGH (ref 70–99)
Potassium: 4.3 mmol/L (ref 3.5–5.1)
Sodium: 135 mmol/L (ref 135–145)

## 2024-01-14 LAB — TROPONIN I (HIGH SENSITIVITY)
Troponin I (High Sensitivity): 4 ng/L (ref <18)
Troponin I (High Sensitivity): 4 ng/L (ref <18)

## 2024-01-14 MED ORDER — IOHEXOL 350 MG/ML SOLN
75.0000 mL | Freq: Once | INTRAVENOUS | Status: AC | PRN
  Administered 2024-01-14: 75 mL via INTRAVENOUS

## 2024-01-14 NOTE — ED Provider Notes (Signed)
-----------------------------------------   5:06 PM on 01/14/2024 -----------------------------------------  I took over care of this patient from Dr. Nicholaus.  The CT angio was negative for acute findings.  IMPRESSION:  No acute intrathoracic abnormality; specifically, no pulmonary  embolism, pneumonia, or pleural effusion.    Aortic Atherosclerosis (ICD10-I70.0).    On reassessment, the patient remains asymptomatic.  She is stable for discharge home.  Return precautions were given, and she expressed understanding.   Jacolyn Pae, MD 01/14/24 [phone]

## 2024-01-14 NOTE — ED Triage Notes (Signed)
 Pt presents to the ED via POV with complaints of palpitations that occurred last night. She notes taking her Cardizem  and things improved but when she contacted her PCP today they advised her to come to the ED. A&Ox4 at this time. Denies CP or SOB.

## 2024-01-14 NOTE — ED Provider Notes (Signed)
 [facility] Provider Note    Event Date/Time   First MD Initiated Contact with Patient 01/14/24 1456     (approximate)   History   Palpitations   HPI  Jenny Lee is a 68 y.o. female with a past medical history paroxysmal A-fib, CAD, tacky Apsu, hypertension, anxiety who presents with an episode of chest pressure and palpitations yesterday evening now resolved.  Patient reports that she has been under a lot of stress for the past 24 hours.  She took her nighttime medications as prescribed and subsequently developed palpitations and chest pressure.  This resolved with a dose of her home clonazepam .  She currently denies any shortness of breath chest pain abdominal pain.  She called her primary care physician's office today and was advised to come to the emergency department.  She is otherwise in her usual state of health and her husband at bedside contributes to the history      Physical Exam   Triage Vital Signs: ED Triage Vitals  Encounter Vitals Group     BP 01/14/24 1334 (!) 154/77     Girls Systolic BP Percentile --      Girls Diastolic BP Percentile --      Boys Systolic BP Percentile --      Boys Diastolic BP Percentile --      Pulse Rate 01/14/24 1334 84     Resp 01/14/24 1334 18     Temp 01/14/24 1334 97.9 F (36.6 C)     Temp Source 01/14/24 1334 Oral     SpO2 01/14/24 1334 100 %     Weight 01/14/24 1330 163 lb (73.9 kg)     Height 01/14/24 1330 5' 5 (1.651 m)     Head Circumference --      Peak Flow --      Pain Score 01/14/24 1330 0     Pain Loc --      Pain Education --      Exclude from Growth Chart --     Most recent vital signs: Vitals:   01/14/24 1334  BP: (!) 154/77  Pulse: 84  Resp: 18  Temp: 97.9 F (36.6 C)  SpO2: 100%    Nursing Triage Note reviewed. Vital signs reviewed and patients oxygen saturation is normoxic  General: Patient is well nourished, well developed, awake and alert, resting comfortably in  no acute distress Head: Normocephalic and atraumatic Eyes: Normal inspection, extraocular muscles intact, no conjunctival pallor Ear, nose, throat: Normal external exam Neck: Normal range of motion Respiratory: Patient is in no respiratory distress, lungs CTAB Cardiovascular: Patient is not tachycardic, RRR without murmur appreciated GI: Abd SNT with no guarding or rebound  Back: Normal inspection of the back with good strength and range of motion throughout all ext Extremities: pulses intact with good cap refills, no LE pitting edema or calf tenderness Neuro: The patient is alert and oriented to person, place, and time, appropriately conversive, with 5/5 bilat UE/LE strength, no gross motor or sensory defects noted. Coordination appears to be adequate. Skin: Warm, dry, and intact Psych: normal mood and affect, no SI or HI  ED Results / Procedures / Treatments   Labs (all labs ordered are listed, but only abnormal results are displayed) Labs Reviewed  BASIC METABOLIC PANEL WITH GFR - Abnormal; Notable for the following components:      Result Value   Glucose, Bld 104 (*)    Creatinine, Ser 1.01 (*)    All  other components within normal limits  CBC  TROPONIN I (HIGH SENSITIVITY)  TROPONIN I (HIGH SENSITIVITY)     EKG EKG and rhythm strip are interpreted by myself:   EKG: [Normal sinus rhythm] at heart rate of 81, normal QRS duration, QTc 418, nonspecific ST segments and T waves no ectopy EKG not consistent with Acute STEMI Rhythm strip: NSR in lead II   RADIOLOGY CXR: No acute abnormality on my independent review and interpretation and radiologist agrees  CTA PE: Pending    PROCEDURES:  Critical Care performed: No  Procedures   MEDICATIONS ORDERED IN ED: Medications - No data to display   IMPRESSION / MDM / ASSESSMENT AND PLAN / ED COURSE                                Differential diagnosis includes, but is not limited to arrhythmia, ACS, PE, electrolyte  derangements, anemia  ED Course: Patient is well-appearing and is currently asymptomatic.  EKG demonstrates no acute ischemia but does have new T wave inversions that 1 on her prior EKGs.  Given that she is on estradiol  I am concerned mildly about right heart strain and will obtain a CT angio chest to ensure no pulmonary embolism.  Otherwise her troponin is not elevated despite symptom onset being more than 12 hours previously and she has no electrolyte derangements or anemia.  Although I cannot rule out intermittent episodes of paroxysmal A-fib she has good cardiology follow-up.  Case signed out to oncoming physician at 3:10 PM pending CTA PE   Clinical Course as of 01/14/24 1514  Thu Jan 14, 2024  1502 DG Chest 2 View No acute abnormality [HD]    Clinical Course User Index [HD] Nicholaus Rolland BRAVO, MD   -- Risk: 5 This patient has a high risk of morbidity due to further diagnostic testing or treatment. Rationale: This patient's evaluation and management involve a high risk of morbidity due to the potential severity of presenting symptoms, need for diagnostic testing, and/or initiation of treatment that may require close monitoring. The differential includes conditions with potential for significant deterioration or requiring escalation of care. Treatment decisions in the ED, including medication administration, procedural interventions, or disposition planning, reflect this level of risk. Additional Support: -- Drug therapy requiring intensive monitoring for toxicity [ ]  -- Decision regarding elective major surgery with idenitified patient or procedure risk factors [ ]  -- Decision regarding hospitalization or escalation of hospital-level care [ ]  -- Decision not to resuscitate or to de-escalate care because of poor prognosis [ ]  -- Parental controlled substances [ ]   COPA: 5 The patient has a severe exacerbation, progression, or side effect of treatment of the following illness/illnesses:  []  OR  The patient has the following acute or chronic illness/injury that poses a possible threat to life or bodily function: [X] : The patient has a potentially serious acute condition or an acute exacerbation of a chronic illness requiring urgent evaluation and management in the Emergency Department. The clinical presentation necessitates immediate consideration of life-threatening or function-threatening diagnoses, even if they are ultimately ruled out.  Data(2/3 categories following were performed): 5 I reviewed or ordered at least three unique tests, external notes, and/or the history required an independent historian as one of the three requirements as following: CBC, BMP, troponin AND  I independently interpreted the following test: Xray chest OR  I discussed the management of the patient with the following external  physician or qualified healthcare provider: []    Suggested E/M Coding Level: 5, 99285, This has been selected based on the 02/11/2022 CPT guidelines for E/M codes in the Emergency Department based on 2/3 of the CoPA, Data, and Risk.   FINAL CLINICAL IMPRESSION(S) / ED DIAGNOSES   Final diagnoses:  Palpitations  T wave inversion in EKG     Rx / DC Orders   ED Discharge Orders     None        Note:  This document was prepared using Dragon voice recognition software and may include unintentional dictation errors.   Nicholaus Rolland BRAVO, MD 01/14/24 [phone]

## 2024-01-14 NOTE — Telephone Encounter (Signed)
 Patient reports having chest pressure, lightheadedness, sweaty hands, and left arm feeling strange. She also states that she is afraid of standing up for fear of passing out. Advised that she should go to the Emergency room for further evaluation. She verbalized understanding.

## 2024-01-14 NOTE — Telephone Encounter (Signed)
  Pt c/o of Chest Pain: STAT if active CP, including tightness, pressure, jaw pain, radiating pain to shoulder/upper arm/back, CP unrelieved by Nitro. Symptoms reported of SOB, nausea, vomiting, sweating.  1. Are you having CP right now? No     2. Are you experiencing any other symptoms (ex. SOB, nausea, vomiting, sweating)? Hands sweat, pressure on her chest, SOB feel faint and light headedness    3. Is your CP continuous or coming and going? Coming and going    4. Have you taken Nitroglycerin ? No    5. How long have you been experiencing CP? Last night     6. If NO CP at time of call then end call with telling Pt to call back or call 911 if Chest pain returns prior to return call from triage team.

## 2024-01-14 NOTE — Discharge Instructions (Addendum)
 Make an appointment to follow-up with your cardiologist.  Return to the ER for new, worsening, or persistent severe palpitations, chest pain, or any other new or worsening symptoms that concern you.

## 2024-01-26 ENCOUNTER — Ambulatory Visit (INDEPENDENT_AMBULATORY_CARE_PROVIDER_SITE_OTHER): Admitting: Family Medicine

## 2024-01-26 ENCOUNTER — Encounter: Payer: Self-pay | Admitting: Family Medicine

## 2024-01-26 VITALS — BP 109/67 | HR 66 | Ht 65.0 in | Wt 156.0 lb

## 2024-01-26 DIAGNOSIS — G8929 Other chronic pain: Secondary | ICD-10-CM

## 2024-01-26 DIAGNOSIS — I252 Old myocardial infarction: Secondary | ICD-10-CM | POA: Diagnosis not present

## 2024-01-26 DIAGNOSIS — I5181 Takotsubo syndrome: Secondary | ICD-10-CM

## 2024-01-26 DIAGNOSIS — L304 Erythema intertrigo: Secondary | ICD-10-CM

## 2024-01-26 DIAGNOSIS — F419 Anxiety disorder, unspecified: Secondary | ICD-10-CM | POA: Diagnosis not present

## 2024-01-26 DIAGNOSIS — I10 Essential (primary) hypertension: Secondary | ICD-10-CM

## 2024-01-26 DIAGNOSIS — I479 Paroxysmal tachycardia, unspecified: Secondary | ICD-10-CM

## 2024-01-26 DIAGNOSIS — Z23 Encounter for immunization: Secondary | ICD-10-CM

## 2024-01-26 DIAGNOSIS — G4733 Obstructive sleep apnea (adult) (pediatric): Secondary | ICD-10-CM | POA: Insufficient documentation

## 2024-01-26 DIAGNOSIS — N993 Prolapse of vaginal vault after hysterectomy: Secondary | ICD-10-CM

## 2024-01-26 DIAGNOSIS — N393 Stress incontinence (female) (male): Secondary | ICD-10-CM

## 2024-01-26 DIAGNOSIS — E2839 Other primary ovarian failure: Secondary | ICD-10-CM | POA: Insufficient documentation

## 2024-01-26 DIAGNOSIS — E559 Vitamin D deficiency, unspecified: Secondary | ICD-10-CM | POA: Insufficient documentation

## 2024-01-26 DIAGNOSIS — E785 Hyperlipidemia, unspecified: Secondary | ICD-10-CM

## 2024-01-26 DIAGNOSIS — K219 Gastro-esophageal reflux disease without esophagitis: Secondary | ICD-10-CM

## 2024-01-26 DIAGNOSIS — M21612 Bunion of left foot: Secondary | ICD-10-CM | POA: Insufficient documentation

## 2024-01-26 DIAGNOSIS — Z1231 Encounter for screening mammogram for malignant neoplasm of breast: Secondary | ICD-10-CM

## 2024-01-26 DIAGNOSIS — I25110 Atherosclerotic heart disease of native coronary artery with unstable angina pectoris: Secondary | ICD-10-CM

## 2024-01-26 MED ORDER — NYSTATIN-TRIAMCINOLONE 100000-0.1 UNIT/GM-% EX OINT: 1.0000 | TOPICAL_OINTMENT | Freq: Two times a day (BID) | CUTANEOUS | 1 refills | Status: AC

## 2024-01-26 NOTE — Patient Instructions (Signed)
 [facility] [address] [place],  [place]  [address] Main: [phone]

## 2024-01-26 NOTE — Progress Notes (Unsigned)
 New patient visit   Patient: Jenny Lee   DOB: March 23, 1956   68 y.o. Female  MRN: [id number] Visit Date: 01/26/2024  Today's healthcare provider: Rockie Agent, MD   Chief Complaint  Patient presents with   New Patient (Initial Visit)    New patient here to establish care. Previous PCP: Jadali, Fayegh Mammogram: 2023 Bone Density: 05/15/2022 Normal  Pap Smear: several years Colonoscopy: 07/01/23 Dr. Ponce 5-7 years Flu Vaccine: Today   Subjective    Jenny Lee is a 68 y.o. female who presents today as a new patient to establish care.   HPI     New Patient (Initial Visit)    Additional comments: New patient here to establish care. Previous PCP: Jadali, Fayegh Mammogram: 2023 Bone Density: 05/15/2022 Normal  Pap Smear: several years Colonoscopy: 07/01/23 Dr. Ponce 5-7 years Flu Vaccine: Today      Last edited by Rosas, Joseline E, CMA on 01/26/2024  9:11 AM.       Discussed the use of AI scribe software for clinical note transcription with the patient, who gave verbal consent to proceed.  History of Present Illness Jenny Lee is a 69 year old female who presents for a new patient visit and routine follow-up.  She has a history of stress incontinence and vaginal prolapse following a hysterectomy. She is unsure when her last Pap smear was and desires a referral to gynecology. She also wants to receive her influenza vaccine today.  She has a history of episodic tachycardia and was recently in the emergency department due to her heart 'going kind of crazy' while she was sitting and watching TV. The symptoms have stopped but occasionally feel like they might return. Her current medications for heart-related issues include diltiazem  120 mg daily, Imdur  30 mg twice a day, Ranexa  1000 mg twice daily, and sotalol  80 mg daily. She also takes aspirin  81 mg daily.  She uses a CPAP machine for sleep apnea, which she has been using for over  four years. For anxiety, she takes clonazepam  0.5 mg once daily, mainly at night to help with sleep. She also takes duloxetine 30 mg daily, though not consistently, for anxiety and back pain.  She experiences back pain, primarily in the lower back, and is managed by a pain specialist. Her medications for pain include gabapentin  900 mg at night and Zanaflex  2 mg every eight hours as needed. She also takes high-dose vitamin D  once a week.  She has a history of coronary artery disease and Takotsubo cardiomyopathy. She carries a device for monitoring her heart and has been using it for four years. She also takes Crestor  40 mg daily for cholesterol management.  She reports a bunion on her foot that is 'really bad' and causes significant discomfort. She has not had any surgeries on her feet.  She has a rash under her breast that itches and comes and goes. She previously used a cream prescribed years ago, which helped temporarily.  Her medications also include Protonix  40 mg for GERD, Zyrtec 10 mg for allergies, and Lasix  10 mg as needed for swelling, particularly in her ankles.      Past Medical History:  Diagnosis Date   Anxiety    a.) on BZO (clonazepam ) PRN   Atypical angina (HCC)    CAD (coronary artery disease) 11/2000   a.) s/p NSTEMI 11/2000 --> LHC 12/22/2000 --> 100% mPDA -->  no PCI placed; SCAD of dLAD; b.) LHC 04/26/2003: normal coronaries;  c.) LHC 11/26/2015: EF 35%, anteroapical aneurysm, normal coronary arteries; d.) cCTA 11/12/2022: Ca2+ score 0; e. 08/2023 reportedly nl stress test.   Chronic pain syndrome    Diastolic dysfunction    a.) TTE 09/12/2022: EF 60-65%, AoV sclerosis, mild MR/AR, G1DD; b. 09/2023 Echo: EF of 60 to 65%, grade 1 diastolic dysfunction, apical dyskinesis, normal RV function, mild AI and aortic sclerosis with raphe between the left and right cusp of the aortic valve making it functionally bicuspid/fusion.   Diverticulosis    Dyspnea    Female genuine stress  incontinence 08/15/2014   GERD (gastroesophageal reflux disease)    H. pylori infection    History of 2019 novel coronavirus disease (COVID-19) 05/21/2020   HLD (hyperlipidemia)    HTN (hypertension)    Insomnia    a.) takes melatonin   Kidney cysts    Mastalgia 10/06/2012   NSTEMI (non-ST elevated myocardial infarction) (HCC) 11/2000   a.) LHC 12/22/2000 --> 100% mPDA --> no PCI (stents) placed.  Procedure complicated by (+) SCAD of dLAD with resulting ST elevation --> IABP placed and patient transferred to [facility]   Obstructive sleep apnea on CPAP    PAF (paroxysmal atrial fibrillation) (HCC)    a. 03/2023 noted on EP study ([facility])-->s/p ILR 06/2023.   Paroxysmal SVT (supraventricular tachycardia) (HCC)    a. on sotalol  - followed by [facility] EP; b. 03/2023 - not induced on EP study.   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 02/2017 s/p PTA of RRA.   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   Takotsubo cardiomyopathy    a.) LHC 11/26/2015: EF 35%; b.) TTE 09/12/2022: EF 60-65%; c. EF of 60 to 65%, grade 1 diastolic dysfunction, apical dyskinesis, normal RV function.   Vitamin D  deficiency     Outpatient Medications Prior to Visit  Medication Sig   acetaminophen  (TYLENOL ) 500 MG tablet Take 2 tablets (1,000 mg total) by mouth every 6 (six) hours as needed for mild pain.   aspirin  81 MG tablet Take 81 mg by mouth daily.   celecoxib (CELEBREX) 100 MG capsule Take 100 mg by mouth 2 (two) times daily.   clonazePAM  (KLONOPIN ) 0.5 MG tablet Take 1 tablet (0.5 mg total) by mouth 2 (two) times daily for 5 days. (Patient taking differently: Take 0.5 mg by mouth daily.)   diltiazem  (CARDIZEM  CD) 120 MG 24 hr capsule Take 120 mg by mouth daily.    estradiol  (ESTRACE ) 0.5 MG tablet TAKE 1 TABLET BY MOUTH EVERY DAY   furosemide  (LASIX ) 20 MG tablet Take 10 mg by mouth daily as needed.   gabapentin  (NEURONTIN ) 300 MG capsule Take by mouth. Take 1 tablet am and 3 tablets pm daily.   isosorbide  mononitrate (IMDUR ) 30 MG 24 hr tablet Take 1 tablet (30 mg total) by mouth 2 (two) times daily.   Melatonin 10 MG TABS Take 10 mg by mouth at bedtime.    ondansetron  (ZOFRAN ) 4 MG tablet Take 1 tablet (4 mg total) by mouth every 8 (eight) hours as needed for nausea.   pantoprazole  (PROTONIX ) 40 MG tablet 40 mg as needed.   ranolazine  (RANEXA ) 1000 MG SR tablet Take 1 tablet (1,000 mg total) by mouth 2 (two) times daily.  rosuvastatin  (CRESTOR ) 40 MG tablet TAKE 1 TABLET BY MOUTH EVERY DAY   sotalol  (BETAPACE ) 80 MG tablet Take 1 tablet by mouth daily.   tiZANidine  (ZANAFLEX ) 2 MG tablet Take 2 mg by mouth every 8 (eight) hours as needed.   Vitamin D , Ergocalciferol , (DRISDOL ) 1.25 MG (50000 UNIT) CAPS capsule TAKE ONE CAPSULE BY MOUTH EVERY WEEK   cetirizine (ZYRTEC) 10 MG tablet Take 10 mg by mouth daily.   DULoxetine (CYMBALTA) 30 MG capsule Take 30 mg by mouth daily.   No facility-administered medications prior to visit.    Past Surgical History:  Procedure Laterality Date   ABDOMINAL HYSTERECTOMY  05/26/2002   CARDIAC CATHETERIZATION N/A 11/26/2015   Procedure: Left Heart Cath and Coronary Angiography;  Surgeon: Denyse DELENA Bathe, MD;  Location: [facility] INVASIVE CV LAB;  Service: Cardiovascular;  Laterality: N/A;   CERVICAL SPINE SURGERY  05/26/1996   fusion   CLAVICLE SURGERY Right    shaved bone   COLONOSCOPY WITH PROPOFOL  N/A 12/23/2016   Procedure: COLONOSCOPY WITH PROPOFOL ;  Surgeon: Therisa Bi, MD;  Location: [facility] ENDOSCOPY;  Service: Endoscopy;  Laterality: N/A;   COLONOSCOPY WITH PROPOFOL  N/A 06/26/2017   Procedure: COLONOSCOPY WITH PROPOFOL ;  Surgeon: Janalyn Keene NOVAK, MD;  Location: [facility] ENDOSCOPY;  Service: Gastroenterology;   Laterality: N/A;   COLONOSCOPY WITH PROPOFOL  N/A 07/01/2023   Procedure: COLONOSCOPY WITH PROPOFOL ;  Surgeon: Therisa Bi, MD;  Location: [facility] ENDOSCOPY;  Service: Gastroenterology;  Laterality: N/A;   CORONARY BALLOON ANGIOPLASTY  11/2000   ESOPHAGOGASTRODUODENOSCOPY (EGD) WITH PROPOFOL  N/A 12/23/2016   Procedure: ESOPHAGOGASTRODUODENOSCOPY (EGD) WITH PROPOFOL ;  Surgeon: Therisa Bi, MD;  Location: [facility] ENDOSCOPY;  Service: Endoscopy;  Laterality: N/A;   ESOPHAGOGASTRODUODENOSCOPY (EGD) WITH PROPOFOL  N/A 03/31/2017   Procedure: ESOPHAGOGASTRODUODENOSCOPY (EGD) WITH PROPOFOL  WITH GASTRIC MAPPING;  Surgeon: Therisa Bi, MD;  Location: [facility] ENDOSCOPY;  Service: Gastroenterology;  Laterality: N/A;   ESOPHAGOGASTRODUODENOSCOPY (EGD) WITH PROPOFOL  N/A 12/21/2017   Procedure: ESOPHAGOGASTRODUODENOSCOPY (EGD) WITH PROPOFOL ;  Surgeon: Therisa Bi, MD;  Location: [facility] ENDOSCOPY;  Service: Gastroenterology;  Laterality: N/A;   LEFT HEART CATH AND CORONARY ANGIOGRAPHY Left 04/26/2003   Procedure: LEFT HEART CATH AND CORONARY ANGIOGRAPHY; Location: [facility]; Surgeon: Margie Lovelace, MD   POLYPECTOMY  07/01/2023   Procedure: POLYPECTOMY;  Surgeon: Therisa Bi, MD;  Location: [facility] ENDOSCOPY;  Service: Gastroenterology;;   RENAL ANGIOGRAPHY Bilateral 03/02/2017   Procedure: RENAL ANGIOGRAPHY;  Surgeon: Marea Selinda RAMAN, MD;  Location: [facility] INVASIVE CV LAB;  Service: Cardiovascular;  Laterality: Bilateral;   VAGINAL PROLAPSE REPAIR     Family Status  Relation Name Status   Mother  Deceased   Father  Deceased   Sister  (Not Specified)  No partnership data on file   Family History  Problem Relation Age of Onset   Hypertension Mother    Breast cancer Sister 9   Social History   Socioeconomic History   Marital status: Married    Spouse name: Masoud   Number of children: 4   Years of education: Not on file   Highest education level: Not on file  Occupational History   Not on file  Tobacco Use   Smoking  status: Never   Smokeless tobacco: Never  Vaping Use   Vaping status: Never Used  Substance and Sexual Activity   Alcohol use: Not Currently    Comment: occasional wine   Drug use: No   Sexual activity: Yes  Other Topics Concern   Not on file  Social History Narrative   Lives at  home with husband.   Social Drivers of Health   Financial Resource Strain: Medium Risk (07/12/2021)   Received from [facility]   Overall Financial Resource Strain (CARDIA)    Difficulty of Paying Living Expenses: Somewhat hard  Food Insecurity: No Food Insecurity (09/12/2022)   Hunger Vital Sign    Worried About Running Out of Food in the Last Year: Never true    Ran Out of Food in the Last Year: Never true  Transportation Needs: No Transportation Needs (09/12/2022)   PRAPARE - Administrator, Civil Service (Medical): No    Lack of Transportation (Non-Medical): No  Physical Activity: Insufficiently Active (07/12/2021)   Received [facility]   Exercise Vital Sign    On average, how many days per week do you engage in moderate to strenuous exercise (like a brisk walk)?: 3 days    On average, how many minutes do you engage in exercise at this level?: 30 min  Stress: Stress Concern Present (07/12/2021)   Received from [facility] of Occup[facility] - Occupational Stress Questionnaire    Feeling of Stress : Very much  Social Connections: Moderately Isolated (07/12/2021)   Received from [facility]   Social Connection and Isolation Panel    In a typical week, how many times do you talk on the phone with family, friends, or neighbors?: More than three times a week    How often do you get together with friends or relatives?: More than three times a week    How often do you attend church or religious services?: Never    Do you belong to any clubs or organizations such as church groups, unions, fraternal or athletic groups, or school groups?: No    How often  do you attend meetings of the clubs or organizations you belong to?: Never    Are you married, widowed, divorced, separated, never married, or living with a partner?: Married     Allergies  Allergen Reactions   No Known Allergies Other (See Comments)    Immunization History  Administered Date(s) Administered   INFLUENZA, HIGH DOSE SEASONAL PF 01/26/2024   Pneumococcal Polysaccharide-23 11/27/2015   Td 01/29/1994    Health Maintenance  Topic Date Due   Medicare Annual Wellness (AWV)  Never done   COVID-19 Vaccine (1) Never done   Hepatitis C Screening  Never done   Zoster Vaccines- Shingrix (1 of 2) Never done   Cervical Cancer Screening (HPV/Pap Cotest)  Never done   DTaP/Tdap/Td (2 - Tdap) 01/30/2004   Pneumococcal Vaccine: 50+ Years (2 of 2 - PCV) 11/26/2016   MAMMOGRAM  07/18/2021   DEXA SCAN  Never done   Colonoscopy  06/30/2030   INFLUENZA VACCINE  Completed   HIV Screening  Completed   Hepatitis B Vaccines 19-59 Average Risk  Aged Out   HPV VACCINES  Aged Out   Meningococcal B Vaccine  Aged Out    Patient Care Team: Sharma Coyer, MD as PCP - General (Family Medicine) Darron Deatrice LABOR, MD as PCP - Cardiology (Cardiology) Dessa, Reyes ORN, MD as Consulting Physician (General Surgery) Dennise Capri, MD as Consulting Physician (Nephrology)  Review of Systems  Last CBC Lab Results  Component Value Date   WBC 7.6 01/14/2024   HGB 12.6 01/14/2024   HCT 38.6 01/14/2024   MCV 86.0 01/14/2024   MCH 28.1 01/14/2024   RDW 14.1 01/14/2024   PLT 287 01/14/2024  Last metabolic panel Lab Results  Component Value Date   GLUCOSE 104 (H) 01/14/2024   NA 135 01/14/2024   K 4.3 01/14/2024   CL 101 01/14/2024   CO2 23 01/14/2024   BUN 15 01/14/2024   CREATININE 1.01 (H) 01/14/2024   GFRNONAA >60 01/14/2024   CALCIUM  9.4 01/14/2024   PHOS 4.2 09/13/2022   PROT 6.6 09/02/2023   ALBUMIN 4.3 09/02/2023   LABGLOB 2.3 09/02/2023   BILITOT 0.2  09/02/2023   ALKPHOS 85 09/02/2023   AST 21 09/02/2023   ALT 7 09/02/2023   ANIONGAP 11 01/14/2024   Last lipids Lab Results  Component Value Date   CHOL 155 09/12/2022   HDL 54 09/12/2022   LDLCALC 91 09/12/2022   TRIG 52 09/12/2022   CHOLHDL 2.9 09/12/2022   Last hemoglobin A1c Lab Results  Component Value Date   HGBA1C 6.2 (H) 11/26/2015   Last thyroid  functions Lab Results  Component Value Date   TSH 0.893 09/02/2023   Last vitamin D  No results found for: 25OHVITD2, 25OHVITD3, VD25OH Last vitamin B12 and Folate No results found for: VITAMINB12, FOLATE   {See past labs  Heme  Chem  Endocrine  Serology  Results Review (optional):1}   Objective    BP 109/67 (BP Location: Left Arm, Patient Position: Sitting, Cuff Size: Normal)   Pulse 66   Ht 5' 5 (1.651 m)   Wt 156 lb (70.8 kg)   SpO2 98%   BMI 25.96 kg/m  BP Readings from Last 3 Encounters:  01/26/24 109/67  01/14/24 115/71  11/13/23 118/70   Wt Readings from Last 3 Encounters:  01/26/24 156 lb (70.8 kg)  01/14/24 163 lb (73.9 kg)  11/13/23 160 lb (72.6 kg)    {See vitals history (optional):1}    Depression Screen    01/26/2024    9:13 AM 11/19/2017    9:58 AM  PHQ 2/9 Scores  PHQ - 2 Score 0 0   No results found for any visits on 01/26/24.   Physical Exam ***    Assessment & Plan      Problem List Items Addressed This Visit     Anxiety   Bunion, left foot   Relevant Orders   Ambulatory referral to Podiatry   Coronary artery disease with unstable angina pectoris (HCC)   Dyslipidemia   Estrogen deficiency   Relevant Orders   DG Bone Density   Female genuine stress incontinence   Relevant Orders   Ambulatory referral to Gynecology   GERD (gastroesophageal reflux disease)   H/O acute myocardial infarction   HLD (hyperlipidemia)   HTN (hypertension) - Primary   Low back pain   OSA on CPAP   Paroxysmal tachycardia (HCC)   Prolapse of vaginal vault after  hysterectomy   Relevant Orders   Ambulatory referral to Gynecology   Takotsubo cardiomyopathy   Vitamin D  deficiency   Other Visit Diagnoses       Immunization due       Relevant Orders   Flu vaccine HIGH DOSE PF(Fluzone Trivalent) (Completed)     Encounter for screening mammogram for malignant neoplasm of breast       Relevant Orders   MM 3D SCREENING MAMMOGRAM BILATERAL BREAST     Chronic high back pain         Pruritic intertrigo       Relevant Medications   nystatin -triamcinolone  ointment (MYCOLOG)       Assessment & Plan Prolapse of vaginal vault after hysterectomy with  stress incontinence She is under gynecological care for vaginal prolapse and stress incontinence post-hysterectomy. - Refer to gynecology for management of prolapse and stress incontinence.  Bunion of left foot Significant pain and irritation from left foot bunion. No prior foot surgeries. - Refer to podiatry for evaluation and management of left foot bunion.  Intertrigo (inframammary yeast rash) Recurrent inframammary yeast rash causing itching and discomfort. Previously treated with a cream, but the rash recurs. - Prescribe topical antifungal cream for inframammary yeast rash.  Essential hypertension Blood pressure is well-controlled at 109/67 mmHg with diltiazem .  Hyperlipidemia Managed with Crestor  40 mg daily.  Gastroesophageal reflux disease (GERD) GERD symptoms are controlled with Protonix  40 mg daily.  Coronary artery disease with paroxysmal tachycardia and Takotsubo cardiomyopathy Recent tachycardia episode led to emergency department visit. Under cardiologist care. Medications include aspirin , diltiazem , Imdur , Ranexa , and sotalol . - Follow up with cardiologist for ongoing management of cardiac conditions.  Anxiety disorder Anxiety managed with clonazepam  0.5 mg at night, which also aids in sleep. Uses CPAP machine.  Vitamin D  deficiency Managed with high-dose vitamin D   supplementation weekly.  General Health Maintenance Overdue for Pap smear. Last mammogram in 2023. Normal bone density scan in 2023. Colonoscopy in February 2025 with a recommendation to repeat in five years. Desires influenza vaccine today. - Order mammogram. - Refer to gynecology for Pap smear. - Order bone density scan. - Administer influenza vaccine.      Return in about 8 weeks (around 03/22/2024).      Rockie Agent, MD  [facility] [phone] (phone) [phone] (fax)  [facility]

## 2024-01-27 ENCOUNTER — Ambulatory Visit: Attending: Cardiology | Admitting: Physician Assistant

## 2024-01-27 ENCOUNTER — Encounter: Payer: Self-pay | Admitting: Cardiology

## 2024-01-27 ENCOUNTER — Encounter: Payer: Self-pay | Admitting: Family Medicine

## 2024-01-27 VITALS — BP 120/70 | HR 70 | Ht 65.0 in | Wt 156.2 lb

## 2024-01-27 DIAGNOSIS — I471 Supraventricular tachycardia, unspecified: Secondary | ICD-10-CM

## 2024-01-27 DIAGNOSIS — R072 Precordial pain: Secondary | ICD-10-CM

## 2024-01-27 DIAGNOSIS — I2542 Coronary artery dissection: Secondary | ICD-10-CM

## 2024-01-27 DIAGNOSIS — I701 Atherosclerosis of renal artery: Secondary | ICD-10-CM

## 2024-01-27 DIAGNOSIS — E785 Hyperlipidemia, unspecified: Secondary | ICD-10-CM

## 2024-01-27 DIAGNOSIS — I5181 Takotsubo syndrome: Secondary | ICD-10-CM

## 2024-01-27 DIAGNOSIS — I10 Essential (primary) hypertension: Secondary | ICD-10-CM | POA: Diagnosis not present

## 2024-01-27 DIAGNOSIS — R002 Palpitations: Secondary | ICD-10-CM | POA: Diagnosis not present

## 2024-01-27 DIAGNOSIS — I480 Paroxysmal atrial fibrillation: Secondary | ICD-10-CM

## 2024-01-27 MED ORDER — METOPROLOL TARTRATE 25 MG PO TABS: 25.0000 mg | ORAL_TABLET | Freq: Every day | ORAL | 3 refills | Status: DC | PRN

## 2024-01-27 NOTE — Assessment & Plan Note (Signed)
 Chronic condition  Continue ranexa  1000mg  BID, ASA 81mg  dailym imdur  30mg  daily  Continue follow up with cardiology as scheduled

## 2024-01-27 NOTE — Assessment & Plan Note (Signed)
 Anxiety disorder, Chronic  Anxiety managed with clonazepam  0.5 mg at night, which also aids in sleep. Uses CPAP machine. - continue current regimen

## 2024-01-27 NOTE — Patient Instructions (Signed)
 Follow up with EP at [facility]  Medication Instructions:  Your physician recommends the following medication changes.  START TAKING: Metoprolol  tartrate 25 mg daily as needed for palpitations   *If you need a refill on your cardiac medications before your next appointment, please call your pharmacy*  Lab Work: No labs ordered today  If you have labs (blood work) drawn today and your tests are completely normal, you will receive your results only by: MyChart Message (if you have MyChart) OR A paper copy in the mail If you have any lab test that is abnormal or we need to change your treatment, we will call you to review the results.  Testing/Procedures: No test ordered today   Follow-Up: At [facility], you and your health needs are our priority.  As part of our continuing mission to provide you with exceptional heart care, our providers are all part of one team.  This team includes your primary Cardiologist (physician) and Advanced Practice Providers or APPs (Physician Assistants and Nurse Practitioners) who all work together to provide you with the care you need, when you need it.  Your next appointment:   3 - 4 month(s)  Provider:   You may see Deatrice Cage, MD or one of the following Advanced Practice Providers on your designated Care Team:   Lonni Meager, NP Lesley Maffucci, PA-C Bernardino Bring, PA-C Cadence Interlaken, PA-C Tylene Lunch, NP Barnie Hila, NP

## 2024-01-27 NOTE — Assessment & Plan Note (Signed)
  Vitamin D  deficiency Chronic condition  Managed with high-dose vitamin D  supplementation weekly. - continue 1.25mg  weekly vitamin D

## 2024-01-27 NOTE — Assessment & Plan Note (Signed)
 Chronic Back Pain  Involving thoracic region and lumbar region  Continue celebrex 100mg  PRN, gabapentin  300mg  at bedtime, prescribed 300mg  during the day as well  Continue duloxetine 30mg  daily

## 2024-01-27 NOTE — Assessment & Plan Note (Signed)
   Bunion of left foot Chronic condition  Significant pain and irritation from left foot bunion. No prior foot surgeries. - Refer to podiatry for evaluation and management of left foot bunion.

## 2024-01-27 NOTE — Assessment & Plan Note (Addendum)
 Chronic condition  Continue to follow up with cardiology as scheduled  Continue Sotalol  80mg  daily, diltiazem  120mg 

## 2024-01-27 NOTE — Assessment & Plan Note (Signed)
  Hyperlipidemia Chronic condition  Managed with Crestor  40 mg daily. - continue crestor  40mg  daily

## 2024-01-27 NOTE — Assessment & Plan Note (Signed)
  Estrogen Deficiency  Chronic condition  Continue estradiol  0.5mg  daily  Referred to GYN for pt to establish with new provider

## 2024-01-27 NOTE — Assessment & Plan Note (Signed)
 Gastroesophageal reflux disease (GERD) Chronic  GERD symptoms are controlled with Protonix  40 mg daily. - continue protonix  40mg  daily

## 2024-01-27 NOTE — Assessment & Plan Note (Signed)
 Essential hypertension Chronic condition  Blood pressure is well-controlled at 109/67 mmHg with diltiazem . - continue diltiazem  120mg  daily

## 2024-01-27 NOTE — Assessment & Plan Note (Signed)
Chronic  Stable with CPAP use  Patient advised to continue CPAP nightly  The patient has had significant benefit from the use of CPAP machine with considerable improvements in quality of life, work production and decrease medical complaints.  The patient will need continued maintenance and care CPAP supplies (including upgrades as deemed appropriate) in order to continue treatment for sleep apnea as this is medically necessary.     

## 2024-01-27 NOTE — Assessment & Plan Note (Signed)
 Chronic condition  Recent tachycardia episode led to emergency department visit. Under cardiologist care. Medications include aspirin  81mg , diltiazem , Imdur  30mg  daily, Ranexa  1000mg  twice daily, and sotalol  80mg  daily. - Follow up with cardiologist for ongoing management of cardiac conditions. - continue current regimen

## 2024-01-27 NOTE — Progress Notes (Signed)
 Cardiology Office Note    Date:  01/27/2024   ID:  ITA FRITZSCHE, DOB July 03, 1955, MRN [id number]  PCP:  Sharma Coyer, MD  Cardiologist:  Deatrice Cage, MD  Electrophysiologist:  None   Chief Complaint: ED follow up  History of Present Illness:   Jenny Lee is a 68 y.o. female with history of spontaneous coronary artery dissection, hypertension, hyperlipidemia, diastolic dysfunction, PSVT, PAF (noted during EP study 03/2023), sleep apnea, and renal artery stenosis s/p PTA in 2018 who presents for ED follow up.    Patient suffered NSTEMI in 2002.  Echo at that time showed normal LV function with apical wall motion abnormality.  Diagnostic catheterization showed an occluded RPDA with appearance highly suggestive of scad.  She also had an abnormality in the mid to distal LAD likely representative of scad.  She required aortic balloon pump placement and was transferred to [facility] at that time and was managed medically without revascularization.  She underwent repeat catheterization by Dr. Nelida in 2017 which showed completely normal coronary arteries with recanalization of the RPDA and normal appearance of the LAD.  There was persistent wall motion abnormality in the inferior apical region.    Patient has been followed by [facility] cardiology for PSVT, which has been managed with sotalol .  In 08/2022, she presented to the emergency department with complaint of tachycardia and chest pain.  Troponin was minimally elevated.  Echo showed normal LV function without significant valvular abnormalities.  In 10/2022, she underwent coronary CTA which showed normal coronary arteries with a calcium  score of 0.  In 03/2023, she underwent electrophysiology study during which she was noted to have short episode of sustained atrial fibrillation.  No other arrhythmias were induced and therefore ablation was not performed.  She subsequently underwent implantable loop recorder placement 06/2023 to further  document potential presence of atrial fibrillation.  Patient was seen in follow-up 08/2023 at which time she had reported some dyspnea for which her PCP had recently performed a stress test which was reportedly normal.  Echo was performed and showed EF 60 to 65%, G1 DD, apical dyskinesis, normal RV function, mild AI, and aortic sclerosis with raphe between the left and right cusp of the aortic valve making it functionally bicuspid/fusion.   Patient was most recently seen in our clinic 11/13/2023 and reported ongoing mild dyspnea on exertion which had been stable for quite some time.  She was otherwise without chest pain, palpitations, PND, orthopnea, dizziness, syncope, edema, or early satiety.  No further testing or medication changes were indicated at that time  Patient was recently seen in the ED 01/14/2024 in the setting of an episode of chest pressure and palpitations which had occurred the night prior and resolved at time of evaluation.  This reportedly had resolved with her prescribed clonazepam .  Workup including labs, troponin, EKG, chest x-ray, and CTA PE were reassuring.  She remained asymptomatic and was discharged in stable condition with recommendation for cardiology follow-up.  Patient presents today for follow-up after recent ED visit, detailed above.  She recalls the events leading up to her presenting to the ED.  She reports that she was resting when she had a sudden onset of chest fluttering and heart racing.  This was associated with lightheadedness, shortness of breath, and chest pressure.  She also noted that her hands were sweaty.  She took clonazepam  with some relief, although she felt her heart continue to race.  She was able to calm down and go  to sleep.  The next morning, her symptoms had resolved, although she felt fatigued.  She called our office to describe her symptoms and was instructed to report to the ED.  Her husband wonders if this episode could be related to stress, as she  had several phone calls to make the day of the episode. Today, she denies any recurrence of symptoms.  She is fairly functional at baseline and denies any exertional dyspnea or angina.  She denies chest pain, shortness of breath, lightheadedness, dizziness, palpitations, and lower extremity swelling.    Labs independently reviewed: 12/2023-Hgb 12.6, HCT 38.6, platelets 287, sodium 135, potassium 4.3, BUN 15, creatinine 1.01, troponin negative x 2 06/2023- TC 241, TG 140, HDL 56, LDL 160  Objective   Past Medical History:  Diagnosis Date   Acute myocardial infarction (HCC) 12/04/2015   Anxiety    a.) on BZO (clonazepam ) PRN   Atypical angina (HCC)    CAD (coronary artery disease) 11/2000   a.) s/p NSTEMI 11/2000 --> LHC 12/22/2000 --> 100% mPDA -->  no PCI placed; SCAD of dLAD; b.) LHC 04/26/2003: normal coronaries; c.) LHC 11/26/2015: EF 35%, anteroapical aneurysm, normal coronary arteries; d.) cCTA 11/12/2022: Ca2+ score 0; e. 08/2023 reportedly nl stress test.   Chronic pain syndrome    Diastolic dysfunction    a.) TTE 09/12/2022: EF 60-65%, AoV sclerosis, mild MR/AR, G1DD; b. 09/2023 Echo: EF of 60 to 65%, grade 1 diastolic dysfunction, apical dyskinesis, normal RV function, mild AI and aortic sclerosis with raphe between the left and right cusp of the aortic valve making it functionally bicuspid/fusion.   Diverticulosis    Dyspnea    Female genuine stress incontinence 08/15/2014   GERD (gastroesophageal reflux disease)    H. pylori infection    History of 2019 novel coronavirus disease (COVID-19) 05/21/2020   HLD (hyperlipidemia)    HTN (hypertension)    Insomnia    a.) takes melatonin   Kidney cysts    Mastalgia 10/06/2012   NSTEMI (non-ST elevated myocardial infarction) (HCC) 11/2000   a.) LHC 12/22/2000 --> 100% mPDA --> no PCI (stents) placed.  Procedure complicated by (+) SCAD of dLAD with resulting ST elevation --> IABP placed and patient transferred to [facility]   Obstructive sleep  apnea on CPAP    PAF (paroxysmal atrial fibrillation) (HCC)    a. 03/2023 noted on EP study ([facility])-->s/p ILR 06/2023.   Paroxysmal SVT (supraventricular tachycardia) (HCC)    a. on sotalol  - followed by [facility] EP; b. 03/2023 - not induced on EP study.   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 02/2017 s/p PTA of RRA.   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   Syncope 11/26/2015   Takotsubo cardiomyopathy    a.) LHC 11/26/2015: EF 35%; b.) TTE 09/12/2022: EF 60-65%; c. EF of 60 to 65%, grade 1 diastolic dysfunction, apical dyskinesis, normal RV function.   Vitamin D  deficiency     Current Medications: Current Meds  Medication Sig   acetaminophen  (TYLENOL ) 500 MG tablet Take 2 tablets (1,000 mg total) by mouth every 6 (six) hours as needed for mild pain.   aspirin  81 MG tablet Take 81 mg by mouth daily.   celecoxib (CELEBREX) 100 MG capsule Take  100 mg by mouth 2 (two) times daily.   cetirizine (ZYRTEC) 10 MG tablet Take 10 mg by mouth daily.   clonazePAM  (KLONOPIN ) 0.5 MG tablet Take 1 tablet (0.5 mg total) by mouth 2 (two) times daily for 5 days. (Patient taking differently: Take 0.5 mg by mouth daily.)   diltiazem  (CARDIZEM  CD) 120 MG 24 hr capsule Take 120 mg by mouth daily.   DULoxetine (CYMBALTA) 30 MG capsule Take 30 mg by mouth daily.   estradiol  (ESTRACE ) 0.5 MG tablet TAKE 1 TABLET BY MOUTH EVERY DAY   furosemide  (LASIX ) 20 MG tablet Take 10 mg by mouth daily as needed.   gabapentin  (NEURONTIN ) 300 MG capsule Take by mouth. Take 1 tablet am and 3 tablets pm daily.   isosorbide  mononitrate (IMDUR ) 30 MG 24 hr tablet Take 1 tablet (30 mg total) by mouth 2 (two) times daily.   Melatonin 10 MG TABS Take 10 mg by mouth at bedtime.     metoprolol  tartrate (LOPRESSOR ) 25 MG tablet Take 1 tablet (25 mg total) by mouth daily as needed (palpitations).   nystatin -triamcinolone  ointment (MYCOLOG) Apply 1 Application topically 2 (two) times daily. Apply for no longer than 2 weeks at a time.   ondansetron  (ZOFRAN ) 4 MG tablet Take 1 tablet (4 mg total) by mouth every 8 (eight) hours as needed for nausea.   pantoprazole  (PROTONIX ) 40 MG tablet 40 mg as needed.   ranolazine  (RANEXA ) 1000 MG SR tablet Take 1 tablet (1,000 mg total) by mouth 2 (two) times daily.   rosuvastatin  (CRESTOR ) 40 MG tablet TAKE 1 TABLET BY MOUTH EVERY DAY   sotalol  (BETAPACE ) 80 MG tablet Take 1 tablet by mouth daily.   tiZANidine  (ZANAFLEX ) 2 MG tablet Take 2 mg by mouth every 8 (eight) hours as needed.   Vitamin D , Ergocalciferol , (DRISDOL ) 1.25 MG (50000 UNIT) CAPS capsule TAKE ONE CAPSULE BY MOUTH EVERY WEEK    Allergies:   No known allergies   Social History   Socioeconomic History   Marital status: Married    Spouse name: Masoud   Number of children: 4   Years of education: Not on file   Highest education level: Not on file  Occupational History   Not on file  Tobacco Use   Smoking status: Never   Smokeless tobacco: Never  Vaping Use   Vaping status: Never Used  Substance and Sexual Activity   Alcohol use: Not Currently    Comment: occasional wine   Drug use: No   Sexual activity: Yes  Other Topics Concern   Not on file  Social History Narrative   Lives at home with husband.   Social Drivers of Health   Financial Resource Strain: Medium Risk (07/12/2021)   Received from [facility]   Overall Financial Resource Strain (CARDIA)    Difficulty of Paying Living Expenses: Somewhat hard  Food Insecurity: No Food Insecurity (09/12/2022)   Hunger Vital Sign    Worried About Running Out of Food in the Last Year: Never true    Ran Out of Food in the Last Year: Never true  Transportation Needs: No Transportation Needs (09/12/2022)    PRAPARE - Administrator, Civil Service (Medical): No    Lack of Transportation (Non-Medical): No  Physical Activity: Insufficiently Active (07/12/2021)   Received from [facility]   Exercise Vital Sign    On average, how many days per week do you engage in moderate to strenuous exercise (like a brisk  walk)?: 3 days    On average, how many minutes do you engage in exercise at this level?: 30 min  Stress: Stress Concern Present (07/12/2021)   Received from [facility] - Occupational Stress Questionnaire    Feeling of Stress : Very much  Social Connections: Moderately Isolated (07/12/2021)   Received from [facility]   Social Connection and Isolation Panel    In a typical week, how many times do you talk on the phone with family, friends, or neighbors?: More than three times a week    How often do you get together with friends or relatives?: More than three times a week    How often do you attend church or religious services?: Never    Do you belong to any clubs or organizations such as church groups, unions, fraternal or athletic groups, or school groups?: No    How often do you attend meetings of the clubs or organizations you belong to?: Never    Are you married, widowed, divorced, separated, never married, or living with a partner?: Married     Family History:  The patient's family history includes Breast cancer (age of onset: 5) in her sister; Hypertension in her mother.  ROS:   12-point review of systems is negative unless otherwise noted in the HPI.  EKGs/Other Studies Reviewed:    Studies reviewed were summarized above. The additional studies were reviewed today:  09/2023 Echo complete 1. Left ventricular ejection fraction, by estimation, is 60 to 65%. The  left ventricle has normal function. The left ventricle demonstrates  regional wall motion abnormalities (see scoring diagram/findings for  description). Left  ventricular diastolic  parameters are consistent with Grade I diastolic dysfunction (impaired  relaxation). There is dyskinesis of the left ventricular, apical segment.   2. Right ventricular systolic function is normal. The right ventricular  size is normal.   3. The mitral valve is normal in structure. No evidence of mitral valve  regurgitation.   4. Appears to be an incomplete raphe/fusion between L-R cusps of aortic  valve(functionally bicuspid). The aortic valve is calcified. Aortic valve  regurgitation is mild. Aortic valve sclerosis/calcification is present,  without any evidence of aortic  stenosis. Aortic valve mean gradient measures 6.0 mmHg. Aortic valve Vmax  measures 1.70 m/s.   5. The inferior vena cava is normal in size with greater than 50%  respiratory variability, suggesting right atrial pressure of 3 mmHg.   07/2023 SPECT MYO (Outside records) - Reportedly normal   10/2022 Coronary CTA 1. Coronary calcium  score of 0. 2. Normal coronary origin with right dominance. 3. No evidence of CAD. 4. CAD-RADS 0. Consider non-atherosclerotic causes of chest pain.  EKG:  EKG personally reviewed by me today EKG Interpretation Date/Time:  Wednesday January 27 2024 11:00:52 EDT Ventricular Rate:  70 PR Interval:  162 QRS Duration:  76 QT Interval:  368 QTC Calculation: 397 R Axis:   40  Text Interpretation: Normal sinus rhythm Nonspecific T wave abnormality When compared with ECG of 14-Jan-2024 13:31, No significant change was found Confirmed by Lorene Sinclair ([id number]) on 01/27/2024 11:16:21 AM  PHYSICAL EXAM:    VS:  BP 120/70 (BP Location: Left Arm, Patient Position: Sitting, Cuff Size: Normal)   Pulse 70   Ht 5' 5 (1.651 m)   Wt 156 lb 3.2 oz (70.9 kg)   SpO2 96%   BMI 25.99 kg/m   BMI: Body mass index is 25.99 kg/m.  GEN: Well nourished, well developed in no acute distress NECK: No JVD; No carotid bruits CARDIAC: RRR, no murmurs, rubs, gallops RESPIRATORY:  Clear  to auscultation without rales, wheezing or rhonchi  ABDOMEN: Soft, non-tender, non-distended EXTREMITIES: No edema; No deformity  Wt Readings from Last 3 Encounters:  01/27/24 156 lb 3.2 oz (70.9 kg)  01/26/24 156 lb (70.8 kg)  01/14/24 163 lb (73.9 kg)       ASSESSMENT & PLAN:   Palpitations Chest pressure - Recent ED visit 8/21 for palpitations with associated shortness of breath, chest pressure, and lightheadedness.  Workup was reassuring including negative EKG and troponins.  Patient denies recurrence of symptoms.  Coronary CTA 10/2022 revealed calcium  score of 0 with no evidence of CAD.  She had a SPECT exercise stress test done 07/2023 at an outside facility which was reportedly normal.  Suspect that this episode was arrhythmia genic in nature rather than ischemic, given negative troponin and recent negative ischemic evaluation.  Patient does have a loop recorder which was implanted by [facility] EP, unfortunately I am unable to review the remote device checks.  Recommend arranging an appointment with [facility] EP for further evaluation and management.  Will give the patient metoprolol  to tartrate 25 mg daily as needed for palpitations.  PSVT Paroxysmal atrial fibrillation - Followed closely by EP at [facility].  Underwent EP study in 03/2023 which did not show any inducible SVT but did show brief episode of atrial fibrillation.  She now has an implantable loop recorder which was placed 06/2023 to assess for subclinical atrial fibrillation.  CHA2DS2-VASc of 3, not currently requiring anticoagulation given low burden per [facility] EP.  She is continued on diltiazem  120 mg daily and sotalol  80 mg daily, managed by [facility] EP.  Recommend arranging follow-up with [facility] EP as above given recent symptoms.  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  Repeat catheterization in 2017 showed normal coronary arteries.  Coronary CTA 10/2022 with no evidence of CAD.  She has a long history of intermittent chest pain and  dyspnea.  Aside from recent episode detailed above, she has been largely chest pain-free.  She is continued on Imdur , Ranexa , aspirin , and statin therapy.  Primary hypertension - Blood pressure well-controlled.  She is continued on diltiazem  and Imdur .  Hyperlipidemia - Most recent lipid panel with LDL 160, followed by primary care.  She is continued on statin therapy.  Right renal artery stenosis - S/p prior PTA.  Blood pressure normal.  Creatinine 1.01 last month.  Takotsubo cardiomyopathy - Setting of scad in 2002.  Most recent echo 09/2023 LV systolic function.  She is euvolemic on exam.  Continued on Lasix  20 mg as needed for lower extremity swelling.    Disposition: As needed metoprolol  tartrate 25 mg daily for palpitations. Arrange appointment with [facility] EP for further evaluation/management of palpitations. F/u with Dr. Darron or an APP in 3-4 months.   Medication Adjustments/Labs and Tests Ordered: Current medicines are reviewed at length with the patient today.  Concerns regarding medicines are outlined above. Medication changes, Labs and Tests ordered today are summarized above and listed in the Patient Instructions accessible in Encounters.   Bonney Lesley Maffucci, PA-C 01/27/2024 12:57 PM     [facility] [place] [address] [place], [place] [address] [phone]

## 2024-01-27 NOTE — Assessment & Plan Note (Signed)
 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 - Refer to gynecology for management of prolapse and stress incontinence.

## 2024-02-03 ENCOUNTER — Ambulatory Visit (INDEPENDENT_AMBULATORY_CARE_PROVIDER_SITE_OTHER)

## 2024-02-03 ENCOUNTER — Ambulatory Visit: Admitting: Podiatry

## 2024-02-03 VITALS — Ht 65.0 in | Wt 156.2 lb

## 2024-02-03 DIAGNOSIS — B353 Tinea pedis: Secondary | ICD-10-CM | POA: Diagnosis not present

## 2024-02-03 DIAGNOSIS — M21612 Bunion of left foot: Secondary | ICD-10-CM | POA: Diagnosis not present

## 2024-02-03 DIAGNOSIS — B351 Tinea unguium: Secondary | ICD-10-CM | POA: Diagnosis not present

## 2024-02-03 MED ORDER — TERBINAFINE HCL 250 MG PO TABS: 250.0000 mg | ORAL_TABLET | Freq: Every day | ORAL | 0 refills | Status: AC

## 2024-02-03 NOTE — Progress Notes (Signed)
  Subjective:  Patient ID: Jenny Lee, female    DOB: Jul 20, 1955,  MRN: [id number]  Chief Complaint  Patient presents with   Bunions    RM 3  Pt is here for left foot bunion pain.    Discussed the use of AI scribe software for clinical note transcription with the patient, who gave verbal consent to proceed.  History of Present Illness Jenny Lee is a 68 year old female who presents with painful bunion and nail fungus.  She experiences significant pain from a bunion on her big toe, which worsens with wearing sandals or high-heeled shoes. The pain is severe, especially when pressure is applied, and is accompanied by redness and inflammation.  She has nail fungus on her big toe, which she describes as looking 'terrible.' The condition has been persistent and difficult to treat, causing concern about the toenail's appearance and management. She is not currently on any specific medication for the nail fungus.  Additionally, she has dry skin on her feet, which she attributes to taking multiple medications. She reports discomfort and concern about the overall condition of her feet and the impact on her ability to wear certain types of shoes.      Objective:    Physical Exam VASCULAR: DP and PT pulse palpable. Foot is warm and well-perfused. Capillary fill time is brisk. DERMATOLOGIC: Onychomycosis of the left hallux nail. Bunion on the foot is red and inflamed. Normal skin turgor, texture, and temperature.  Tinea pedis moccasin distribution bilateral NEUROLOGIC: Normal sensation to light touch and pressure. No paresthesias on examination. 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 Good range of motion of the MTP joint, tenderness of the bunion medially. Semi-reducible hammer toe contractures of the lesser digits. No ecchymosis or bruising. No gross deformity. No pain to palpation.       Results  Radiographs  taken of the left foot show moderate to severe hallux valgus deformity with increased intermetatarsal angle from plain rotation and abduction of hallux   Assessment:   1. Bunion, left foot   2. Onychomycosis   3. Tinea pedis of both feet      Plan:  Patient was evaluated and treated and all questions answered.  Assessment and Plan Assessment & Plan Left hallux valgus (bunion) with associated pain and functional limitation Moderate to severe hallux valgus deformity with hypermobility of the first tarsal metatarsal joint. Tenderness medially. Pain exacerbated by narrow shoes due to nerve pressure. - Recommend wearing wider shoes to reduce pressure on the bunion. - Consider using a bunion shield to alleviate pressure. - Discuss surgical options if non-surgical measures are ineffective. - Prescribe Tylenol  for pain management. - Avoid NSAIDs due to potential contraindications with cardiology medications.  Hammer toe deformities of lesser toes, left foot Semi-reducible hammer toe contractures.  Onychomycosis of left hallux nail Nail fungus confirmed. Oral antifungal medication is effective with an 80-90% success rate. Potential side effects include GI symptoms and muscle cramping, especially in patients on statins. No contraindications with current medications. - Prescribe oral antifungal medication. - Monitor for side effects such as nausea, cramping, and diarrhea. - Reassess if side effects occur and consider discontinuation.      Return in about 3 months (around 05/04/2024) for follow up after nail fungus treatment.

## 2024-02-03 NOTE — Patient Instructions (Addendum)
  VISIT SUMMARY: Today, we discussed the pain and discomfort you are experiencing due to a bunion on your big toe, nail fungus, and dry skin on your feet. We reviewed your symptoms and discussed treatment options to help manage these conditions.  YOUR PLAN: -LEFT HALLUX VALGUS (BUNION): A bunion is a bony bump that forms on the joint at the base of your big toe, causing pain and discomfort. To manage this, I recommend wearing wider shoes to reduce pressure on the bunion and using a bunion shield (this can be bought on [employer]). If these measures are not effective, we can discuss surgical options. For pain relief, take Tylenol  as needed, but avoid NSAIDs due to potential interactions with your heart medications.  -ONYCHOMYCOSIS (NAIL FUNGUS): Nail fungus is an infection that affects the nails, causing them to become discolored and thickened. I have prescribed an oral antifungal medication, which has a high success rate. Be aware of potential side effects like nausea, cramping, and diarrhea. If you experience any of these, please let me know so we can reassess your treatment.  INSTRUCTIONS: Please follow the recommendations for wearing wider shoes and using a bunion shield. Take Tylenol  for pain as needed. Start the prescribed oral antifungal medication and monitor for any side effects. If you experience any side effects or if your symptoms do not improve, please schedule a follow-up appointment.                      Contains text generated by [employer].                                 Contains text generated by [employer].

## 2024-03-23 ENCOUNTER — Ambulatory Visit (INDEPENDENT_AMBULATORY_CARE_PROVIDER_SITE_OTHER): Admitting: Family Medicine

## 2024-03-23 ENCOUNTER — Encounter: Payer: Self-pay | Admitting: Family Medicine

## 2024-03-23 VITALS — BP 129/70 | HR 63 | Temp 97.9°F | Ht 65.0 in | Wt 154.3 lb

## 2024-03-23 DIAGNOSIS — Z1231 Encounter for screening mammogram for malignant neoplasm of breast: Secondary | ICD-10-CM

## 2024-03-23 DIAGNOSIS — L299 Pruritus, unspecified: Secondary | ICD-10-CM

## 2024-03-23 DIAGNOSIS — R1011 Right upper quadrant pain: Secondary | ICD-10-CM

## 2024-03-23 DIAGNOSIS — R351 Nocturia: Secondary | ICD-10-CM

## 2024-03-23 DIAGNOSIS — Z23 Encounter for immunization: Secondary | ICD-10-CM | POA: Diagnosis not present

## 2024-03-23 DIAGNOSIS — F419 Anxiety disorder, unspecified: Secondary | ICD-10-CM

## 2024-03-23 MED ORDER — HYDROXYZINE PAMOATE 25 MG PO CAPS: 25.0000 mg | ORAL_CAPSULE | Freq: Every evening | ORAL | 0 refills | Status: DC | PRN

## 2024-03-23 MED ORDER — CLONAZEPAM 0.5 MG PO TABS: 0.5000 mg | ORAL_TABLET | Freq: Every day | ORAL | 0 refills | Status: DC

## 2024-03-23 NOTE — Progress Notes (Signed)
 Established patient visit   Patient: Jenny Lee   DOB: [birth year]   68 y.o. Female  MRN: [id number] Visit Date: 03/23/2024  Today's healthcare provider: Rockie Agent, MD   Chief Complaint  Patient presents with   Medical Management of Chronic Issues    Patient is present for 8 week htn follow up, reports doing well and not currently checking BP at home.  Would like to discuss prevnar vaccine    Hypertension   itching     Patient reports skin itching unsure of onset on trunk and extremities.   Abdominal Pain    Patient reports RUQ abd pain and states it hurts mostly at nighttime, onset over one month ago, reports some nausea with this as well. Patient is concerned about her liver    Subjective     HPI     Medical Management of Chronic Issues    Additional comments: Patient is present for 8 week htn follow up, reports doing well and not currently checking BP at home.  Would like to discuss prevnar vaccine         itching     Additional comments: Patient reports skin itching unsure of onset on trunk and extremities.        Abdominal Pain    Additional comments: Patient reports RUQ abd pain and states it hurts mostly at nighttime, onset over one month ago, reports some nausea with this as well. Patient is concerned about her liver       Last edited by Cherry Chiquita HERO, CMA on 03/23/2024  9:43 AM.       Discussed the use of AI scribe software for clinical note transcription with the patient, who gave verbal consent to proceed.  History of Present Illness Jenny Lee is a 68 year old female who presents for follow-up.  She experiences persistent pruritus affecting her trunk, extremities, and face, without any visible rash. The itching is severe enough to disrupt her sleep. She uses Aveeno moisturizer and Dove soap, and notes that the itching is not due to dry skin. No associated rash with the itching.  She reports right upper quadrant  abdominal pain that began a month ago, primarily occurring at night and sometimes accompanied by nausea. She is concerned about her liver and is scheduled to see a gastroenterologist to evaluate for H. pylori infection, which has been resistant to previous treatments. She has been advised to stop taking pantoprazole  for three days as part of this evaluation.  She takes clonazepam  0.5 mg at night for anxiety, which she finds helpful.  She mentions a history of osteoporosis with S1 and L5 involvement, as diagnosed by a neurosurgeon after an MRI and X-ray. She also notes occasional strong cramps, which she describes as painful and possibly related to gas or air.     Past Medical History:  Diagnosis Date   Acute myocardial infarction (HCC) 12/04/2015   Anxiety    a.) on BZO (clonazepam ) PRN   Atypical angina    CAD (coronary artery disease) 11/2000   a.) s/p NSTEMI 11/2000 --> LHC 12/22/2000 --> 100% mPDA -->  no PCI placed; SCAD of dLAD; b.) LHC 04/26/2003: normal coronaries; c.) LHC 11/26/2015: EF 35%, anteroapical aneurysm, normal coronary arteries; d.) cCTA 11/12/2022: Ca2+ score 0; e. 08/2023 reportedly nl stress test.   Chronic pain syndrome    Diastolic dysfunction    a.) TTE 09/12/2022: EF 60-65%, AoV sclerosis, mild MR/AR, G1DD; b. 09/2023 Echo: EF  of 60 to 65%, grade 1 diastolic dysfunction, apical dyskinesis, normal RV function, mild AI and aortic sclerosis with raphe between the left and right cusp of the aortic valve making it functionally bicuspid/fusion.   Diverticulosis    Dyspnea    Female genuine stress incontinence 08/15/2014   GERD (gastroesophageal reflux disease)    H. pylori infection    History of 2019 novel coronavirus disease (COVID-19) 05/21/2020   HLD (hyperlipidemia)    HTN (hypertension)    Insomnia    a.) takes melatonin   Kidney cysts    Mastalgia 10/06/2012   NSTEMI (non-ST elevated myocardial infarction) (HCC) 11/2000   a.) LHC 12/22/2000 --> 100% mPDA -->  no PCI (stents) placed.  Procedure complicated by (+) SCAD of dLAD with resulting ST elevation --> IABP placed and patient transferred to [facility]   Obstructive sleep apnea on CPAP    PAF (paroxysmal atrial fibrillation) (HCC)    a. 03/2023 noted on EP study ([facility])-->s/p ILR 06/2023.   Paroxysmal SVT (supraventricular tachycardia)    a. on sotalol  - followed by [facility] EP; b. 03/2023 - not induced on EP study.   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 02/2017 s/p PTA of RRA.   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   Syncope 11/26/2015   Takotsubo cardiomyopathy    a.) LHC 11/26/2015: EF 35%; b.) TTE 09/12/2022: EF 60-65%; c. EF of 60 to 65%, grade 1 diastolic dysfunction, apical dyskinesis, normal RV function.   Vitamin D  deficiency     Medications: Outpatient Medications Prior to Visit  Medication Sig   acetaminophen  (TYLENOL ) 500 MG tablet Take 2 tablets (1,000 mg total) by mouth every 6 (six) hours as needed for mild pain.   aspirin  81 MG tablet Take 81 mg by mouth daily.   celecoxib (CELEBREX) 100 MG capsule Take 100 mg by mouth 2 (two) times daily.   cetirizine (ZYRTEC) 10 MG tablet Take 10 mg by mouth daily.   diltiazem  (CARDIZEM  CD) 120 MG 24 hr capsule Take 120 mg by mouth daily.   DULoxetine (CYMBALTA) 30 MG capsule Take 30 mg by mouth daily.   estradiol  (ESTRACE ) 0.5 MG tablet TAKE 1 TABLET BY MOUTH EVERY DAY   furosemide  (LASIX ) 20 MG tablet Take 10 mg by mouth daily as needed.   gabapentin  (NEURONTIN ) 300 MG capsule Take by mouth. Take 1 tablet am and 3 tablets pm daily.   isosorbide  mononitrate (IMDUR ) 30 MG 24 hr tablet Take 1 tablet (30 mg total) by mouth 2 (two) times daily.   Melatonin 10 MG TABS Take 10 mg by mouth at bedtime.     metoprolol  tartrate (LOPRESSOR ) 25 MG tablet Take 1 tablet (25 mg total) by mouth daily as needed (palpitations).   nystatin -triamcinolone  ointment (MYCOLOG) Apply 1 Application topically 2 (two) times daily. Apply for no longer than 2 weeks at a time.   ondansetron  (ZOFRAN ) 4 MG tablet Take 1 tablet (4 mg total) by mouth every 8 (eight) hours as needed for nausea.   pantoprazole  (PROTONIX ) 40 MG tablet 40 mg as needed.   ranolazine  (RANEXA ) 1000 MG SR tablet Take 1 tablet (1,000 mg total) by mouth 2 (two) times daily.   rosuvastatin  (CRESTOR ) 40 MG tablet TAKE 1  TABLET BY MOUTH EVERY DAY   sotalol  (BETAPACE ) 80 MG tablet Take 1 tablet by mouth daily.   terbinafine  (LAMISIL ) 250 MG tablet Take 1 tablet (250 mg total) by mouth daily.   tiZANidine  (ZANAFLEX ) 2 MG tablet Take 2 mg by mouth every 8 (eight) hours as needed.   Vitamin D , Ergocalciferol , (DRISDOL ) 1.25 MG (50000 UNIT) CAPS capsule TAKE ONE CAPSULE BY MOUTH EVERY WEEK   [DISCONTINUED] clonazePAM  (KLONOPIN ) 0.5 MG tablet Take 1 tablet (0.5 mg total) by mouth 2 (two) times daily for 5 days. (Patient taking differently: Take 0.5 mg by mouth daily.)   No facility-administered medications prior to visit.    Review of Systems  Last CBC Lab Results  Component Value Date   WBC 7.6 01/14/2024   HGB 12.6 01/14/2024   HCT 38.6 01/14/2024   MCV 86.0 01/14/2024   MCH 28.1 01/14/2024   RDW 14.1 01/14/2024   PLT 287 01/14/2024   Last metabolic panel Lab Results  Component Value Date   GLUCOSE 104 (H) 01/14/2024   NA 135 01/14/2024   K 4.3 01/14/2024   CL 101 01/14/2024   CO2 23 01/14/2024   BUN 15 01/14/2024   CREATININE 1.01 (H) 01/14/2024   GFRNONAA >60 01/14/2024   CALCIUM  9.4 01/14/2024   PHOS 4.2 09/13/2022   PROT 6.6 09/02/2023   ALBUMIN 4.3 09/02/2023   LABGLOB 2.3 09/02/2023   BILITOT 0.2 09/02/2023   ALKPHOS 85 09/02/2023   AST 21 09/02/2023   ALT 7 09/02/2023   ANIONGAP 11 01/14/2024   Last lipids Lab Results   Component Value Date   CHOL 155 09/12/2022   HDL 54 09/12/2022   LDLCALC 91 09/12/2022   TRIG 52 09/12/2022   CHOLHDL 2.9 09/12/2022   Last thyroid  functions Lab Results  Component Value Date   TSH 0.893 09/02/2023   Last vitamin D  No results found for: 25OHVITD2, 25OHVITD3, VD25OH      Objective    BP 129/70 (BP Location: Right Arm, Patient Position: Sitting, Cuff Size: Normal)   Pulse 63   Temp 97.9 F (36.6 C) (Oral)   Ht 5' 5 (1.651 m)   Wt 154 lb 4.8 oz (70 kg)   SpO2 98%   BMI 25.68 kg/m   BP Readings from Last 3 Encounters:  03/23/24 129/70  01/27/24 120/70  01/26/24 109/67   Wt Readings from Last 3 Encounters:  03/23/24 154 lb 4.8 oz (70 kg)  02/03/24 156 lb 3.2 oz (70.9 kg)  01/27/24 156 lb 3.2 oz (70.9 kg)      Physical Exam  Physical Exam ABDOMEN: Abdomen soft, non-distended, no abdominal masses palpated, hyperactive bowel sounds    No results found for any visits on 03/23/24.  Assessment & Plan     Problem List Items Addressed This Visit     Anxiety   Relevant Medications   clonazePAM  (KLONOPIN ) 0.5 MG tablet   hydrOXYzine (VISTARIL) 25 MG capsule   Other Visit Diagnoses       Encounter for screening mammogram for malignant neoplasm of breast    -  Primary   Relevant Orders   MM 3D SCREENING MAMMOGRAM BILATERAL BREAST     Nocturia         Generalized pruritus       Relevant Medications   hydrOXYzine (VISTARIL) 25 MG capsule   Other Relevant Orders   Bile acids, total   Comprehensive Metabolic Panel (CMET)     RUQ pain  Relevant Orders   Lipase   Comprehensive Metabolic Panel (CMET)   Hepatitis, Acute       Assessment and Plan Assessment & Plan Right upper quadrant abdominal pain Associated with with associated nocturia and nausea, under evaluation for hepatic and gastrointestinal causes Right upper quadrant abdominal pain, primarily nocturnal, with associated nausea and nocturia. Differential includes hepatic  and gastrointestinal causes. Concerns about liver function due to associated symptoms. - Ordered liver function tests and bile acid levels - Continue follow-up with gastroenterology for further evaluation of abdominal pain and H. pylori infection  Generalized pruritus, Chronic condition  under evaluation for hepatic and metabolic etiologies Generalized pruritus without associated rash, affecting trunk and extremities. No obvious dermatological findings on examination. - Ordered liver function tests and bile acid levels - Prescribed hydroxyzine 25 mg at bedtime for pruritus  Chronic hypertension, well controlled Chronic hypertension is well controlled with current blood pressure reading of 129/70 mmHg. - continue Diltiazem  120mg  daily, metoprolol  25mg  daily PRN for palpitations  -Follow up with cardiology    Anxiety disorder Chronic, stable on clonazepam  Anxiety disorder is well-managed with clonazepam , taken primarily at night. - Continue clonazepam  0.5mg  at bedtime as needed   Helicobacter pylori infection Chronic condition  under evaluation by gastroenterology H. pylori infection is resistant to previous treatments. Currently under evaluation by gastroenterology. Advised to stop pantoprazole  for three days as per gastroenterology's recommendation. - Continue follow-up with gastroenterology for H. pylori management  General Health Maintenance Discussion about pneumonia vaccination. She has already received the flu shot. Pneumonia vaccine side effects include redness, irritation at the injection site, and potential flu-like symptoms, but these are generally mild. - Administered pneumonia vaccine today     Return in about 3 months (around 06/23/2024) for Chronic F/U, Insomnia, Anxiety.         Rockie Agent, MD  [facility] [phone] (phone) [phone] (fax)  [facility]

## 2024-03-23 NOTE — Patient Instructions (Signed)
 [facility] at [facility] [address] [place],  [place]  [address] Main: [phone]     To keep you healthy, please keep in mind the following health maintenance items that you are due for:   Health Maintenance Due  Topic Date Due   Medicare Annual Wellness (AWV)  Never done   Zoster Vaccines- Shingrix (1 of 2) Never done   DTaP/Tdap/Td (2 - Tdap) 01/30/2004   Pneumococcal Vaccine: 50+ Years (2 of 2 - PCV) 11/26/2016   Mammogram  07/18/2021   DEXA SCAN  Never done     Best Wishes,   Dr. Lang

## 2024-03-25 LAB — LIPASE: Lipase: 38 U/L (ref 14–72)

## 2024-03-25 LAB — HCV INTERPRETATION

## 2024-03-25 LAB — BILE ACIDS, TOTAL: Bile Acids Total: <1 umol/L (ref 0.0–10.0)

## 2024-03-25 LAB — ACUTE VIRAL HEPATITIS (HAV, HBV, HCV)
HCV Ab: NONREACTIVE
Hep A IgM: NEGATIVE
Hep B C IgM: NEGATIVE
Hepatitis B Surface Ag: NEGATIVE

## 2024-03-25 LAB — COMPREHENSIVE METABOLIC PANEL WITH GFR
ALT: 7 IU/L (ref 0–32)
AST: 19 IU/L (ref 0–40)
Albumin: 4.7 g/dL (ref 3.9–4.9)
Alkaline Phosphatase: 88 IU/L (ref 49–135)
BUN/Creatinine Ratio: 14 (ref 12–28)
BUN: 14 mg/dL (ref 8–27)
Bilirubin Total: 0.3 mg/dL (ref 0.0–1.2)
CO2: 24 mmol/L (ref 20–29)
Calcium: 9.8 mg/dL (ref 8.7–10.3)
Chloride: 98 mmol/L (ref 96–106)
Creatinine, Ser: 1.02 mg/dL — ABNORMAL HIGH (ref 0.57–1.00)
Globulin, Total: 2.8 g/dL (ref 1.5–4.5)
Glucose: 86 mg/dL (ref 70–99)
Potassium: 4.6 mmol/L (ref 3.5–5.2)
Sodium: 137 mmol/L (ref 134–144)
Total Protein: 7.5 g/dL (ref 6.0–8.5)
eGFR: 61 mL/min/1.73 (ref >59)

## 2024-03-27 ENCOUNTER — Ambulatory Visit: Payer: Self-pay | Admitting: Family Medicine

## 2024-03-30 ENCOUNTER — Ambulatory Visit (INDEPENDENT_AMBULATORY_CARE_PROVIDER_SITE_OTHER): Admitting: Emergency Medicine

## 2024-03-30 VITALS — Ht 65.0 in | Wt 154.0 lb

## 2024-03-30 DIAGNOSIS — Z0000 Encounter for general adult medical examination without abnormal findings: Secondary | ICD-10-CM

## 2024-03-30 NOTE — Patient Instructions (Signed)
 Jenny Lee,  Thank you for taking the time for your Medicare Wellness Visit. I appreciate your continued commitment to your health goals. Please review the care plan we discussed, and feel free to reach out if I can assist you further.  Please note that Annual Wellness Visits do not include a physical exam. Some assessments may be limited, especially if the visit was conducted virtually. If needed, we may recommend an in-person follow-up with your provider.  Ongoing Care Seeing your primary care provider every 3 to 6 months helps us  monitor your health and provide consistent, personalized care.   Referrals If a referral was made during today's visit and you haven't received any updates within two weeks, please contact the referred provider directly to check on the status.  Recommended Screenings:  You may get the tetanus and shingles vaccines at your local pharmacy at your convenience.  Please call to schedule your mammogram and/or bone density scan:  [facility] Address: [address], [place], [place] Phone: [phone]  [facility] [address], [address] [place], [place] [address] Phone: [phone]     Health Maintenance  Topic Date Due   Medicare Annual Wellness Visit  Never done   Zoster (Shingles) Vaccine (1 of 2) Never done   DTaP/Tdap/Td vaccine (2 - Tdap) 01/30/2004   Breast Cancer Screening  07/18/2021   DEXA scan (bone density measurement)  Never done   COVID-19 Vaccine (5 - 2025-26 season) 04/08/2024*   Colon Cancer Screening  06/30/2030   Pneumococcal Vaccine for age over 45  Completed   Flu Shot  Completed   Hepatitis C Screening  Completed   Meningitis B Vaccine  Aged Out  *Topic was postponed. The date shown is not the original due date.       03/30/2024    3:55 PM  Advanced Directives  Does Patient Have a Medical Advance Directive? No  Would patient like information on  creating a medical advance directive? Yes (MAU/Ambulatory/Procedural Areas - Information given)    Vision: Annual vision screenings are recommended for early detection of glaucoma, cataracts, and diabetic retinopathy. These exams can also reveal signs of chronic conditions such as diabetes and high blood pressure.  Dental: Annual dental screenings help detect early signs of oral cancer, gum disease, and other conditions linked to overall health, including heart disease and diabetes.  Please see the attached documents for additional preventive care recommendations.   Fall Prevention in the Home, Adult Falls can cause injuries and affect people of all ages. There are many simple things that you can do to make your home safe and to help prevent falls. If you need it, ask for help making these changes. What actions can I take to prevent falls? General information Use good lighting in all rooms. Make sure to: Replace any light bulbs that burn out. Turn on lights if it is dark and use night-lights. Keep items that you use often in easy-to-reach places. Lower the shelves around your home if needed. Move furniture so that there are clear paths around it. Do not keep throw rugs or other things on the floor that can make you trip. If any of your floors are uneven, fix them. Add color or contrast paint or tape to clearly mark and help you see: Grab bars or handrails. First and last steps of staircases. Where the edge of each step is. If you use a ladder or stepladder: Make sure that  it is fully opened. Do not climb a closed ladder. Make sure the sides of the ladder are locked in place. Have someone hold the ladder while you use it. Know where your pets are as you move through your home. What can I do in the bathroom?     Keep the floor dry. Clean up any water that is on the floor right away. Remove soap buildup in the bathtub or shower. Buildup makes bathtubs and showers slippery. Use  non-skid mats or decals on the floor of the bathtub or shower. Attach bath mats securely with double-sided, non-slip rug tape. If you need to sit down while you are in the shower, use a non-slip stool. Install grab bars by the toilet and in the bathtub and shower. Do not use towel bars as grab bars. What can I do in the bedroom? Make sure that you have a light by your bed that is easy to reach. Do not use any sheets or blankets on your bed that hang to the floor. Have a firm bench or chair with side arms that you can use for support when you get dressed. What can I do in the kitchen? Clean up any spills right away. If you need to reach something above you, use a sturdy step stool that has a grab bar. Keep electrical cables out of the way. Do not use floor polish or wax that makes floors slippery. What can I do with my stairs? Do not leave anything on the stairs. Make sure that you have a light switch at the top and the bottom of the stairs. Have them installed if you do not have them. Make sure that there are handrails on both sides of the stairs. Fix handrails that are broken or loose. Make sure that handrails are as long as the staircases. Install non-slip stair treads on all stairs in your home if they do not have carpet. Avoid having throw rugs at the top or bottom of stairs, or secure the rugs with carpet tape to prevent them from moving. Choose a carpet design that does not hide the edge of steps on the stairs. Make sure that carpet is firmly attached to the stairs. Fix any carpet that is loose or worn. What can I do on the outside of my home? Use bright outdoor lighting. Repair the edges of walkways and driveways and fix any cracks. Clear paths of anything that can make you trip, such as tools or rocks. Add color or contrast paint or tape to clearly mark and help you see high doorway thresholds. Trim any bushes or trees on the main path into your home. Check that handrails are  securely fastened and in good repair. Both sides of all steps should have handrails. Install guardrails along the edges of any raised decks or porches. Have leaves, snow, and ice cleared regularly. Use sand, salt, or ice melt on walkways during winter months if you live where there is ice and snow. In the garage, clean up any spills right away, including grease or oil spills. What other actions can I take? Review your medicines with your health care provider. Some medicines can make you confused or feel dizzy. This can increase your chance of falling. Wear closed-toe shoes that fit well and support your feet. Wear shoes that have rubber soles and low heels. Use a cane, walker, scooter, or crutches that help you move around if needed. Talk with your provider about other ways that you can decrease your  risk of falls. This may include seeing a physical therapist to learn to do exercises to improve movement and strength. Where to find more information Centers for Disease Control and Prevention, [employer]: tonerpromos.no [employer] on Aging: baseringtones.pl National Institute on Aging: baseringtones.pl Contact a health care provider if: You are afraid of falling at home. You feel weak, drowsy, or dizzy at home. You fall at home. Get help right away if you: Lose consciousness or have trouble moving after a fall. Have a fall that causes a head injury. These symptoms may be an emergency. Get help right away. Call 911. Do not wait to see if the symptoms will go away. Do not drive yourself to the hospital. This information is not intended to replace advice given to you by your health care provider. Make sure you discuss any questions you have with your health care provider. Document Revised: 01/13/2022 Document Reviewed: 01/13/2022 Elsevier Patient Education  2024 [employer].

## 2024-03-30 NOTE — Progress Notes (Signed)
 Visit Complete: Virtual I connected with this patient by a audio enabled telemedicine application and verified that I am speaking with the correct person using two identifiers.  Patient Location: Home Provider Location: Home Office   I discussed the limitations of evaluation and management by telemedicine. The patient expressed understanding and agreed to proceed.  Persons Participating in Visit: Patient  Subjective:   Jenny Lee is a 68 y.o. female who presents for a Medicare Annual Wellness Visit.  Allergies (verified) No known allergies   History: Past Medical History:  Diagnosis Date   Acute myocardial infarction (HCC) 12/04/2015   Anxiety    a.) on BZO (clonazepam ) PRN   Atypical angina    CAD (coronary artery disease) 11/2000   a.) s/p NSTEMI 11/2000 --> LHC 12/22/2000 --> 100% mPDA -->  no PCI placed; SCAD of dLAD; b.) LHC 04/26/2003: normal coronaries; c.) LHC 11/26/2015: EF 35%, anteroapical aneurysm, normal coronary arteries; d.) cCTA 11/12/2022: Ca2+ score 0; e. 08/2023 reportedly nl stress test.   Chronic pain syndrome    Diastolic dysfunction    a.) TTE 09/12/2022: EF 60-65%, AoV sclerosis, mild MR/AR, G1DD; b. 09/2023 Echo: EF of 60 to 65%, grade 1 diastolic dysfunction, apical dyskinesis, normal RV function, mild AI and aortic sclerosis with raphe between the left and right cusp of the aortic valve making it functionally bicuspid/fusion.   Diverticulosis    Dyspnea    Female genuine stress incontinence 08/15/2014   GERD (gastroesophageal reflux disease)    H. pylori infection    History of 2019 novel coronavirus disease (COVID-19) 05/21/2020   HLD (hyperlipidemia)    HTN (hypertension)    Insomnia    a.) takes melatonin   Kidney cysts    Mastalgia 10/06/2012   NSTEMI (non-ST elevated myocardial infarction) (HCC) 11/2000   a.) LHC 12/22/2000 --> 100% mPDA --> no PCI (stents) placed.  Procedure complicated by (+) SCAD of dLAD with resulting ST elevation  --> IABP placed and patient transferred to [facility]   Obstructive sleep apnea on CPAP    PAF (paroxysmal atrial fibrillation) (HCC)    a. 03/2023 noted on EP study ([facility])-->s/p ILR 06/2023.   Paroxysmal SVT (supraventricular tachycardia)    a. on sotalol  - followed by [facility] EP; b. 03/2023 - not induced on EP study.   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 02/2017 s/p PTA of RRA.   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   Syncope 11/26/2015   Takotsubo cardiomyopathy    a.) LHC 11/26/2015: EF 35%; b.) TTE 09/12/2022: EF 60-65%; c. EF of 60 to 65%, grade 1 diastolic dysfunction, apical dyskinesis, normal RV function.   Vitamin D  deficiency    Past Surgical History:  Procedure Laterality Date   ABDOMINAL HYSTERECTOMY  05/26/2002   CARDIAC CATHETERIZATION N/A 11/26/2015   Procedure: Left Heart Cath and Coronary Angiography;  Surgeon: Denyse DELENA Bathe, MD;  Location: [facility] INVASIVE CV LAB;  Service: Cardiovascular;  Laterality: N/A;   CERVICAL SPINE SURGERY  05/26/1996   fusion   CLAVICLE SURGERY Right    shaved bone   COLONOSCOPY WITH PROPOFOL  N/A 12/23/2016   Procedure: COLONOSCOPY WITH PROPOFOL ;  Surgeon: Therisa Bi, MD;  Location: [facility] ENDOSCOPY;  Service: Endoscopy;  Laterality:  N/A;   COLONOSCOPY WITH PROPOFOL  N/A 06/26/2017   Procedure: COLONOSCOPY WITH PROPOFOL ;  Surgeon: Janalyn Keene NOVAK, MD;  Location: [facility] ENDOSCOPY;  Service: Gastroenterology;  Laterality: N/A;   COLONOSCOPY WITH PROPOFOL  N/A 07/01/2023   Procedure: COLONOSCOPY WITH PROPOFOL ;  Surgeon: Therisa Bi, MD;  Location: [facility] ENDOSCOPY;  Service: Gastroenterology;  Laterality: N/A;   CORONARY BALLOON ANGIOPLASTY  11/2000   ESOPHAGOGASTRODUODENOSCOPY (EGD) WITH PROPOFOL  N/A  12/23/2016   Procedure: ESOPHAGOGASTRODUODENOSCOPY (EGD) WITH PROPOFOL ;  Surgeon: Therisa Bi, MD;  Location: [facility] ENDOSCOPY;  Service: Endoscopy;  Laterality: N/A;   ESOPHAGOGASTRODUODENOSCOPY (EGD) WITH PROPOFOL  N/A 03/31/2017   Procedure: ESOPHAGOGASTRODUODENOSCOPY (EGD) WITH PROPOFOL  WITH GASTRIC MAPPING;  Surgeon: Therisa Bi, MD;  Location: [facility] ENDOSCOPY;  Service: Gastroenterology;  Laterality: N/A;   ESOPHAGOGASTRODUODENOSCOPY (EGD) WITH PROPOFOL  N/A 12/21/2017   Procedure: ESOPHAGOGASTRODUODENOSCOPY (EGD) WITH PROPOFOL ;  Surgeon: Therisa Bi, MD;  Location: [facility] ENDOSCOPY;  Service: Gastroenterology;  Laterality: N/A;   LEFT HEART CATH AND CORONARY ANGIOGRAPHY Left 04/26/2003   Procedure: LEFT HEART CATH AND CORONARY ANGIOGRAPHY; Location: [facility]; Surgeon: Margie Lovelace, MD   POLYPECTOMY  07/01/2023   Procedure: POLYPECTOMY;  Surgeon: Therisa Bi, MD;  Location: [facility] ENDOSCOPY;  Service: Gastroenterology;;   RENAL ANGIOGRAPHY Bilateral 03/02/2017   Procedure: RENAL ANGIOGRAPHY;  Surgeon: Marea Selinda RAMAN, MD;  Location: [facility] INVASIVE CV LAB;  Service: Cardiovascular;  Laterality: Bilateral;   VAGINAL PROLAPSE REPAIR     Family History  Problem Relation Age of Onset   Hypertension Mother    Breast cancer Sister 108   Social History   Occupational History   Occupation: retired  Tobacco Use   Smoking status: Never   Smokeless tobacco: Never  Vaping Use   Vaping status: Never Used  Substance and Sexual Activity   Alcohol use: Not Currently    Comment: occasional wine   Drug use: No   Sexual activity: Yes   Tobacco Counseling Counseling given: No  SDOH Screenings   Food Insecurity: No Food Insecurity (03/30/2024)  Housing: Low Risk  (03/30/2024)  Transportation Needs: No Transportation Needs (03/30/2024)  Utilities: Not At Risk (03/30/2024)  Depression (PHQ2-9): Low Risk  (03/30/2024)  Financial Resource Strain: Medium Risk (07/12/2021)   Received from [facility]  Physical  Activity: Insufficiently Active (03/30/2024)  Social Connections: Moderately Isolated (03/30/2024)  Stress: No Stress Concern Present (03/30/2024)  Tobacco Use: Low Risk  (03/30/2024)  Recent Concern: Tobacco Use - Medium Risk (03/21/2024)   Received from [facility]  Health Literacy: Adequate Health Literacy (03/30/2024)   Depression Screen    03/30/2024    4:09 PM 03/23/2024    9:42 AM 01/26/2024    9:13 AM 11/19/2017    9:58 AM  PHQ 2/9 Scores  PHQ - 2 Score 0 1 0 0  PHQ- 9 Score 3 4       Goals Addressed               This Visit's Progress     continue to exercise and maintain weight (pt-stated)         Visit info / Clinical Intake: Medicare Wellness Visit Type:: Initial Annual Wellness Visit Medicare Wellness Visit Mode:: Telephone If telephone:: video declined If telephone or video:: pt reported vitals Interpreter Needed?: No Pre-visit prep was completed: yes AWV questionnaire completed by patient prior to visit?: no Living arrangements:: lives with spouse/significant other Patient's Overall Health Status Rating: good Typical amount of pain: (!) a lot Does pain affect daily life?: no Are you currently prescribed  opioids?: no  Dietary Habits and Nutritional Risks How many meals a day?: 2 Eats fruit and vegetables daily?: yes Most meals are obtained by: preparing own meals In the last 2 weeks, have you had any of the following?: (!) nausea, vomiting, diarrhea (Nausea) Diabetic:: no  Functional Status Activities of Daily Living (to include ambulation/medication): Independent Ambulation: Independent with device- listed below Home Assistive Devices/Equipment: CPAP; Eyeglasses Medication Administration: Independent Home Management: Independent Manage your own finances?: (!) no (husband manages finances) Primary transportation is: family/friends Concerns about vision?: no *vision screening is required for WTM* Concerns about hearing?: no  Fall Screening Falls  in the past year?: 0 Number of falls in past year: 0 Was there an injury with Fall?: 0 Fall Risk Category Calculator: 0 Patient Fall Risk Level: Low Fall Risk  Fall Risk Patient at Risk for Falls Due to: No Fall Risks Fall risk Follow up: Falls evaluation completed  Home and Transportation Safety: All rugs have non-skid backing?: (!) no All stairs or steps have railings?: yes Grab bars in the bathtub or shower?: yes Have non-skid surface in bathtub or shower?: yes Good home lighting?: yes Regular seat belt use?: yes Hospital stays in the last year:: no  Cognitive Assessment Difficulty concentrating, remembering, or making decisions? : no Will 6CIT or Mini Cog be Completed: yes What year is it?: 0 points What month is it?: 0 points Give patient an address phrase to remember (5 components): [address] [place] About what time is it?: 0 points Count backwards from 20 to 1: 0 points Say the months of the year in reverse: 0 points Repeat the address phrase from earlier: 6 points 6 CIT Score: 6 points  Advance Directives (For Healthcare) Does Patient Have a Medical Advance Directive?: No Would patient like information on creating a medical advance directive?: Yes (MAU/Ambulatory/Procedural Areas - Information given)  Reviewed/Updated  Reviewed/Updated: All        Objective:    Today's Vitals   03/30/24 1539  Weight: 154 lb (69.9 kg)  Height: 5' 5 (1.651 m)   Body mass index is 25.63 kg/m.  Current Medications (verified) Outpatient Encounter Medications as of 03/30/2024  Medication Sig   acetaminophen  (TYLENOL ) 500 MG tablet Take 2 tablets (1,000 mg total) by mouth every 6 (six) hours as needed for mild pain.   aspirin  81 MG tablet Take 81 mg by mouth daily.   celecoxib (CELEBREX) 100 MG capsule Take 100 mg by mouth 2 (two) times daily. (Patient taking differently: Take 100 mg by mouth 2 (two) times daily. Taking PRN)   clonazePAM  (KLONOPIN ) 0.5 MG tablet Take 1  tablet (0.5 mg total) by mouth at bedtime.   diltiazem  (CARDIZEM  CD) 120 MG 24 hr capsule Take 120 mg by mouth daily.   estradiol  (ESTRACE ) 0.5 MG tablet TAKE 1 TABLET BY MOUTH EVERY DAY   furosemide  (LASIX ) 20 MG tablet Take 10 mg by mouth daily as needed.   gabapentin  (NEURONTIN ) 300 MG capsule Take by mouth. Take 1 tablet am and 3 tablets pm daily. (Patient taking differently: Take by mouth. Take 1 tablet am and 3 tablets pm daily. Taking 3 tablet at bedtime)   hydrOXYzine (VISTARIL) 25 MG capsule Take 1 capsule (25 mg total) by mouth at bedtime as needed.   isosorbide  mononitrate (IMDUR ) 30 MG 24 hr tablet Take 1 tablet (30 mg total) by mouth 2 (two) times daily.   Melatonin 10 MG TABS Take 10 mg by mouth at bedtime.  metoprolol  tartrate (LOPRESSOR ) 25 MG tablet Take 1 tablet (25 mg total) by mouth daily as needed (palpitations).   nystatin -triamcinolone  ointment (MYCOLOG) Apply 1 Application topically 2 (two) times daily. Apply for no longer than 2 weeks at a time.   ondansetron  (ZOFRAN ) 4 MG tablet Take 1 tablet (4 mg total) by mouth every 8 (eight) hours as needed for nausea.   pantoprazole  (PROTONIX ) 40 MG tablet 40 mg as needed.   ranolazine  (RANEXA ) 1000 MG SR tablet Take 1 tablet (1,000 mg total) by mouth 2 (two) times daily. (Patient taking differently: Take by mouth 2 (two) times daily. Taking 500mg  twice daily)   rosuvastatin  (CRESTOR ) 40 MG tablet TAKE 1 TABLET BY MOUTH EVERY DAY   sotalol  (BETAPACE ) 80 MG tablet Take 1 tablet by mouth daily.   terbinafine  (LAMISIL ) 250 MG tablet Take 1 tablet (250 mg total) by mouth daily.   tiZANidine  (ZANAFLEX ) 2 MG tablet Take 2 mg by mouth every 8 (eight) hours as needed.   Vitamin D , Ergocalciferol , (DRISDOL ) 1.25 MG (50000 UNIT) CAPS capsule TAKE ONE CAPSULE BY MOUTH EVERY WEEK   cetirizine (ZYRTEC) 10 MG tablet Take 10 mg by mouth daily. (Patient not taking: Reported on 03/30/2024)   DULoxetine (CYMBALTA) 30 MG capsule Take 30 mg by mouth  daily. (Patient not taking: Reported on 03/30/2024)   No facility-administered encounter medications on file as of 03/30/2024.   Hearing/Vision screen Hearing Screening - Comments:: Denies hearing loss  Vision Screening - Comments:: UTD @ Dr. Lynwood Hasting @ [facility] [place] Immunizations and Health Maintenance Health Maintenance  Topic Date Due   Zoster Vaccines- Shingrix (1 of 2) Never done   DTaP/Tdap/Td (2 - Tdap) 01/30/2004   Mammogram  07/18/2021   DEXA SCAN  Never done   COVID-19 Vaccine (5 - 2025-26 season) 04/08/2024 (Originally 01/25/2024)   Medicare Annual Wellness (AWV)  03/30/2025   Colonoscopy  06/30/2030   Pneumococcal Vaccine: 50+ Years  Completed   Influenza Vaccine  Completed   Hepatitis C Screening  Completed   Meningococcal B Vaccine  Aged Out        Assessment/Plan:  This is a routine wellness examination for [employer].  Patient Care Team: Sharma Coyer, MD as PCP - General (Family Medicine) Darron Deatrice LABOR, MD as PCP - Cardiology (Cardiology) Dennise Capri, MD as Consulting Physician (Nephrology) Hasting Lynwood, OD (Optometry) Bari Bottcher, MD (Pain Medicine) Syed, Faisal Fiazuddin, MD as Referring Physician (Cardiology) Silva Juliene SAUNDERS, DPM as Consulting Physician (Podiatry)  I have personally reviewed and noted the following in the patient's chart:   Medical and social history Use of alcohol, tobacco or illicit drugs  Current medications and supplements including opioid prescriptions. Functional ability and status Nutritional status Physical activity Advanced directives List of other physicians Hospitalizations, surgeries, and ER visits in previous 12 months Vitals Screenings to include cognitive, depression, and falls Referrals and appointments  No orders of the defined types were placed in this encounter.  In addition, I have reviewed and discussed with patient certain preventive protocols, quality metrics, and best practice  recommendations. A written personalized care plan for preventive services as well as general preventive health recommendations were provided to patient.   Vina Ned, CMA   03/30/2024   Return in 1 year (on 03/30/2025).  After Visit Summary: (Mail) Due to this being a telephonic visit, the after visit summary with patients personalized plan was offered to patient via mail   Nurse Notes:  6 CIT Score - 6 Gave  ph# to schedule MMG and DEXA scan (orders previously placed) Needs Tdap and Shingles vaccines (pharmacy) Declined Covid vaccine at this time

## 2024-04-02 ENCOUNTER — Other Ambulatory Visit: Payer: Self-pay | Admitting: Podiatry

## 2024-04-08 ENCOUNTER — Ambulatory Visit: Payer: Self-pay

## 2024-04-08 NOTE — Telephone Encounter (Signed)
 FYI Only or Action Required?: FYI only for provider: appointment scheduled on 04/11/24.  Patient was last seen in primary care on 03/23/2024 by Sharma Coyer, MD.  Called Nurse Triage reporting Headache.  Symptoms began several weeks ago.  Interventions attempted: OTC medications: acetaminophen .  Symptoms are: unchanged.  Triage Disposition: See Physician Within 24 Hours  Patient/caregiver understands and will follow disposition?: Yes          Copied from CRM #[id number]. Topic: Clinical - Red Word Triage >> Apr 08, 2024 12:49 PM Larissa S wrote: [facility] that prompted transfer to Nurse Triage: severe headache x 2 weeks Reason for Disposition  [1] MODERATE headache (e.g., interferes with normal activities) AND [2] present > 24 hours AND [3] unexplained  (Exceptions: Pain medicines not tried, typical migraine, or headache part of viral illness.)    Strongly advised UC/ED for worsening sx over the weekend.  Answer Assessment - Initial Assessment Questions 1. LOCATION: Where does it hurt?      Back and sides, all over 2. ONSET: When did the headache start? (e.g., minutes, hours, days)      2 weeks 3. PATTERN: Does the pain come and go, or has it been constant since it started?     Comes and goes, especially at night 4. SEVERITY: How bad is the pain? and What does it keep you from doing?  (e.g., Scale 1-10; mild, moderate, or severe)     10/10 Currently 5-6/10 tylenol  5. RECURRENT SYMPTOM: Have you ever had headaches before? If Yes, ask: When was the last time? and What happened that time?      First time 6. CAUSE: What do you think is causing the headache?     unknown 7. MIGRAINE: Have you been diagnosed with migraine headaches? If Yes, ask: Is this headache similar?      N/a 8. HEAD INJURY: Has there been any recent injury to your head?      denies 9. OTHER SYMPTOMS: Do you have any other symptoms? (e.g., fever, stiff neck, eye pain,  sore throat, cold symptoms)     Denies. Occasional eye pain 10. PREGNANCY: Is there any chance you are pregnant? When was your last menstrual period?       N/a  Protocols used: Headache-A-AH

## 2024-04-09 ENCOUNTER — Other Ambulatory Visit: Payer: Self-pay

## 2024-04-09 ENCOUNTER — Emergency Department

## 2024-04-09 ENCOUNTER — Encounter: Payer: Self-pay | Admitting: Intensive Care

## 2024-04-09 ENCOUNTER — Emergency Department
Admission: EM | Admit: 2024-04-09 | Discharge: 2024-04-09 | Disposition: A | Discharge status: Home / Self Care | Attending: Emergency Medicine | Admitting: Emergency Medicine

## 2024-04-09 DIAGNOSIS — E871 Hypo-osmolality and hyponatremia: Secondary | ICD-10-CM | POA: Diagnosis not present

## 2024-04-09 DIAGNOSIS — R519 Headache, unspecified: Secondary | ICD-10-CM | POA: Diagnosis present

## 2024-04-09 DIAGNOSIS — I10 Essential (primary) hypertension: Secondary | ICD-10-CM | POA: Insufficient documentation

## 2024-04-09 DIAGNOSIS — I2510 Atherosclerotic heart disease of native coronary artery without angina pectoris: Secondary | ICD-10-CM | POA: Diagnosis not present

## 2024-04-09 DIAGNOSIS — G43909 Migraine, unspecified, not intractable, without status migrainosus: Secondary | ICD-10-CM | POA: Diagnosis not present

## 2024-04-09 LAB — CBC WITH DIFFERENTIAL/PLATELET
Abs Immature Granulocytes: 0.01 K/uL (ref 0.00–0.07)
Basophils Absolute: 0.1 K/uL (ref 0.0–0.1)
Basophils Relative: 1 %
Eosinophils Absolute: 0.1 K/uL (ref 0.0–0.5)
Eosinophils Relative: 2 %
HCT: 36.1 % (ref 36.0–46.0)
Hemoglobin: 11.8 g/dL — ABNORMAL LOW (ref 12.0–15.0)
Immature Granulocytes: 0 %
Lymphocytes Relative: 49 %
Lymphs Abs: 4.1 K/uL — ABNORMAL HIGH (ref 0.7–4.0)
MCH: 27.8 pg (ref 26.0–34.0)
MCHC: 32.7 g/dL (ref 30.0–36.0)
MCV: 85.1 fL (ref 80.0–100.0)
Monocytes Absolute: 0.5 K/uL (ref 0.1–1.0)
Monocytes Relative: 6 %
Neutro Abs: 3.4 K/uL (ref 1.7–7.7)
Neutrophils Relative %: 42 %
Platelets: 274 K/uL (ref 150–400)
RBC: 4.24 MIL/uL (ref 3.87–5.11)
RDW: 14.2 % (ref 11.5–15.5)
WBC: 8.2 K/uL (ref 4.0–10.5)
nRBC: 0 % (ref 0.0–0.2)

## 2024-04-09 LAB — COMPREHENSIVE METABOLIC PANEL WITH GFR
ALT: 11 U/L (ref 0–44)
AST: 24 U/L (ref 15–41)
Albumin: 4.4 g/dL (ref 3.5–5.0)
Alkaline Phosphatase: 90 U/L (ref 38–126)
Anion gap: 7 (ref 5–15)
BUN: 19 mg/dL (ref 8–23)
CO2: 25 mmol/L (ref 22–32)
Calcium: 8.9 mg/dL (ref 8.9–10.3)
Chloride: 102 mmol/L (ref 98–111)
Creatinine, Ser: 0.96 mg/dL (ref 0.44–1.00)
GFR, Estimated: >60 mL/min (ref >60)
Glucose, Bld: 115 mg/dL — ABNORMAL HIGH (ref 70–99)
Potassium: 4.9 mmol/L (ref 3.5–5.1)
Sodium: 134 mmol/L — ABNORMAL LOW (ref 135–145)
Total Bilirubin: <0.2 mg/dL (ref 0.0–1.2)
Total Protein: 7.1 g/dL (ref 6.5–8.1)

## 2024-04-09 MED ORDER — METOCLOPRAMIDE HCL 10 MG PO TABS: 10.0000 mg | ORAL_TABLET | Freq: Three times a day (TID) | ORAL | 0 refills | Status: AC | PRN

## 2024-04-09 MED ORDER — METOCLOPRAMIDE HCL 5 MG/ML IJ SOLN
10.0000 mg | Freq: Once | INTRAMUSCULAR | Status: AC
  Administered 2024-04-09: 10 mg via INTRAVENOUS
  Filled 2024-04-09: qty 2

## 2024-04-09 MED ORDER — DIPHENHYDRAMINE HCL 50 MG/ML IJ SOLN
25.0000 mg | Freq: Once | INTRAMUSCULAR | Status: AC
  Administered 2024-04-09: 25 mg via INTRAVENOUS
  Filled 2024-04-09: qty 1

## 2024-04-09 NOTE — ED Triage Notes (Signed)
 Patient c/o headache and nausea X3 weeks. Reports no relief with Aleve

## 2024-04-09 NOTE — ED Notes (Signed)
 Patient transported to CT

## 2024-04-09 NOTE — Discharge Instructions (Signed)
 You may continue to take Aleve as well as the Reglan that was prescribed today.  Follow-up with your primary care provider.  Return to the ER immediately for new, worsening, or persistent severe headache, dizziness, severely elevated blood pressure, or any other new or worsening symptoms that concern you.

## 2024-04-09 NOTE — ED Provider Notes (Signed)
 [facility] Provider Note    Event Date/Time   First MD Initiated Contact with Patient 04/09/24 1845     (approximate)   History   Headache and Nausea   HPI  Jenny CERVENKA is a 68 y.o. female with a history of CAD, diastolic dysfunction, GERD, hypertension, hyperlipidemia who presents with headaches for the last 3 weeks, intermittent, bilateral frontal, radiating around to the back of her head, associated with nausea, no associated vision changes.  The patient denies any chest pain or difficulty breathing.  She states she is compliant with her blood pressure medications.  Prior to these last several weeks she has not had headaches like this before.  She took Aleve at home with no relief.  I reviewed the past medical records.  The patient's most recent outpatient encounter was on 11/5 with family medicine for a telemedicine visit for a Medicare annual wellness visit.   Physical Exam   Triage Vital Signs: ED Triage Vitals [04/09/24 1841]  Encounter Vitals Group     BP (!) 201/92     Girls Systolic BP Percentile      Girls Diastolic BP Percentile      Boys Systolic BP Percentile      Boys Diastolic BP Percentile      Pulse Rate 88     Resp 18     Temp 97.8 F (36.6 C)     Temp Source Oral     SpO2 100 %     Weight 154 lb (69.9 kg)     Height 5' 5 (1.651 m)     Head Circumference      Peak Flow      Pain Score 8     Pain Loc      Pain Education      Exclude from Growth Chart     Most recent vital signs: Vitals:   04/09/24 2030 04/09/24 2100  BP: (!) 114/55 (!) 102/57  Pulse: 60 61  Resp:    Temp:    SpO2: 100% 100%     General:  Alert, relatively well-appearing, no distress.  CV:  Good peripheral perfusion.  Resp:  Normal effort.  Abd:  No distention.  Other:  EOMI.  PERRLA.  No photophobia.  No facial droop.  Motor and sensory intact in all extremities.  No ataxia on finger-to-nose.  Normal speech.  Neck supple with full ROM.  No  meningeal signs.   ED Results / Procedures / Treatments   Labs (all labs ordered are listed, but only abnormal results are displayed) Labs Reviewed  CBC WITH DIFFERENTIAL/PLATELET - Abnormal; Notable for the following components:      Result Value   Hemoglobin 11.8 (*)    Lymphs Abs 4.1 (*)    All other components within normal limits  COMPREHENSIVE METABOLIC PANEL WITH GFR - Abnormal; Notable for the following components:   Sodium 134 (*)    Glucose, Bld 115 (*)    All other components within normal limits     EKG     RADIOLOGY  CT head: I independently viewed and interpreted the images; there is no ICH.  Radiology report indicates no acute abnormality.  PROCEDURES:  Critical Care performed: No  Procedures   MEDICATIONS ORDERED IN ED: Medications  metoCLOPramide (REGLAN) injection 10 mg (10 mg Intravenous Given 04/09/24 1931)  diphenhydrAMINE (BENADRYL) injection 25 mg (25 mg Intravenous Given 04/09/24 1931)     IMPRESSION / MDM / ASSESSMENT AND PLAN /  ED COURSE  I reviewed the triage vital signs and the nursing notes.  68 year old female with PMH as noted above presents with a subacute recurrent severe headache.  On exam she is Stampley hypertensive although she states she is compliant with her antihypertensive medications.  Thorough neurologic exam is nonfocal.  There are no meningeal signs.  Differential diagnosis includes, but is not limited to, tension headache, migraine, less likely ICH, hypertensive emergency.  CT head was obtained and is negative for acute findings.  Given elevated blood pressure we have obtained BMP and CBC which are also unremarkable.  We will give a migraine cocktail, monitor the blood pressure, and give antihypertensives if needed.  Patient's presentation is most consistent with acute presentation with potential threat to life or bodily function.  The patient is on the cardiac monitor to evaluate for evidence of arrhythmia and/or  significant heart rate changes.  ----------------------------------------- 9:11 PM on 04/09/2024 -----------------------------------------  The blood pressure improved even prior to the migraine cocktail being given.  Her blood pressure is now normal.  On reassessment, the headache is almost completely resolved.  She is feeling much better.  She feels comfortable and would like to go home.  Given the negative workup, I feel that she is stable for discharge at this time.  I counseled her on the results of the workup and plan of care.  I gave strict return precautions and she expressed understanding.   FINAL CLINICAL IMPRESSION(S) / ED DIAGNOSES   Final diagnoses:  Migraine without status migrainosus, not intractable, unspecified migraine type     Rx / DC Orders   ED Discharge Orders          Ordered    metoCLOPramide (REGLAN) 10 MG tablet  Every 8 hours PRN        04/09/24 2111             Note:  This document was prepared using Dragon voice recognition software and may include unintentional dictation errors.    Jacolyn Pae, MD 04/09/24 2112

## 2024-04-11 ENCOUNTER — Encounter: Payer: Self-pay | Admitting: Family Medicine

## 2024-04-11 ENCOUNTER — Ambulatory Visit (INDEPENDENT_AMBULATORY_CARE_PROVIDER_SITE_OTHER): Admitting: Family Medicine

## 2024-04-11 VITALS — BP 137/68 | HR 56 | Temp 97.5°F | Ht 65.0 in | Wt 155.2 lb

## 2024-04-11 DIAGNOSIS — G44219 Episodic tension-type headache, not intractable: Secondary | ICD-10-CM

## 2024-04-11 DIAGNOSIS — F419 Anxiety disorder, unspecified: Secondary | ICD-10-CM | POA: Diagnosis not present

## 2024-04-11 DIAGNOSIS — M542 Cervicalgia: Secondary | ICD-10-CM | POA: Diagnosis not present

## 2024-04-11 MED ORDER — CLONAZEPAM 0.5 MG PO TABS: 0.5000 mg | ORAL_TABLET | Freq: Every day | ORAL | 3 refills | Status: AC

## 2024-04-11 MED ORDER — CYCLOBENZAPRINE HCL 5 MG PO TABS: 5.0000 mg | ORAL_TABLET | Freq: Three times a day (TID) | ORAL | 1 refills | Status: AC | PRN

## 2024-04-11 MED ORDER — SUMATRIPTAN SUCCINATE 50 MG PO TABS: 50.0000 mg | ORAL_TABLET | ORAL | 0 refills | Status: DC | PRN

## 2024-04-11 NOTE — Progress Notes (Signed)
 ACUTE VISIT   Patient: Jenny Lee   DOB: 11/18/1955   68 y.o. Female  MRN: [id number]   PCP: Sharma Coyer, MD  Chief Complaint  Patient presents with   Headache    Patient presents with headache several days. Patient went to ED Saturday because pain was so bad, reports now that she has right sided neck pain and some pressure    Subjective    HPI HPI     Headache    Additional comments: Patient presents with headache several days. Patient went to ED Saturday because pain was so bad, reports now that she has right sided neck pain and some pressure       Last edited by Cherry Chiquita HERO, CMA on 04/11/2024  1:15 PM.       Discussed the use of AI scribe software for clinical note transcription with the patient, who gave verbal consent to proceed.  History of Present Illness Jenny Lee is a 68 year old female who presents for follow-up after an emergency department visit for migraines.  Two days ago, she visited the emergency department for a migraine. A head CT showed no acute intracranial process, infarct, or hemorrhage. Her complete metabolic panel was unremarkable, and a CBC showed a hemoglobin of 11.8. She was treated with metoclopramide and diphenhydramine, which nearly resolved her headache and normalized her blood pressure.  Her headaches have been intermittently occurring for the last three weeks, described as bilateral frontal pain radiating to the back of her head, associated with nausea but no visual changes. During her ED visit, her blood pressure was elevated at 201/92, which normalized after treatment. Currently, she reports right-sided headache pain and pressure, which is bothersome mainly at night. Some sounds exacerbate her headache, but lying down in the dark helps alleviate it.  Current medications include diltiazem  120 mg for blood pressure, lisinopril 10 mg as needed, metoprolol  25 mg for palpitations, atorvastatin 40 mg for  hyperlipidemia, sotalol  80 mg for heart rate, and tizanidine  2 mg every eight hours as needed. She has not yet taken the metoclopramide prescribed in the ED but plans to use it if needed for nausea associated with headaches.  No vision changes, changes to hearing, or weakness.     Medications: Outpatient Medications Prior to Visit  Medication Sig   acetaminophen  (TYLENOL ) 500 MG tablet Take 2 tablets (1,000 mg total) by mouth every 6 (six) hours as needed for mild pain.   aspirin  81 MG tablet Take 81 mg by mouth daily.   celecoxib (CELEBREX) 100 MG capsule Take 100 mg by mouth 2 (two) times daily. (Patient taking differently: Take 100 mg by mouth 2 (two) times daily. Taking PRN)   cetirizine (ZYRTEC) 10 MG tablet Take 10 mg by mouth daily.   diltiazem  (CARDIZEM  CD) 120 MG 24 hr capsule Take 120 mg by mouth daily.   DULoxetine (CYMBALTA) 30 MG capsule Take 30 mg by mouth daily.   estradiol  (ESTRACE ) 0.5 MG tablet TAKE 1 TABLET BY MOUTH EVERY DAY   furosemide  (LASIX ) 20 MG tablet Take 10 mg by mouth daily as needed.   gabapentin  (NEURONTIN ) 300 MG capsule Take by mouth. Take 1 tablet am and 3 tablets pm daily. (Patient taking differently: Take by mouth. Take 1 tablet am and 3 tablets pm daily. Taking 3 tablet at bedtime)   hydrOXYzine (VISTARIL) 25 MG capsule Take 1 capsule (25 mg total) by mouth at bedtime as needed.   isosorbide  mononitrate (  IMDUR ) 30 MG 24 hr tablet Take 1 tablet (30 mg total) by mouth 2 (two) times daily.   Melatonin 10 MG TABS Take 10 mg by mouth at bedtime.    metoCLOPramide (REGLAN) 10 MG tablet Take 1 tablet (10 mg total) by mouth every 8 (eight) hours as needed (headache, nausea).   metoprolol  tartrate (LOPRESSOR ) 25 MG tablet Take 1 tablet (25 mg total) by mouth daily as needed (palpitations).   nystatin -triamcinolone  ointment (MYCOLOG) Apply 1 Application topically 2 (two) times daily. Apply for no longer than 2 weeks at a time.   ondansetron  (ZOFRAN ) 4 MG tablet  Take 1 tablet (4 mg total) by mouth every 8 (eight) hours as needed for nausea.   pantoprazole  (PROTONIX ) 40 MG tablet 40 mg as needed.   ranolazine  (RANEXA ) 1000 MG SR tablet Take 1 tablet (1,000 mg total) by mouth 2 (two) times daily. (Patient taking differently: Take by mouth 2 (two) times daily. Taking 500mg  twice daily)   rosuvastatin  (CRESTOR ) 40 MG tablet TAKE 1 TABLET BY MOUTH EVERY DAY   sotalol  (BETAPACE ) 80 MG tablet Take 1 tablet by mouth daily.   terbinafine  (LAMISIL ) 250 MG tablet Take 1 tablet (250 mg total) by mouth daily.   tiZANidine  (ZANAFLEX ) 2 MG tablet Take 2 mg by mouth every 8 (eight) hours as needed.   Vitamin D , Ergocalciferol , (DRISDOL ) 1.25 MG (50000 UNIT) CAPS capsule TAKE ONE CAPSULE BY MOUTH EVERY WEEK   [DISCONTINUED] clonazePAM  (KLONOPIN ) 0.5 MG tablet Take 1 tablet (0.5 mg total) by mouth at bedtime.   No facility-administered medications prior to visit.    Last CBC Lab Results  Component Value Date   WBC 8.2 04/09/2024   HGB 11.8 (L) 04/09/2024   HCT 36.1 04/09/2024   MCV 85.1 04/09/2024   MCH 27.8 04/09/2024   RDW 14.2 04/09/2024   PLT 274 04/09/2024   Last metabolic panel Lab Results  Component Value Date   GLUCOSE 115 (H) 04/09/2024   NA 134 (L) 04/09/2024   K 4.9 04/09/2024   CL 102 04/09/2024   CO2 25 04/09/2024   BUN 19 04/09/2024   CREATININE 0.96 04/09/2024   GFRNONAA >60 04/09/2024   CALCIUM  8.9 04/09/2024   PHOS 4.2 09/13/2022   PROT 7.1 04/09/2024   ALBUMIN 4.4 04/09/2024   LABGLOB 2.8 03/23/2024   BILITOT <0.2 04/09/2024   ALKPHOS 90 04/09/2024   AST 24 04/09/2024   ALT 11 04/09/2024   ANIONGAP 7 04/09/2024   Last lipids Lab Results  Component Value Date   CHOL 155 09/12/2022   HDL 54 09/12/2022   LDLCALC 91 09/12/2022   TRIG 52 09/12/2022   CHOLHDL 2.9 09/12/2022   Last hemoglobin A1c Lab Results  Component Value Date   HGBA1C 6.2 (H) 11/26/2015   Last thyroid  functions Lab Results  Component Value Date    TSH 0.893 09/02/2023   CT Head Wo Contrast Result Date: 04/09/2024 CLINICAL DATA:  Headache, nausea for 3 weeks EXAM: CT HEAD WITHOUT CONTRAST TECHNIQUE: Contiguous axial images were obtained from the base of the skull through the vertex without intravenous contrast. RADIATION DOSE REDUCTION: This exam was performed according to the departmental dose-optimization program which includes automated exposure control, adjustment of the mA and/or kV according to patient size and/or use of iterative reconstruction technique. COMPARISON:  12/19/2022 FINDINGS: Brain: No acute infarct or hemorrhage. Lateral ventricles and midline structures are unremarkable. No acute extra-axial fluid collections. No mass effect. Vascular: No hyperdense vessel or unexpected calcification. Skull: Normal.  Negative for fracture or focal lesion. Sinuses/Orbits: No acute finding. Other: None. IMPRESSION: 1. Stable head CT, no acute intracranial process. Electronically Signed   By: Ozell Daring M.D.   On: 04/09/2024 19:09         Objective    BP 137/68 (BP Location: Right Arm, Patient Position: Sitting, Cuff Size: Normal)   Pulse (!) 56   Temp (!) 97.5 F (36.4 C) (Oral)   Ht 5' 5 (1.651 m)   Wt 155 lb 3.2 oz (70.4 kg)   SpO2 100%   BMI 25.83 kg/m  BP Readings from Last 3 Encounters:  04/11/24 137/68  04/09/24 (!) 113/57  03/23/24 129/70   Wt Readings from Last 3 Encounters:  04/11/24 155 lb 3.2 oz (70.4 kg)  04/09/24 154 lb (69.9 kg)  03/30/24 154 lb (69.9 kg)      Physical Exam   Physical Exam VITALS: BP- 137/68 HEENT: Tympanic membranes normal. NEUROLOGICAL: Pupils equal, round, and reactive to light. Extraocular movements intact. Cranial nerves V and VII intact. Upper and lower extremity strength 5/5.   No results found for any visits on 04/11/24.  Assessment & Plan     Assessment and Plan Assessment & Plan Migraine and tension-type headache with cervicalgia Acute onset  Intermittent bilateral  frontal headaches radiating to the back of the head, associated with nausea and no visual changes. Headaches have been occurring for the last three weeks. Recent head CT showed no acute intracranial process. Blood pressure was elevated in the ED but normalized after treatment. Current medications include metoclopramide, which has been helpful. Discussed the use of Imitrex for acute headache management and Flexeril  for muscle relaxation and sleep aid. Consideration of Nurtec or [place] if current treatment is ineffective, with insurance coverage being a factor. - Continue metoclopramide 10 mg as needed for nausea and headache. - Prescribed Imitrex 50 mg for acute headache management. Take one dose at onset of headache, and a second dose two hours later if needed, not exceeding two doses in 24 hours. - Prescribed Flexeril  5 mg at bedtime for muscle relaxation and sleep aid. - Will consider Nurtec or Ubrelvy if current treatment is ineffective.  Anxiety Chronic  Stable  Discussed the use of clonazepam  at bedtime to aid with sleep and anxiety management. - Refilled clonazepam  0.5 mg at bedtime for anxiety and sleep aid.      Return in about 8 weeks (around 06/06/2024) for migraines .        Rockie Agent, MD  [facility] [phone] (phone) [phone] (fax)  [facility]

## 2024-04-11 NOTE — Telephone Encounter (Signed)
 Patient call note and symptoms reviewed. Agree with scheduled appt. Will evaluate during OV

## 2024-04-11 NOTE — Patient Instructions (Addendum)
   VISIT SUMMARY: You had a follow-up visit after your recent emergency department visit for migraines. Your head CT was clear, and your blood pressure normalized after treatment. We discussed your ongoing headaches and made some adjustments to your medications to help manage your symptoms.  YOUR PLAN: MIGRAINE AND TENSION-TYPE HEADACHE WITH CERVICALGIA: You have been experiencing intermittent headaches for the last three weeks, with pain radiating from the front to the back of your head, and associated nausea. Your head CT showed no serious issues. -Continue taking metoclopramide 10 mg as needed for nausea and headache. -Start taking Imitrex 50 mg for acute headache management. Take one dose at the onset of a headache, and a second dose two hours later if needed. Do not exceed two doses in 24 hours. -Start taking Flexeril  5 mg at bedtime for muscle relaxation and to help you sleep. -We may consider Nurtec or Ubrelvy if your current treatment is not effective.  ANXIETY: We discussed your anxiety and its impact on your sleep. -Continue taking clonazepam  0.5 mg at bedtime to help with anxiety and sleep.        To keep you healthy, please keep in mind the following health maintenance items that you are due for:   Health Maintenance Due  Topic Date Due   Zoster Vaccines- Shingrix (1 of 2) Never done   DTaP/Tdap/Td (2 - Tdap) 01/30/2004   Mammogram  07/18/2021   DEXA SCAN  Never done   COVID-19 Vaccine (5 - 2025-26 season) 01/25/2024     Best Wishes,   Dr. Lang

## 2024-04-14 ENCOUNTER — Other Ambulatory Visit: Payer: Self-pay | Admitting: Family Medicine

## 2024-04-14 DIAGNOSIS — L299 Pruritus, unspecified: Secondary | ICD-10-CM

## 2024-05-04 ENCOUNTER — Ambulatory Visit: Admitting: Podiatry

## 2024-05-04 DIAGNOSIS — B351 Tinea unguium: Secondary | ICD-10-CM

## 2024-05-07 ENCOUNTER — Other Ambulatory Visit: Payer: Self-pay | Admitting: Family Medicine

## 2024-05-07 DIAGNOSIS — G44219 Episodic tension-type headache, not intractable: Secondary | ICD-10-CM

## 2024-05-08 NOTE — Progress Notes (Signed)
°  Subjective:  Patient ID: Jenny Lee, female    DOB: 1956-04-04,  MRN: [id number]  Chief Complaint  Patient presents with   Nail Problem    Rm 6 Patient is here ot f/u on nail fungus of the left hallux. Nail is thickened and discolored. Pt states discontinuing Terbinafine  due to nausea/headaches.     Discussed the use of AI scribe software for clinical note transcription with the patient, who gave verbal consent to proceed.  History of Present Illness Jenny Lee is a 68 year old female who presents with painful bunion and nail fungus.  She returns for f/u today w/o improvement. Could not tolerate lamisil        Objective:    Physical Exam VASCULAR: DP and PT pulse palpable. Foot is warm and well-perfused. Capillary fill time is brisk. DERMATOLOGIC: Onychomycosis of the left hallux nail. Bunion on the foot is red and inflamed. Normal skin turgor, texture, and temperature.  Tinea pedis moccasin distribution bilateral NEUROLOGIC: Normal sensation to light touch and pressure. No paresthesias on examination. 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 Good range of motion of the MTP joint, tenderness of the bunion medially. Semi-reducible hammer toe contractures of the lesser digits. No ecchymosis or bruising. No gross deformity. No pain to palpation.       Results  Radiographs taken of the left foot show moderate to severe hallux valgus deformity with increased intermetatarsal angle from plain rotation and abduction of hallux   Assessment:   No diagnosis found.    Plan:  Patient was evaluated and treated and all questions answered.  Assessment and Plan Assessment & Plan L HAV and Hts Discusssed sx correction once nail healed we will f/u take new xrays and discuss sx tx options  Onychomycosis of left hallux nail Nail fungus confirmed. Oral antifungal medication was not tolerated. Do not  recommend further AF therapy d/t interactions w/ cardizem . Discussed temp removal and regrowth with topical lamisil . F/u in 1 month for this she would like to do after holidays      Return in about 4 weeks (around 06/01/2024) for left big toenail removal .

## 2024-05-09 ENCOUNTER — Ambulatory Visit: Admitting: Obstetrics

## 2024-05-09 ENCOUNTER — Encounter: Payer: Self-pay | Admitting: Obstetrics

## 2024-05-09 VITALS — BP 147/84 | HR 67 | Ht 62.6 in | Wt 154.2 lb

## 2024-05-09 DIAGNOSIS — N393 Stress incontinence (female) (male): Secondary | ICD-10-CM

## 2024-05-09 DIAGNOSIS — N94819 Vulvodynia, unspecified: Secondary | ICD-10-CM | POA: Insufficient documentation

## 2024-05-09 DIAGNOSIS — N993 Prolapse of vaginal vault after hysterectomy: Secondary | ICD-10-CM

## 2024-05-09 DIAGNOSIS — K5909 Other constipation: Secondary | ICD-10-CM

## 2024-05-09 DIAGNOSIS — N941 Unspecified dyspareunia: Secondary | ICD-10-CM | POA: Insufficient documentation

## 2024-05-09 LAB — POCT URINALYSIS DIP (CLINITEK)
Bilirubin, UA: NEGATIVE
Blood, UA: NEGATIVE
Glucose, UA: NEGATIVE mg/dL
Leukocytes, UA: NEGATIVE
Nitrite, UA: NEGATIVE
POC PROTEIN,UA: NEGATIVE
Spec Grav, UA: 1.025 (ref 1.010–1.025)
Urobilinogen, UA: 0.2 U/dL
pH, UA: 5.5 (ref 5.0–8.0)

## 2024-05-09 MED ORDER — ESTRADIOL 0.01 % VA CREA: TOPICAL_CREAM | VAGINAL | 3 refills | Status: AC

## 2024-05-09 MED ORDER — LIDOCAINE 5 % EX OINT: TOPICAL_OINTMENT | CUTANEOUS | 0 refills | Status: AC

## 2024-05-09 NOTE — Assessment & Plan Note (Addendum)
-   s/p Robot assisted sacrocolpopexy and cystoscopy for stage 2 prolapse 08/29/14 by Dr. Dorice  - symptoms recur around 1 year ago with worsening back pain and constipation - For treatment of pelvic organ prolapse, we discussed options for management including expectant management, conservative management, and surgical management, such as Kegels, a pessary, pelvic floor physical therapy, and specific surgical procedures. - referral to pelvic floor PT - will need urodynamics prior to surgical intervention - encouraged to optimize stool consistency and follow-up with GI - repeat POP-Q at follow-up

## 2024-05-09 NOTE — Assessment & Plan Note (Signed)
-   POCT UA negative, PVR 16mL - For treatment of stress urinary incontinence,  non-surgical options include expectant management, weight loss, physical therapy, as well as a pessary.  Surgical options include a midurethral sling, Burch urethropexy, and transurethral injection of a bulking agent. - minimal bother - referral for pelvic floor PT  - encouraged to optimize stool consistency - Rx to start low dose vaginal estrogen

## 2024-05-09 NOTE — Assessment & Plan Note (Addendum)
-   provided handout regarding vulvodynia, reviewed comfort measures and treatment options.  - Rx topical lidocaine  1g PRN pain up to 3x/day - consider Amitriptyline 2.5%/ gabapentin  2.5%/ baclofen 2.5% in vaginal cream. Sent to compounding pharmacy for use daily - discussed conservative management options with cold compress after intercourse  - continue lubrication use during intercourse - referral to pelvic floor PT appointment - encouraged to proceed with management of back pain due to association with pelvic pain

## 2024-05-09 NOTE — Patient Instructions (Addendum)
 You have a stage 2 (out of 4) prolapse.  We discussed the fact that it is not life threatening but there are several treatment options. For treatment of pelvic organ prolapse, we discussed options for management including expectant management, conservative management, and surgical management, such as Kegels, a pessary, pelvic floor physical therapy, and specific surgical procedures.     For treatment of stress urinary incontinence, which is leakage with physical activity/movement/strainging/coughing, we discussed expectant management versus nonsurgical options versus surgery. Nonsurgical options include weight loss, physical therapy, as well as a pessary.  Surgical options include a midurethral sling, which is a synthetic mesh sling that acts like a hammock under the urethra to prevent leakage of urine, a Burch urethropexy, and transurethral injection of a bulking agent.   Please call [phone] to schedule the earliest appointment for pelvic floor PT.  For vaginal atrophy (thinning of the vaginal tissue that can cause dryness and burning) and UTI prevention we discussed estrogen replacement in the form of vaginal cream.   Start vaginal estrogen therapy nightly for two weeks then 2 times weekly at night. This can be placed with your finger or an applicator inside the vagina and around the urethra.  Please let us  know if the prescription is too expensive and we can look for alternative options.   Is vaginal estrogen therapy safe for me? Vaginal estrogen preparations act on the vaginal skin, and only a very tiny amount is absorbed into the bloodstream (0.01%).  They work in a similar way to hand or face cream.  There is minimal absorption and they are therefore perfectly safe. If you have had breast cancer and have persistent troublesome symptoms which aren't settling with vaginal moisturisers and lubricants, local estrogen treatment may be a possibility, but consultation with your oncologist should  take place first.   Constipation: Our goal is to achieve formed bowel movements daily or every-other-day.  You may need to try different combinations of the following options to find what works best for you - everybody's body works differently so feel free to adjust the dosages as needed.  Some options to help maintain bowel health include:  Dietary changes (more leafy greens, vegetables and fruits; less processed foods) Fiber supplementation (Benefiber, FiberCon, Metamucil or Psyllium). Start slow and increase gradually to full dose. Over-the-counter agents such as: stool softeners (Docusate or Colace) and/or laxatives (Miralax , milk of magnesia)  Power Pudding is a natural mixture that may help your constipation.  To make blend 1 cup applesauce, 1 cup wheat bran, and 3/4 cup prune juice, refrigerate and then take 1 tablespoon daily with a large glass of water as needed.   Women should try to eat at least 21 to 25 grams of fiber a day, while men should aim for 30 to 38 grams a day. You can add fiber to your diet with food or a fiber supplement such as psyllium (metamucil), benefiber, or fibercon.   **please follow-up with gastroenterology for your constipation**  Here's a look at how much dietary fiber is found in some common foods. When buying packaged foods, check the Nutrition Facts label for fiber content. It can vary among brands.  Fruits Serving size Total fiber (grams)*  Raspberries 1 cup 8.0  Pear 1 medium 5.5  Apple, with skin 1 medium 4.5  Banana 1 medium 3.0  Orange 1 medium 3.0  Strawberries 1 cup 3.0   Vegetables Serving size Total fiber (grams)*  Green peas, boiled 1 cup 9.0  Broccoli, boiled  1 cup chopped 5.0  Turnip greens, boiled 1 cup 5.0  Brussels sprouts, boiled 1 cup 4.0  Potato, with skin, baked 1 medium 4.0  Sweet corn, boiled 1 cup 3.5  Cauliflower, raw 1 cup chopped 2.0  Carrot, raw 1 medium 1.5   Grains Serving size Total fiber (grams)*  Spaghetti,  whole-wheat, cooked 1 cup 6.0  Barley, pearled, cooked 1 cup 6.0  Bran flakes 3/4 cup 5.5  Quinoa, cooked 1 cup 5.0  Oat bran muffin 1 medium 5.0  Oatmeal, instant, cooked 1 cup 5.0  Popcorn, air-popped 3 cups 3.5  Brown rice, cooked 1 cup 3.5  Bread, whole-wheat 1 slice 2.0  Bread, rye 1 slice 2.0   Legumes, nuts and seeds Serving size Total fiber (grams)*  Split peas, boiled 1 cup 16.0  Lentils, boiled 1 cup 15.5  Black beans, boiled 1 cup 15.0  Baked beans, canned 1 cup 10.0  Chia seeds 1 ounce 10.0  Almonds 1 ounce (23 nuts) 3.5  Pistachios 1 ounce (49 nuts) 3.0  Sunflower kernels 1 ounce 3.0  *Rounded to nearest 0.5 gram. Source: [employer] for Standard Reference, Legacy Release    The origin of pelvic floor muscle spasm can be multifactorial, including primary, reactive to a different pain source, trauma, or even part of a centralized pain syndrome.Treatment options include pelvic floor physical therapy, local (vaginal) or oral  muscle relaxants, pelvic muscle trigger point injections or centrally acting pain medications.    For vaginal atrophy (thinning of the vaginal tissue that can cause dryness and burning) and UTI prevention we discussed estrogen replacement in the form of vaginal cream.   Start vaginal estrogen therapy nightly for two weeks then 2 times weekly at night. This can be placed with your finger or an applicator inside the vagina and around the urethra.  Please let us  know if the prescription is too expensive and we can look for alternative options.   Is vaginal estrogen therapy safe for me? Vaginal estrogen preparations act on the vaginal skin, and only a very tiny amount is absorbed into the bloodstream (0.01%).  They work in a similar way to hand or face cream.  There is minimal absorption and they are therefore perfectly safe. If you have had breast cancer and have persistent troublesome symptoms which aren't settling with vaginal  moisturisers and lubricants, local estrogen treatment may be a possibility, but consultation with your oncologist should take place first.   For vulvar pain: - start topical lidocaine  1g up to 3x/day for pain - discussed conservative management options with cold compress after intercourse  - lubrication use during intercourse  - discussed proper vulvar care, warm compression, avoid pad use, cotton only underwear and barrier ointment if needed  - encouraged Vit E suppository, moisturizer with Replens/Revaree

## 2024-05-09 NOTE — Progress Notes (Signed)
 New Patient Evaluation and Consultation  Referring Provider: Sharma Coyer, MD PCP: Sharma Coyer, MD Date of Service: 05/09/2024  SUBJECTIVE Chief Complaint: New Patient (Initial Visit) Jenny Lee is a 68 y.o. female here today for urinary incontinence and prolapse.)  History of Present Illness: Jenny Lee is a 68 y.o. Latino female seen in consultation at the request of Dr Sharma for evaluation of stress urinary incontinence and pelvic organ prolapse.    Robot assisted sacrocolpopexy (Restorelle), and cystoscopy for stage 2 prolapse 08/29/14 by Dr. Dorice without complications (no midurethral sling noted on op note). Foley was removed on POD#1. Return of vaginal bulge symptoms around 1 year ago with worsening back pain Reports frequent constipation since childhood managed by Dr. Ina, undergoing pylera switched to Vonoprazan-amoxillcin-clarith for treatment for refractory H. Pylori after failing clarithromycin  amoxicillin  Flagyl  PPI and rifabutin  and levofloxacin  Reports sensation of cuts inside for 1 year during intercourse, denies postcoital bleeding.  Takes estradiol  0.5mg  daily for 8 years Last seen by Dr. 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 The patient did not demonstrate stress incontinence with valsalva LPP of 100cm H20 with barrier reduction with the transurethral catheter removed at .   1. Uroflow Impression: Normal uroflowmetry and normal post void residual  2. Cystometrogram Summary: Normal bladder sensation. No evidence of detrusor overactivity  3. Stress Testing Impressions: No  evidence of urodynamic stress incontinence. 4. Urethral Pressure Profile Summary: Normal urethral pressure profile  5. Pressure Flow Summary: Normal micturition study.   Urinary Symptoms: Leaks urine with exercise since pregnancies Leaks 1 time(s) per months.  Pad use: 1 pads with quarter size urine per month.   Patient is not bothered by UI symptoms.  Day time voids 4.  Nocturia: 3 times per night to void. OSA on CPAP Drinks a small sip of water at bedtime due to klonopin  and gabapentin  Denies leg swelling, reports intermittent foot swelling Voiding dysfunction:  empties bladder well.  Patient does not use a catheter to empty bladder.  When urinating, patient feels she has no difficulties Drinks: 64oz water per day, 8oz coffee, sometimes 8oz tea at night  UTIs: 0 UTI's in the last year.   Denies history of blood in urine, kidney or bladder stones, pyelonephritis, bladder cancer, and kidney cancer No results found for the last 90 days.   Pelvic Organ Prolapse Symptoms:                  Patient Admits to a feeling of a tomato size bulge the vaginal area. It has been present for 1 years. Spontaneously reduces in size, increases in size during bowel movements and discomfort with intercourse Patient Admits to seeing a bulge.  This bulge is bothersome.  Bowel Symptom: Bowel movements: 1 time(s) per day Stool consistency: hard, type I stool 3-4x/week, otherwise type IV stool Straining: yes.  Splinting: no Incomplete evacuation: yes.  Patient Denies accidental bowel leakage / fecal incontinence Bowel regimen: dulcolax nightly Reports miralax  1 capful without relief in the past Last colonoscopy: Results polyp in descending colon, diverticulosis HM Colonoscopy          Upcoming     Colonoscopy (Every 7 Years) Next  due on 06/30/2030    07/01/2023  COLONOSCOPY  Only the first 1 history entries have been loaded, but more history exists.               Sexual  Function Sexually active: yes.  Sexual orientation: Straight Pain with sex: Yes, deep in the pelvis, has discomfort due to prolapse, has discomfort due to dryness Reports lubrication use with minimal relief  Pelvic Pain Admits to pelvic pain Location: inside the vagina Pain occurs:  during intercourse, intermittent during voids and bowel movements Prior pain treatment: lubrication Improved by: lubrication Worsened by: intercourse   Past Medical History:  Past Medical History:  Diagnosis Date   Acute myocardial infarction (HCC) 12/04/2015   Anxiety    a.) on BZO (clonazepam ) PRN   Atypical angina    CAD (coronary artery disease) 11/2000   a.) s/p NSTEMI 11/2000 --> LHC 12/22/2000 --> 100% mPDA -->  no PCI placed; SCAD of dLAD; b.) LHC 04/26/2003: normal coronaries; c.) LHC 11/26/2015: EF 35%, anteroapical aneurysm, normal coronary arteries; d.) cCTA 11/12/2022: Ca2+ score 0; e. 08/2023 reportedly nl stress test.   Chronic pain syndrome    Diastolic dysfunction    a.) TTE 09/12/2022: EF 60-65%, AoV sclerosis, mild MR/AR, G1DD; b. 09/2023 Echo: EF of 60 to 65%, grade 1 diastolic dysfunction, apical dyskinesis, normal RV function, mild AI and aortic sclerosis with raphe between the left and right cusp of the aortic valve making it functionally bicuspid/fusion.   Diverticulosis    Dyspnea    Female genuine stress incontinence 08/15/2014   GERD (gastroesophageal reflux disease)    H. pylori infection    History of 2019 novel coronavirus disease (COVID-19) 05/21/2020   HLD (hyperlipidemia)    HTN (hypertension)    Insomnia    a.) takes melatonin   Kidney cysts    Mastalgia 10/06/2012   NSTEMI (non-ST elevated myocardial infarction) (HCC) 11/2000   a.) LHC 12/22/2000 --> 100% mPDA --> no PCI (stents) placed.  Procedure complicated by (+) SCAD of dLAD with resulting ST elevation --> IABP placed and patient transferred to [facility]   Obstructive sleep apnea on CPAP    PAF (paroxysmal atrial  fibrillation) (HCC)    a. 03/2023 noted on EP study ([facility])-->s/p ILR 06/2023.   Paroxysmal SVT (supraventricular tachycardia)    a. on sotalol  - followed by [facility] EP; b. 03/2023 - not induced on EP study.   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 02/2017 s/p PTA of RRA.   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   Syncope 11/26/2015   Takotsubo cardiomyopathy    a.) LHC 11/26/2015: EF 35%; b.) TTE 09/12/2022: EF 60-65%; c. EF of 60 to 65%, grade 1 diastolic dysfunction, apical dyskinesis, normal RV function.   Vitamin D  deficiency      Past Surgical History:   Past Surgical History:  Procedure Laterality Date   ABDOMINAL HYSTERECTOMY  05/26/2002   CARDIAC CATHETERIZATION N/A 11/26/2015   Procedure: Left Heart Cath and Coronary Angiography;  Surgeon: Denyse DELENA Bathe, MD;  Location: [facility] INVASIVE CV LAB;  Service: Cardiovascular;  Laterality: N/A;   CERVICAL SPINE SURGERY  05/26/1996   fusion   CLAVICLE SURGERY Right    shaved bone   COLONOSCOPY  WITH PROPOFOL  N/A 12/23/2016   Procedure: COLONOSCOPY WITH PROPOFOL ;  Surgeon: Therisa Bi, MD;  Location: [facility] ENDOSCOPY;  Service: Endoscopy;  Laterality: N/A;   COLONOSCOPY WITH PROPOFOL  N/A 06/26/2017   Procedure: COLONOSCOPY WITH PROPOFOL ;  Surgeon: Janalyn Keene NOVAK, MD;  Location: [facility] ENDOSCOPY;  Service: Gastroenterology;  Laterality: N/A;   COLONOSCOPY WITH PROPOFOL  N/A 07/01/2023   Procedure: COLONOSCOPY WITH PROPOFOL ;  Surgeon: Therisa Bi, MD;  Location: [facility] ENDOSCOPY;  Service: Gastroenterology;  Laterality: N/A;   CORONARY BALLOON ANGIOPLASTY  11/2000   ESOPHAGOGASTRODUODENOSCOPY (EGD) WITH PROPOFOL  N/A 12/23/2016   Procedure: ESOPHAGOGASTRODUODENOSCOPY (EGD) WITH PROPOFOL ;  Surgeon:  Therisa Bi, MD;  Location: [facility] ENDOSCOPY;  Service: Endoscopy;  Laterality: N/A;   ESOPHAGOGASTRODUODENOSCOPY (EGD) WITH PROPOFOL  N/A 03/31/2017   Procedure: ESOPHAGOGASTRODUODENOSCOPY (EGD) WITH PROPOFOL  WITH GASTRIC MAPPING;  Surgeon: Therisa Bi, MD;  Location: [facility] ENDOSCOPY;  Service: Gastroenterology;  Laterality: N/A;   ESOPHAGOGASTRODUODENOSCOPY (EGD) WITH PROPOFOL  N/A 12/21/2017   Procedure: ESOPHAGOGASTRODUODENOSCOPY (EGD) WITH PROPOFOL ;  Surgeon: Therisa Bi, MD;  Location: [facility] ENDOSCOPY;  Service: Gastroenterology;  Laterality: N/A;   LEFT HEART CATH AND CORONARY ANGIOGRAPHY Left 04/26/2003   Procedure: LEFT HEART CATH AND CORONARY ANGIOGRAPHY; Location: [facility]; Surgeon: Margie Lovelace, MD   OOPHORECTOMY Left    POLYPECTOMY  07/01/2023   Procedure: POLYPECTOMY;  Surgeon: Therisa Bi, MD;  Location: [facility] ENDOSCOPY;  Service: Gastroenterology;;   RENAL ANGIOGRAPHY Bilateral 03/02/2017   Procedure: RENAL ANGIOGRAPHY;  Surgeon: Marea Selinda RAMAN, MD;  Location: [facility] INVASIVE CV LAB;  Service: Cardiovascular;  Laterality: Bilateral;   VAGINAL PROLAPSE REPAIR       Past OB/GYN History: OB History  Gravida Para Term Preterm AB Living  6 4 4  2 4   SAB IAB Ectopic Multiple Live Births  2    4    # Outcome Date GA Lbr Len/2nd Weight Sex Type Anes PTL Lv  6 Term     M Vag-Spont   LIV  5 Term     M Vag-Spont   LIV  4 SAB           3 SAB           2 Term     M Vag-Spont   LIV  1 Term     M Vag-Spont   LIV    Obstetric Comments  1st Menstrual Cycle:  14  1st Pregnancy: 19    Vaginal deliveries: largest infant 7lb 13oz, episiotomy repaired.  Forceps/ Vacuum deliveries: 0, Cesarean section: 0 Menopausal: Yes, at age 17, Denies vaginal bleeding since menopause Contraception: s/p menopause and laparoscopic supracervical hysterectomy and LSO for AUB, ovarian torsion by Dr. Violetta  in 2006. Last pap smear was normal in 2015 per documentation with last pap smear 2008 with NILM.  Any  history of abnormal pap smears: no. No results found for: DIAGPAP, HPVHIGH, ADEQPAP  Medications: Patient has a current medication list which includes the following prescription(s): acetaminophen , aspirin , celecoxib, cetirizine, clonazepam , cyclobenzaprine , diltiazem , estradiol , estradiol , furosemide , gabapentin , hydroxyzine , isosorbide  mononitrate, lidocaine , melatonin, metoclopramide , nystatin -triamcinolone  ointment, ondansetron , pantoprazole , ranolazine , rosuvastatin , sotalol , sumatriptan , tizanidine , and vitamin d  (ergocalciferol ).   Allergies: Patient is allergic to no known allergies.   Social History: Social History[1]  Relationship status: married Patient lives with her husband.   Patient is not employed. Regular exercise: Yes: walking History of abuse: No  Family History:   Family History  Problem Relation Age of Onset   Hypertension Mother    Breast cancer Sister 61   Bladder Cancer Neg Hx  Renal cancer Neg Hx    Uterine cancer Neg Hx      Review of Systems: Review of Systems  Constitutional:  Positive for malaise/fatigue. Negative for fever and weight loss.  Respiratory:  Negative for cough, shortness of breath and wheezing.   Cardiovascular:  Positive for palpitations. Negative for chest pain and leg swelling.  Gastrointestinal:  Positive for abdominal pain and constipation. Negative for blood in stool.  Genitourinary:  Positive for frequency (night time). Negative for dysuria, hematuria and urgency.       Vaginal bulge  Skin:  Positive for rash.  Neurological:  Positive for headaches. Negative for dizziness and weakness.  Endo/Heme/Allergies:  Bruises/bleeds easily.  Psychiatric/Behavioral:  Negative for depression. The patient is nervous/anxious.      OBJECTIVE Physical Exam: Vitals:   05/09/24 1040  BP: (!) 147/84  Pulse: 67  Weight: 154 lb 3.2 oz (69.9 kg)  Height: 5' 2.6 (1.59 m)   Physical Exam Constitutional:      General: She is not in  acute distress.    Appearance: Normal appearance.  Genitourinary:     Bladder and urethral meatus normal.     No lesions in the vagina.     Genitourinary Comments: Increase pelvic floor muscle tone with valsalva     Right Labia: tenderness.     Right Labia: No rash, lesions, skin changes or Bartholin's cyst.    Left Labia: tenderness.     Left Labia: No lesions, skin changes, Bartholin's cyst or rash.       No vaginal discharge, erythema, tenderness, bleeding, ulceration, mesh exposure or granulation tissue.     Anterior vaginal prolapse present.    Severe vaginal atrophy present.     Right Adnexa: not tender, not full and no mass present.    Left Adnexa: not tender, not full and no mass present.    Cervix is not absent.     No cervical discharge, friability, lesion, polyp, eversion or elongation.     Uterus is absent.     Urethral meatus caruncle not present.    No urethral prolapse, tenderness, mass, hypermobility, discharge or stress urinary incontinence with cough stress test present.     Bladder is not tender, urgency on palpation not present and masses not present.      Pelvic Floor: Levator muscle strength is 4/5.    Levator ani is tender and obturator internus is tender.     No asymmetrical contractions present and no pelvic spasms present.    Symmetrical pelvic sensation, anal wink present and BC reflex present. Cardiovascular:     Rate and Rhythm: Normal rate.  Pulmonary:     Effort: Pulmonary effort is normal. No respiratory distress.  Abdominal:     General: There is no distension.     Palpations: Abdomen is soft. There is no mass.     Tenderness: There is abdominal tenderness. There is no right CVA tenderness or left CVA tenderness.     Hernia: No hernia is present.   Neurological:     Mental Status: She is alert.  Vitals reviewed. Exam conducted with a chaperone present.      POP-Q:   POP-Q  -1                                            Aa   -1  Ba  -8                                              C   2                                            Gh  4                                            Pb  9                                            tvl   -2                                            Ap  -2                                            Bp  -9                                              D    Post-Void Residual (PVR) by Bladder Scan: In order to evaluate bladder emptying, we discussed obtaining a postvoid residual and patient agreed to this procedure.  Procedure: The ultrasound unit was placed on the patient's abdomen in the suprapubic region after the patient had voided.    Post Void Residual - 05/09/24 1052       Post Void Residual   Post Void Residual 16 mL           Laboratory Results: Lab Results  Component Value Date   COLORU yellow 05/09/2024   CLARITYU clear 05/09/2024   GLUCOSEUR negative 05/09/2024   BILIRUBINUR negative 05/09/2024   SPECGRAV 1.025 05/09/2024   RBCUR negative 05/09/2024   PHUR 5.5 05/09/2024   PROTEINUR NEGATIVE 12/19/2022   UROBILINOGEN 0.2 05/09/2024   LEUKOCYTESUR Negative 05/09/2024    Lab Results  Component Value Date   CREATININE 0.96 04/09/2024   CREATININE 1.02 (H) 03/23/2024   CREATININE 1.01 (H) 01/14/2024    Lab Results  Component Value Date   HGBA1C 6.2 (H) 11/26/2015    Lab Results  Component Value Date   HGB 11.8 (L) 04/09/2024     ASSESSMENT AND PLAN Ms. Fofana is a 68 y.o. with:  1. Prolapse of vaginal vault after hysterectomy   2. Female genuine stress incontinence   3. Dyspareunia in female   4. Vulvodynia   5. Other constipation     Prolapse of vaginal vault after hysterectomy Assessment & Plan: - s/p Robot assisted sacrocolpopexy and cystoscopy for stage 2 prolapse 08/29/14 by Dr. Dorice  - symptoms  recur around 1 year ago with worsening back pain and constipation - For treatment of pelvic  organ prolapse, we discussed options for management including expectant management, conservative management, and surgical management, such as Kegels, a pessary, pelvic floor physical therapy, and specific surgical procedures. - referral to pelvic floor PT - will need urodynamics prior to surgical intervention - encouraged to optimize stool consistency and follow-up with GI - repeat POP-Q at follow-up  Orders: -     Estradiol ; Place 0.5g nightly for two weeks then twice a week after  Dispense: 30 g; Refill: 3 -     AMB referral to rehabilitation  Female genuine stress incontinence Assessment & Plan: - POCT UA negative, PVR 16mL - For treatment of stress urinary incontinence,  non-surgical options include expectant management, weight loss, physical therapy, as well as a pessary.  Surgical options include a midurethral sling, Burch urethropexy, and transurethral injection of a bulking agent. - minimal bother - referral for pelvic floor PT  - encouraged to optimize stool consistency - Rx to start low dose vaginal estrogen  Orders: -     POCT URINALYSIS DIP (CLINITEK) -     Estradiol ; Place 0.5g nightly for two weeks then twice a week after  Dispense: 30 g; Refill: 3 -     AMB referral to rehabilitation  Dyspareunia in female Assessment & Plan: - back pain, reproducible abdominal wall pain, pain with palpation of pubic symphysis and pelvic floor myofascial pain on exam - The origin of pelvic floor muscle spasm can be multifactorial, including primary, reactive to a different pain source, trauma, or even part of a centralized pain syndrome.Treatment options include pelvic floor physical therapy, local (vaginal) or oral  muscle relaxants, pelvic muscle trigger point injections or centrally acting pain medications.   - no mesh exposure on exam, repeat if clinical change - Rx vaginal estrogen for atrophy - Rx topical lidocaine  for vulvodynia - referral to pelvic floor PT due to pelvic floor  myofascial pain and encouraged pelvic floor relaxation exercises - encouraged topical lidocaine  use prior to intercourse and sleeping with a pillow on her sides for SI joint pain. Encouraged pelvic binder use during the day as needed for comfort  Orders: -     Estradiol ; Place 0.5g nightly for two weeks then twice a week after  Dispense: 30 g; Refill: 3 -     Lidocaine ; Use 0.5g nightly up to 3x/day as needed for pain  Dispense: 35.44 g; Refill: 0 -     AMB referral to rehabilitation  Vulvodynia Assessment & Plan: - provided handout regarding vulvodynia, reviewed comfort measures and treatment options.  - Rx topical lidocaine  1g PRN pain up to 3x/day - consider Amitriptyline 2.5%/ gabapentin  2.5%/ baclofen 2.5% in vaginal cream. Sent to compounding pharmacy for use daily - discussed conservative management options with cold compress after intercourse  - continue lubrication use during intercourse - referral to pelvic floor PT appointment - encouraged to proceed with management of back pain due to association with pelvic pain  Orders: -     AMB referral to rehabilitation  Other constipation Assessment & Plan: - symptoms since childhood - diverticulosis on colonoscopy - followed by Dr. Ina and undergoing treatment for H. pylori - For constipation, we reviewed the importance of a better bowel regimen.  We also discussed the importance of avoiding chronic straining, as it can exacerbate her pelvic floor symptoms; we discussed treating constipation and straining prior to surgery, as postoperative straining can lead to  damage to the repair and recurrence of symptoms. We discussed initiating therapy with increasing fluid intake, fiber supplementation, stool softeners, and laxatives such as miralax .  - encouraged titration of miralax  and follow-up with GI if refractory symptoms - discussed association with pelvic floor disorders, referral for pelvic floor PT - encouraged squatting position for  defecation and pelvic floor relaxation   Orders: -     AMB referral to rehabilitation  Time spent: I spent 66 minutes dedicated to the care of this patient on the date of this encounter to include pre-visit review of records, face-to-face time with the patient discussing recurrent stage II pelvic organ prolapse, constipation, dyspareunia, vulvodynia, stress urinary incontinence, and post visit documentation and ordering medication/ testing.   Lianne ONEIDA Gillis, MD         [1]  Social History Tobacco Use   Smoking status: Never   Smokeless tobacco: Never  Vaping Use   Vaping status: Never Used  Substance Use Topics   Alcohol use: Yes    Comment: occasional wine   Drug use: No

## 2024-05-09 NOTE — Assessment & Plan Note (Addendum)
-   symptoms since childhood - diverticulosis on colonoscopy - followed by Dr. Ina and undergoing treatment for H. pylori - For constipation, we reviewed the importance of a better bowel regimen.  We also discussed the importance of avoiding chronic straining, as it can exacerbate her pelvic floor symptoms; we discussed treating constipation and straining prior to surgery, as postoperative straining can lead to damage to the repair and recurrence of symptoms. We discussed initiating therapy with increasing fluid intake, fiber supplementation, stool softeners, and laxatives such as miralax .  - encouraged titration of miralax  and follow-up with GI if refractory symptoms - discussed association with pelvic floor disorders, referral for pelvic floor PT - encouraged squatting position for defecation and pelvic floor relaxation

## 2024-05-09 NOTE — Assessment & Plan Note (Addendum)
-   back pain, reproducible abdominal wall pain, pain with palpation of pubic symphysis and pelvic floor myofascial pain on exam - The origin of pelvic floor muscle spasm can be multifactorial, including primary, reactive to a different pain source, trauma, or even part of a centralized pain syndrome.Treatment options include pelvic floor physical therapy, local (vaginal) or oral  muscle relaxants, pelvic muscle trigger point injections or centrally acting pain medications.   - no mesh exposure on exam, repeat if clinical change - Rx vaginal estrogen for atrophy - Rx topical lidocaine  for vulvodynia - referral to pelvic floor PT due to pelvic floor myofascial pain and encouraged pelvic floor relaxation exercises - encouraged topical lidocaine  use prior to intercourse and sleeping with a pillow on her sides for SI joint pain. Encouraged pelvic binder use during the day as needed for comfort

## 2024-05-10 NOTE — Progress Notes (Unsigned)
 Cardiology Office Note    Date:  05/10/2024   ID:  Jenny Lee, DOB 23-Mar-1956, MRN [id number]  PCP:  Sharma Coyer, MD  Cardiologist:  Deatrice Cage, MD  Electrophysiologist:  None   Chief Complaint: ***  History of Present Illness:   Jenny Lee is a 68 y.o. female with history of spontaneous coronary artery dissection, hypertension, hyperlipidemia, diastolic dysfunction, PSVT, PAF (noted during EP study 03/2023), sleep apnea, and renal artery stenosis s/p PTA in 2018 who presents for***.    Patient suffered NSTEMI in 2002.  Echo at that time showed normal LV function with apical wall motion abnormality.  Diagnostic catheterization showed an occluded RPDA with appearance highly suggestive of scad.  She also had an abnormality in the mid to distal LAD likely representative of scad.  She required aortic balloon pump placement and was transferred to [facility] at that time and was managed medically without revascularization.  She underwent repeat catheterization by Dr. Nelida in 2017 which showed completely normal coronary arteries with recanalization of the RPDA and normal appearance of the LAD.  There was persistent wall motion abnormality in the inferior apical region.    Patient has been followed by [facility] cardiology for PSVT, which has been managed with sotalol .  In 08/2022, she presented to the emergency department with complaint of tachycardia and chest pain.  Troponin was minimally elevated.  Echo showed normal LV function without significant valvular abnormalities.  In 10/2022, she underwent coronary CTA which showed normal coronary arteries with a calcium  score of 0.  In 03/2023, she underwent electrophysiology study during which she was noted to have short episode of sustained atrial fibrillation.  No other arrhythmias were induced and therefore ablation was not performed.  She subsequently underwent implantable loop recorder placement 06/2023 to further document potential  presence of atrial fibrillation.  Patient was seen in follow-up 08/2023 at which time she had reported some dyspnea for which her PCP had recently performed a stress test which was reportedly normal.  Echo was performed and showed EF 60 to 65%, G1 DD, apical dyskinesis, normal RV function, mild AI, and aortic sclerosis with raphe between the left and right cusp of the aortic valve making it functionally bicuspid/fusion.   Patient was recently seen in the ED 01/14/2024 in the setting of an episode of chest pressure and palpitations which had occurred the night prior and resolved at time of evaluation.  This reportedly had resolved with her prescribed clonazepam .  Workup including labs, troponin, EKG, chest x-ray, and CTA PE were reassuring.  She remained asymptomatic and was discharged in stable condition with recommendation for cardiology follow-up.    Patient was most recently seen in our office for ED follow-up 01/27/2024.  She wondered if that episode could have been related to stress.  She denied any recurrence of symptoms since ED discharge.  She was given metoprolol  to tartrate 25 mg daily as needed for palpitations.  She was instructed to follow-up with [facility] EP for further evaluation/management of palpitations.    She was seen 03/21/2024 at [facility] by EP team.  Reportedly no abnormalities reported on loop recorder.  Sensitivity settings were increased to capture arrhythmias.  Patient was instructed to use magnet on loop recorder during symptomatic episodes.  She described chest pain described as a dull pressure which was tender to the touch.  She was instructed to use heat and Tylenol .  She was seen again in follow-up with [facility] EP 04/08/2024 and reported resolution of chest  pain.  She continued to have intermittent palpitations, shortness of breath, and dizziness.  Loop interrogation was without evidence of atrial fibrillation, SVT, or other arrhythmias.  No medication changes were made.  ***  Labs  independently reviewed: 03/2024-Hgb 11.8, HCT 36.1, platelets 274, sodium 134, potassium 4.9, BUN 19, creatinine 0.96, normal LFTs 09/2023-TC 261, TG 127, HDL 55, LDL 183  Objective   Past Medical History:  Diagnosis Date   Acute myocardial infarction (HCC) 12/04/2015   Anxiety    a.) on BZO (clonazepam ) PRN   Atypical angina    CAD (coronary artery disease) 11/2000   a.) s/p NSTEMI 11/2000 --> LHC 12/22/2000 --> 100% mPDA -->  no PCI placed; SCAD of dLAD; b.) LHC 04/26/2003: normal coronaries; c.) LHC 11/26/2015: EF 35%, anteroapical aneurysm, normal coronary arteries; d.) cCTA 11/12/2022: Ca2+ score 0; e. 08/2023 reportedly nl stress test.   Chronic pain syndrome    Diastolic dysfunction    a.) TTE 09/12/2022: EF 60-65%, AoV sclerosis, mild MR/AR, G1DD; b. 09/2023 Echo: EF of 60 to 65%, grade 1 diastolic dysfunction, apical dyskinesis, normal RV function, mild AI and aortic sclerosis with raphe between the left and right cusp of the aortic valve making it functionally bicuspid/fusion.   Diverticulosis    Dyspnea    Female genuine stress incontinence 08/15/2014   GERD (gastroesophageal reflux disease)    H. pylori infection    History of 2019 novel coronavirus disease (COVID-19) 05/21/2020   HLD (hyperlipidemia)    HTN (hypertension)    Insomnia    a.) takes melatonin   Kidney cysts    Mastalgia 10/06/2012   NSTEMI (non-ST elevated myocardial infarction) (HCC) 11/2000   a.) LHC 12/22/2000 --> 100% mPDA --> no PCI (stents) placed.  Procedure complicated by (+) SCAD of dLAD with resulting ST elevation --> IABP placed and patient transferred to [facility]   Obstructive sleep apnea on CPAP    PAF (paroxysmal atrial fibrillation) (HCC)    a. 03/2023 noted on EP study ([facility])-->s/p ILR 06/2023.   Paroxysmal SVT (supraventricular tachycardia)    a. on sotalol  - followed by [facility] EP; b. 03/2023 - not induced on EP study.   Prediabetes    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    Renal artery stenosis    a. 02/2017 s/p PTA of RRA.   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   Syncope 11/26/2015   Takotsubo cardiomyopathy    a.) LHC 11/26/2015: EF 35%; b.) TTE 09/12/2022: EF 60-65%; c. EF of 60 to 65%, grade 1 diastolic dysfunction, apical dyskinesis, normal RV function.   Vitamin D  deficiency     Current Medications: Active Medications[1]  Allergies:   No known allergies   Social History   Socioeconomic History   Marital status: Married    Spouse name: Jenny Lee   Number of children: 4   Years of education: Not on file   Highest education level: Not on file  Occupational History   Occupation: retired  Tobacco Use   Smoking status: Never   Smokeless tobacco: Never  Vaping Use   Vaping status: Never Used  Substance and Sexual Activity   Alcohol use: Yes    Comment: occasional wine   Drug use: No   Sexual activity: Yes  Other Topics Concern   Not on file  Social History Narrative   Lives at home with husband.   Social Drivers of Health   Tobacco Use: Low Risk (05/09/2024)   Patient History    Smoking Tobacco Use: Never    Smokeless Tobacco Use: Never    Passive Exposure: Not on file  Recent Concern: Tobacco Use - Medium Risk (04/08/2024)   [facility]   Patient History    Smoking Tobacco Use: Former    Smokeless Tobacco Use: Never    Passive Exposure: Not on file  Financial Resource Strain: Medium Risk (07/12/2021)   Received from [facility]   Overall Financial Resource Strain (CARDIA)    Difficulty of Paying Living Expenses: Somewhat hard  Food Insecurity: No Food Insecurity (03/30/2024)   Epic    Worried About Radiation Protection Practitioner of Food in the Last Year: Never true    Ran Out of Food in the Last Year: Never true  Transportation Needs: No  Transportation Needs (03/30/2024)   Epic    Lack of Transportation (Medical): No    Lack of Transportation (Non-Medical): No  Physical Activity: Insufficiently Active (03/30/2024)   Exercise Vital Sign    Days of Exercise per Week: 4 days    Minutes of Exercise per Session: 30 min  Stress: No Stress Concern Present (03/30/2024)   [employer] of Occupational Health - Occupational Stress Questionnaire    Feeling of Stress: Not at all  Social Connections: Moderately Isolated (03/30/2024)   Social Connection and Isolation Panel    Frequency of Communication with Friends and Family: More than three times a week    Frequency of Social Gatherings with Friends and Family: Twice a week    Attends Religious Services: Never    Database Administrator or Organizations: No    Attends Banker Meetings: Never    Marital Status: Married  Depression (PHQ2-9): Low Risk (03/30/2024)   Depression (PHQ2-9)    PHQ-2 Score: 3  Alcohol Screen: Not on file  Housing: Low Risk (03/30/2024)   Epic    Unable to Pay for Housing in the Last Year: No    Number of Times Moved in the Last Year: 0    Homeless in the Last Year: No  Utilities: Not At Risk (03/30/2024)   Epic    Threatened with loss of utilities: No  Health Literacy: Adequate Health Literacy (03/30/2024)   B1300 Health Literacy    Frequency of need for help with medical instructions: Never     Family History:  The patient's family history includes Breast cancer (age of onset: 30) in her sister; Hypertension in her mother. There is no history of Bladder Cancer, Renal cancer, or Uterine cancer.  ROS:   12-point review of systems is negative unless otherwise noted in the HPI.  EKGs/Other Studies Reviewed:    Studies reviewed were summarized above. The additional studies were reviewed today:  09/2023 Echo complete 1. Left ventricular ejection fraction, by estimation, is 60 to 65%. The  left ventricle has normal function. The left  ventricle demonstrates  regional wall motion abnormalities (see scoring diagram/findings for  description). Left ventricular diastolic  parameters are consistent with Grade I diastolic dysfunction (impaired  relaxation). There is dyskinesis of the left ventricular, apical segment.   2. Right ventricular systolic function is normal. The right ventricular  size is normal.   3. The mitral valve is normal in structure. No evidence of mitral valve  regurgitation.   4. Appears to be an incomplete  raphe/fusion between L-R cusps of aortic  valve(functionally bicuspid). The aortic valve is calcified. Aortic valve  regurgitation is mild. Aortic valve sclerosis/calcification is present,  without any evidence of aortic  stenosis. Aortic valve mean gradient measures 6.0 mmHg. Aortic valve Vmax  measures 1.70 m/s.   5. The inferior vena cava is normal in size with greater than 50%  respiratory variability, suggesting right atrial pressure of 3 mmHg.    07/2023 SPECT MYO (Outside records) - Reportedly normal    10/2022 Coronary CTA 1. Coronary calcium  score of 0. 2. Normal coronary origin with right dominance. 3. No evidence of CAD. 4. CAD-RADS 0. Consider non-atherosclerotic causes of chest pain.  EKG:  EKG personally reviewed by me today    PHYSICAL EXAM:    VS:  There were no vitals taken for this visit.  BMI: There is no height or weight on file to calculate BMI.  GEN: Well nourished, well developed in no acute distress NECK: No JVD; No carotid bruits CARDIAC: ***RRR, no murmurs, rubs, gallops RESPIRATORY:  Clear to auscultation without rales, wheezing or rhonchi  ABDOMEN: Soft, non-tender, non-distended EXTREMITIES:  *** No edema; No deformity  Wt Readings from Last 3 Encounters:  05/09/24 154 lb 3.2 oz (69.9 kg)  04/11/24 155 lb 3.2 oz (70.4 kg)  04/09/24 154 lb (69.9 kg)                  ASSESSMENT & PLAN:   PSVT Palpitations   Paroxysmal atrial fibrillation   Hx  SCAD   Primary hypertension   Hyperlipidemia   Right renal artery stenosis   Takotsubo cardiomyopathy    {Are you ordering a CV Procedure (e.g. stress test, cath, DCCV, TEE, etc)?   Press F2        :[id number]}   Disposition: F/u with Dr. PIERRETTE or an APP in ***.   Medication Adjustments/Labs and Tests Ordered: Current medicines are reviewed at length with the patient today.  Concerns regarding medicines are outlined above. Medication changes, Labs and Tests ordered today are summarized above and listed in the Patient Instructions accessible in Encounters.   Jenny Lesley Maffucci, PA-C 05/10/2024 2:02 PM     [facility] [place] [address] [place], [place] [address] [phone]      [1]  No outpatient medications have been marked as taking for the 05/13/24 encounter (Appointment) with Lee Lesley CROME, PA-C.

## 2024-05-13 ENCOUNTER — Ambulatory Visit: Attending: Physician Assistant | Admitting: Physician Assistant

## 2024-05-13 ENCOUNTER — Encounter: Payer: Self-pay | Admitting: Physician Assistant

## 2024-05-13 VITALS — BP 118/70 | HR 67 | Ht 65.0 in | Wt 156.0 lb

## 2024-05-13 DIAGNOSIS — Z79899 Other long term (current) drug therapy: Secondary | ICD-10-CM

## 2024-05-13 DIAGNOSIS — I10 Essential (primary) hypertension: Secondary | ICD-10-CM

## 2024-05-13 DIAGNOSIS — I471 Supraventricular tachycardia, unspecified: Secondary | ICD-10-CM

## 2024-05-13 DIAGNOSIS — I701 Atherosclerosis of renal artery: Secondary | ICD-10-CM | POA: Diagnosis not present

## 2024-05-13 DIAGNOSIS — I480 Paroxysmal atrial fibrillation: Secondary | ICD-10-CM | POA: Diagnosis not present

## 2024-05-13 DIAGNOSIS — I2542 Coronary artery dissection: Secondary | ICD-10-CM

## 2024-05-13 DIAGNOSIS — R002 Palpitations: Secondary | ICD-10-CM

## 2024-05-13 DIAGNOSIS — I5181 Takotsubo syndrome: Secondary | ICD-10-CM | POA: Diagnosis not present

## 2024-05-13 NOTE — Patient Instructions (Signed)
 Medication Instructions:  Your physician recommends that you continue on your current medications as directed. Please refer to the Current Medication list given to you today.  *If you need a refill on your cardiac medications before your next appointment, please call your pharmacy*  Lab Work: No labs ordered today  If you have labs (blood work) drawn today and your tests are completely normal, you will receive your results only by: MyChart Message (if you have MyChart) OR A paper copy in the mail If you have any lab test that is abnormal or we need to change your treatment, we will call you to review the results.  Testing/Procedures: No test ordered today   Follow-Up: At [facility], you and your health needs are our priority.  As part of our continuing mission to provide you with exceptional heart care, our providers are all part of one team.  This team includes your primary Cardiologist (physician) and Advanced Practice Providers or APPs (Physician Assistants and Nurse Practitioners) who all work together to provide you with the care you need, when you need it.  Your next appointment:   6 month(s)  Provider:   You may see Lorine Bears, MD or one of the following Advanced Practice Providers on your designated Care Team:   Nicolasa Ducking, NP Ames Dura, PA-C Eula Listen, PA-C Cadence Woden, PA-C Charlsie Quest, NP Carlos Levering, NP

## 2024-05-20 ENCOUNTER — Other Ambulatory Visit: Payer: Self-pay | Admitting: Family

## 2024-06-01 ENCOUNTER — Ambulatory Visit: Admitting: Podiatry

## 2024-06-15 ENCOUNTER — Ambulatory Visit

## 2024-06-15 ENCOUNTER — Ambulatory Visit: Admitting: Podiatry

## 2024-06-15 VITALS — Ht 65.0 in | Wt 156.0 lb

## 2024-06-15 DIAGNOSIS — M21612 Bunion of left foot: Secondary | ICD-10-CM

## 2024-06-15 DIAGNOSIS — B351 Tinea unguium: Secondary | ICD-10-CM

## 2024-06-15 MED ORDER — KETOCONAZOLE 2 % EX CREA: 1.0000 | TOPICAL_CREAM | Freq: Every day | CUTANEOUS | 0 refills | Status: AC

## 2024-06-15 NOTE — Progress Notes (Signed)
"  °  Subjective:  Patient ID: Jenny Lee, female    DOB: [birth year],  MRN: [id number]  Chief Complaint  Patient presents with   Nail Problem    RM 1 Patient is here for total nail avulsion of the left hallux and surgical consultation for left bunion.    Discussed the use of AI scribe software for clinical note transcription with the patient, who gave verbal consent to proceed.  History of Present Illness AUBREYANNA Lee is a 69 year old female who presents with painful bunion and nail fungus.  She returns for f/u today for left hallux nail removal      Objective:    Physical Exam VASCULAR: DP and PT pulse palpable. Foot is warm and well-perfused. Capillary fill time is brisk. DERMATOLOGIC: Onychomycosis of the left hallux nail. Bunion on the foot is red and inflamed. Normal skin turgor, texture, and temperature.   NEUROLOGIC: Normal sensation to light touch and pressure. No paresthesias on examination. 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 Good range of motion of the MTP joint, tenderness of the bunion medially. Semi-reducible hammer toe contractures of the lesser digits. No ecchymosis or bruising. No gross deformity. No pain to palpation.       Results  Radiographs taken of the left foot show moderate to severe hallux valgus deformity with increased intermetatarsal angle from plain rotation and abduction of hallux   Assessment:   1. Bunion, left foot   2. Onychomycosis       Plan:  Patient was evaluated and treated and all questions answered.  Assessment and Plan Assessment & Plan L HAV and Hts X-ray reviewed today, would consider MIS correction.  She has not rescheduled and she will open know if she is ready to proceed.  Presents today for removal of the nail plate for onychomycosis.  Discussed risk benefits and potential complications.  All questions addressed.  Following consent the  left hallux was anesthetized with lidocaine  and Marcaine .  Toe was prepped with Betadine and exsanguinated and a tourniquet secured around the base of the toe.  The left hallux nail plate was removed without complication.  Rx ketoconazole  sent to pharmacy to utilize postoperatively.  Post care instructions given.  Follow-up in 6 weeks for reevaluation.      Return in about 6 weeks (around 07/27/2024) for follow up after nail fungus treatment, bunion surgery planning visit.   "

## 2024-06-15 NOTE — Patient Instructions (Signed)
 Soak Instructions    THE DAY AFTER THE PROCEDURE  Place 1/4 cup of epsom salts (or betadine, or white vinegar) in a quart of warm tap water.  Submerge your foot or feet with outer bandage intact for the initial soak; this will allow the bandage to become moist and wet for easy lift off.  Once you remove your bandage, continue to soak in the solution for 20 minutes.  This soak should be done twice a day.  Next, remove your foot or feet from solution, blot dry the affected area and apply 2-3 of the Cortisporin drops if they were prescribed for you.  If you did not receive a prescription use regular Neosporin or antibiotic ointment.  Then cover with a regular Band-Aid.  Do this for at least 2 weeks.  Longer if you are still having drainage redness or irritation  IF YOUR SKIN BECOMES IRRITATED WHILE USING THESE INSTRUCTIONS, IT IS OKAY TO SWITCH TO  WHITE VINEGAR AND WATER. Or you may use antibacterial soap and water to keep the toe clean  Monitor for any signs/symptoms of infection. Call the office immediately if any occur or go directly to the emergency room. Call with any questions/concerns.

## 2024-06-16 ENCOUNTER — Telehealth: Payer: Self-pay

## 2024-06-16 ENCOUNTER — Other Ambulatory Visit (HOSPITAL_COMMUNITY): Payer: Self-pay

## 2024-06-16 ENCOUNTER — Ambulatory Visit: Admitting: Family Medicine

## 2024-06-16 ENCOUNTER — Encounter: Payer: Self-pay | Admitting: Family Medicine

## 2024-06-16 VITALS — BP 113/58 | HR 62 | Ht 65.0 in | Wt 157.4 lb

## 2024-06-16 DIAGNOSIS — R5383 Other fatigue: Secondary | ICD-10-CM

## 2024-06-16 DIAGNOSIS — R0602 Shortness of breath: Secondary | ICD-10-CM

## 2024-06-16 DIAGNOSIS — R110 Nausea: Secondary | ICD-10-CM

## 2024-06-16 MED ORDER — ONDANSETRON 4 MG PO TBDP: 4.0000 mg | ORAL_TABLET | Freq: Three times a day (TID) | ORAL | 2 refills | Status: DC | PRN

## 2024-06-16 NOTE — Telephone Encounter (Signed)
 Pharmacy Patient Advocate Encounter   Received notification from Physician's Office that prior authorization for ZOFRAN  ODT is required/requested.   Insurance verification completed.   The patient is insured through [facility] ADVANTAGE/RX ADVANCE.   Per test claim: PA required; PA submitted to above mentioned insurance via Latent Key/confirmation #/EOC [id number] Status is pending

## 2024-06-16 NOTE — Progress Notes (Signed)
 "  Established Patient Office Visit  Patient ID: Jenny Lee, female    DOB: [birth year]  Age: 69 y.o. MRN: [id number] PCP: Sharma Coyer, MD  Chief Complaint  Patient presents with   Follow-up    8 week migraine f/u. She reports she has been really fatigue lately and nauseated with SOB.    Migraine    Patient reports migraines are not as frequent but still present.     Subjective:     HPI  Discussed the use of AI scribe software for clinical note transcription with the patient, who gave verbal consent to proceed.  History of Present Illness Jenny Lee is a 69 year old female with migraine headaches who presents for a follow-up visit due to persistent symptoms.  Her migraine headaches have become less frequent but remain bothersome, with associated symptoms of nausea and fatigue worsening over the past week. She takes Imitrex  50 mg, metoclopramide  10 mg as needed for nausea, and Flexeril  5 mg at bedtime. However, she feels that the Imitrex  is not sufficiently effective and often supplements it with Tylenol .  She experiences significant nausea and fatigue, describing herself as 'very fatigued' and 'nauseated'. Metoclopramide  is used for nausea, but it is not always effective. She has not tried Zofran  before. She also reports feeling short of breath and sometimes experiences a racing heart, though she is unsure if this is related to her current symptoms.  She has a history of H. pylori infection, for which she has been on antibiotics. Despite completing a course of treatment, she remains positive for H. pylori and is awaiting further consultation with an infectious disease specialist. She is frustrated with the persistent infection and the amount of medication she is taking.  She reports gastrointestinal symptoms, including alternating constipation and diarrhea, which she describes as 'a mess'. No chest pain or significant respiratory symptoms, though she occasionally  feels her heart racing.  She recently had lab work including vitamin B12 and vitamin D  levels, as well as a complete blood count (CBC). She is also scheduled for a mammogram. 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   HTN (hypertension) 12/20/2015   HLD (hyperlipidemia) 12/20/2015   GERD (gastroesophageal reflux disease) 12/20/2015   H/O acute myocardial infarction 08/15/2014   Female genuine stress incontinence 08/15/2014   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   Past Medical History:  Diagnosis Date   Acute myocardial infarction (HCC) 12/04/2015   Anxiety    a.) on BZO (clonazepam ) PRN   Atypical angina  CAD (coronary artery disease) 11/2000   a.) s/p NSTEMI 11/2000 --> LHC 12/22/2000 --> 100% mPDA -->  no PCI placed; SCAD of dLAD; b.) LHC 04/26/2003: normal coronaries; c.) LHC 11/26/2015: EF 35%, anteroapical aneurysm, normal coronary arteries; d.) cCTA 11/12/2022: Ca2+ score 0; e. 08/2023 reportedly  nl stress test.   Chronic pain syndrome    Diastolic dysfunction    a.) TTE 09/12/2022: EF 60-65%, AoV sclerosis, mild MR/AR, G1DD; b. 09/2023 Echo: EF of 60 to 65%, grade 1 diastolic dysfunction, apical dyskinesis, normal RV function, mild AI and aortic sclerosis with raphe between the left and right cusp of the aortic valve making it functionally bicuspid/fusion.   Diverticulosis    Dyspnea    Female genuine stress incontinence 08/15/2014   GERD (gastroesophageal reflux disease)    H. pylori infection    History of 2019 novel coronavirus disease (COVID-19) 05/21/2020   HLD (hyperlipidemia)    HTN (hypertension)    Insomnia    a.) takes melatonin   Kidney cysts    Mastalgia 10/06/2012   NSTEMI (non-ST elevated myocardial infarction) (HCC) 11/2000   a.) LHC 12/22/2000 --> 100% mPDA --> no PCI (stents) placed.  Procedure complicated by (+) SCAD of dLAD with resulting ST elevation --> IABP placed and patient transferred to [facility]   Obstructive sleep apnea on CPAP    PAF (paroxysmal atrial fibrillation) (HCC)    a. 03/2023 noted on EP study ([facility])-->s/p ILR 06/2023.   Paroxysmal SVT (supraventricular tachycardia)    a. on sotalol  - followed by [facility] EP; b. 03/2023 - not induced on EP study.   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 02/2017 s/p PTA of RRA.   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   Syncope 11/26/2015   Takotsubo cardiomyopathy    a.) LHC 11/26/2015: EF 35%; b.) TTE 09/12/2022: EF 60-65%; c. EF of 60 to 65%, grade 1 diastolic dysfunction, apical dyskinesis, normal RV function.   Vitamin D  deficiency       ROS    Objective:     BP (!) 113/58 (BP Location: Left Arm, Patient Position: Sitting)   Pulse 62   Ht 5' 5 (1.651 m)    Wt 157 lb 6.4 oz (71.4 kg)   SpO2 100%   BMI 26.19 kg/m  BP Readings from Last 3 Encounters:  06/16/24 (!) 113/58  05/13/24 118/70  05/09/24 (!) 147/84   Wt Readings from Last 3 Encounters:  06/16/24 157 lb 6.4 oz (71.4 kg)  06/15/24 156 lb (70.8 kg)  05/13/24 156 lb (70.8 kg)      Physical Exam Vitals reviewed.  Constitutional:      General: She is not in acute distress.    Appearance: Normal appearance. She is not ill-appearing.  Cardiovascular:     Rate and Rhythm: Normal rate and regular rhythm.  Pulmonary:     Effort: Pulmonary effort is normal. No respiratory distress.     Breath sounds: No wheezing, rhonchi or rales.  Neurological:     Mental Status: She is alert and oriented to person, place, and time.  Psychiatric:        Mood and Affect: Mood normal. Affect is tearful.        Behavior: Behavior normal.  Results for orders placed or performed in visit on 06/16/24  VITAMIN D  25 Hydroxy (Vit-D Deficiency, Fractures)  Result Value Ref Range   Vit D, 25-Hydroxy 54.2 30.0 - 100.0 ng/mL  Vitamin B12  Result Value Ref Range   Vitamin B-12 583 232 - 1,245 pg/mL  CBC  Result Value Ref Range   WBC 8.5 3.4 - 10.8 x10E3/uL   RBC 4.33 3.77 - 5.28 x10E6/uL   Hemoglobin 12.1 11.1 - 15.9 g/dL   Hematocrit 61.7 65.9 - 46.6 %   MCV 88 79 - 97 fL   MCH 27.9 26.6 - 33.0 pg   MCHC 31.7 31.5 - 35.7 g/dL   RDW 86.7 88.2 - 84.5 %   Platelets 300 150 - 450 x10E3/uL  CMP14+EGFR  Result Value Ref Range   Glucose 95 70 - 99 mg/dL   BUN 19 8 - 27 mg/dL   Creatinine, Ser 9.00 0.57 - 1.00 mg/dL   eGFR 63 >40 fO/fpw/8.26   BUN/Creatinine Ratio 19 12 - 28   Sodium 136 134 - 144 mmol/L   Potassium 5.6 (H) 3.5 - 5.2 mmol/L   Chloride 99 96 - 106 mmol/L   CO2 22 20 - 29 mmol/L   Calcium  9.5 8.7 - 10.3 mg/dL   Total Protein 7.1 6.0 - 8.5 g/dL   Albumin 4.4 3.9 - 4.9 g/dL   Globulin, Total 2.7 1.5 - 4.5 g/dL   Bilirubin Total <9.7 0.0 - 1.2 mg/dL   Alkaline Phosphatase  93 49 - 135 IU/L   AST 19 0 - 40 IU/L   ALT 10 0 - 32 IU/L  TSH + free T4  Result Value Ref Range   TSH 1.480 0.450 - 4.500 uIU/mL   Free T4 1.13 0.82 - 1.77 ng/dL    Last CBC Lab Results  Component Value Date   WBC 8.5 06/16/2024   HGB 12.1 06/16/2024   HCT 38.2 06/16/2024   MCV 88 06/16/2024   MCH 27.9 06/16/2024   RDW 13.2 06/16/2024   PLT 300 06/16/2024   Last metabolic panel Lab Results  Component Value Date   GLUCOSE 95 06/16/2024   NA 136 06/16/2024   K 5.6 (H) 06/16/2024   CL 99 06/16/2024   CO2 22 06/16/2024   BUN 19 06/16/2024   CREATININE 0.99 06/16/2024   EGFR 63 06/16/2024   CALCIUM  9.5 06/16/2024   PHOS 4.2 09/13/2022   PROT 7.1 06/16/2024   ALBUMIN 4.4 06/16/2024   LABGLOB 2.7 06/16/2024   BILITOT <0.2 06/16/2024   ALKPHOS 93 06/16/2024   AST 19 06/16/2024   ALT 10 06/16/2024   ANIONGAP 7 04/09/2024   Last lipids Lab Results  Component Value Date   CHOL 155 09/12/2022   HDL 54 09/12/2022   LDLCALC 91 09/12/2022   TRIG 52 09/12/2022   CHOLHDL 2.9 09/12/2022   Last hemoglobin A1c Lab Results  Component Value Date   HGBA1C 6.2 (H) 11/26/2015   Last thyroid  functions Lab Results  Component Value Date   TSH 1.480 06/16/2024   FREET4 1.13 06/16/2024   Last vitamin D  Lab Results  Component Value Date   VD25OH 54.2 06/16/2024   Last vitamin B12 and Folate Lab Results  Component Value Date   VITAMINB12 583 06/16/2024      The ASCVD Risk score (Arnett DK, et al., 2019) failed to calculate for the following reasons:   Risk score cannot be calculated because patient has a medical history suggesting prior/existing ASCVD   * -  Cholesterol units were assumed  Outpatient Encounter Medications as of 06/16/2024  Medication Sig   acetaminophen  (TYLENOL ) 500 MG tablet Take 2 tablets (1,000 mg total) by mouth every 6 (six) hours as needed for mild pain.   aspirin  81 MG tablet Take 81 mg by mouth daily.   celecoxib (CELEBREX) 100 MG capsule  Take 100 mg by mouth 2 (two) times daily. (Patient taking differently: Take 100 mg by mouth 2 (two) times daily. Taking PRN)   cetirizine (ZYRTEC) 10 MG tablet Take 10 mg by mouth daily.   clonazePAM  (KLONOPIN ) 0.5 MG tablet Take 1 tablet (0.5 mg total) by mouth at bedtime.   cyclobenzaprine  (FLEXERIL ) 5 MG tablet Take 1 tablet (5 mg total) by mouth 3 (three) times daily as needed for muscle spasms.   diltiazem  (CARDIZEM  CD) 120 MG 24 hr capsule Take 120 mg by mouth daily.   estradiol  (ESTRACE ) 0.01 % CREA vaginal cream Place 0.5g nightly for two weeks then twice a week after   estradiol  (ESTRACE ) 0.5 MG tablet TAKE 1 TABLET BY MOUTH EVERY DAY   furosemide  (LASIX ) 20 MG tablet Take 10 mg by mouth daily as needed.   gabapentin  (NEURONTIN ) 300 MG capsule Take by mouth. Take 1 tablet am and 3 tablets pm daily.   hydrOXYzine  (VISTARIL ) 25 MG capsule TAKE 1 CAPSULE (25 MG TOTAL) BY MOUTH AT BEDTIME AS NEEDED.   isosorbide  mononitrate (IMDUR ) 30 MG 24 hr tablet Take 1 tablet (30 mg total) by mouth 2 (two) times daily.   ketoconazole  (NIZORAL ) 2 % cream Apply 1 Application topically daily.   lidocaine  (XYLOCAINE ) 5 % ointment Use 0.5g nightly up to 3x/day as needed for pain   Melatonin 10 MG TABS Take 10 mg by mouth at bedtime.    metoCLOPramide  (REGLAN ) 10 MG tablet Take 1 tablet (10 mg total) by mouth every 8 (eight) hours as needed (headache, nausea).   nystatin -triamcinolone  ointment (MYCOLOG) Apply 1 Application topically 2 (two) times daily. Apply for no longer than 2 weeks at a time.   ondansetron  (ZOFRAN ) 4 MG tablet Take 1 tablet (4 mg total) by mouth every 8 (eight) hours as needed for nausea.   ondansetron  (ZOFRAN -ODT) 4 MG disintegrating tablet Take 1 tablet (4 mg total) by mouth every 8 (eight) hours as needed.   pantoprazole  (PROTONIX ) 40 MG tablet 40 mg as needed.   ranolazine  (RANEXA ) 1000 MG SR tablet Take 1 tablet (1,000 mg total) by mouth 2 (two) times daily.   rosuvastatin  (CRESTOR )  40 MG tablet TAKE 1 TABLET BY MOUTH EVERY DAY   sotalol  (BETAPACE ) 80 MG tablet Take 1 tablet by mouth daily.   sucralfate (CARAFATE) 1 g tablet Take 1 g by mouth.   SUMAtriptan  (IMITREX ) 50 MG tablet TAKE 1 TABLET BY MOUTH EVERY 2 HOURS AS NEEDED FOR MIGRAINE. MAY REPEAT IN 2 HOURS IF HEADACHE PERSISTS OR RECURS.   tiZANidine  (ZANAFLEX ) 2 MG tablet Take 2 mg by mouth every 8 (eight) hours as needed.   Vitamin D , Ergocalciferol , (DRISDOL ) 1.25 MG (50000 UNIT) CAPS capsule TAKE ONE CAPSULE BY MOUTH EVERY WEEK   VOQUEZNA TRIPLE PAK 500-500-20 MG THPK 500 mg 2 (two) times daily.   No facility-administered encounter medications on file as of 06/16/2024.       Assessment & Plan:   Problem List Items Addressed This Visit   None Visit Diagnoses       Other fatigue    -  Primary   Relevant Orders   VITAMIN D   25 Hydroxy (Vit-D Deficiency, Fractures) (Completed)   Vitamin B12 (Completed)   CBC (Completed)   CMP14+EGFR (Completed)   EKG 12-Lead   TSH + free T4     Nausea       Relevant Medications   ondansetron  (ZOFRAN -ODT) 4 MG disintegrating tablet   Other Relevant Orders   VITAMIN D  25 Hydroxy (Vit-D Deficiency, Fractures) (Completed)   Vitamin B12 (Completed)   CBC (Completed)   CMP14+EGFR (Completed)   EKG 12-Lead   TSH + free T4     SOB (shortness of breath)       Relevant Orders   VITAMIN D  25 Hydroxy (Vit-D Deficiency, Fractures) (Completed)   Vitamin B12 (Completed)   CBC (Completed)   CMP14+EGFR (Completed)   EKG 12-Lead   TSH + free T4       Assessment and Plan Assessment & Plan Migraine headache Migraine headaches are less frequent but still bothersome. Imitrex  50 mg was not effective. Flexeril  5 mg at bedtime is used for muscle relaxation. Nurtec is being considered as an alternative treatment. - Continue Flexeril  5 mg at bedtime - Initiated Nurtec for migraine management  Nausea and fatigue Nausea and fatigue have developed over the past week. Metoclopramide   10 mg has been used as needed for nausea. Fatigue may be related to anemia, vitamin deficiencies, or other underlying conditions. Shortness of breath and fatigue could indicate cardiac issues, warranting further investigation. - Administered anti-nausea injection for immediate relief - Prescribed Zofran  for nausea, to be taken every 8 hours as needed - Ordered CBC, CMP, vitamin B12, and vitamin D  levels to evaluate for anemia and vitamin deficiencies - Ordered EKG to assess cardiac function  Shortness of breath Acute symptoms  Intermittent shortness of breath, possibly related to cardiac issues or other underlying conditions. No chest pain or palpitations reported. - Ordered EKG to assess cardiac function   Indication/Reason for EKG: SOB Comparison to prior EKG: similar  Findings: no ischemia, no AF, so AV block  Conclusion: normal ecg   Original ECG tracing submitted to be scanned into chart  Date: 06/19/24    Helicobacter pylori infection Persistent H. pylori infection despite antibiotic treatment. Referral to infectious disease specialist for management of resistant infection. Pt is s/p treatment  - Continue follow-up with infectious disease specialist for H. pylori management  General health maintenance Mammogram is due. - Ensure mammogram is scheduled and completed    Return in about 3 months (around 09/14/2024) for Chronic F/U,fatigue.    Rockie Agent, MD [facility]  "

## 2024-06-16 NOTE — Patient Instructions (Signed)

## 2024-06-17 ENCOUNTER — Ambulatory Visit: Payer: Self-pay | Admitting: Family Medicine

## 2024-06-17 DIAGNOSIS — E875 Hyperkalemia: Secondary | ICD-10-CM

## 2024-06-17 LAB — CMP14+EGFR
ALT: 10 IU/L (ref 0–32)
AST: 19 IU/L (ref 0–40)
Albumin: 4.4 g/dL (ref 3.9–4.9)
Alkaline Phosphatase: 93 IU/L (ref 49–135)
BUN/Creatinine Ratio: 19 (ref 12–28)
BUN: 19 mg/dL (ref 8–27)
Bilirubin Total: <0.2 mg/dL (ref 0.0–1.2)
CO2: 22 mmol/L (ref 20–29)
Calcium: 9.5 mg/dL (ref 8.7–10.3)
Chloride: 99 mmol/L (ref 96–106)
Creatinine, Ser: 0.99 mg/dL (ref 0.57–1.00)
Globulin, Total: 2.7 g/dL (ref 1.5–4.5)
Glucose: 95 mg/dL (ref 70–99)
Potassium: 5.6 mmol/L — ABNORMAL HIGH (ref 3.5–5.2)
Sodium: 136 mmol/L (ref 134–144)
Total Protein: 7.1 g/dL (ref 6.0–8.5)
eGFR: 63 mL/min/1.73

## 2024-06-17 LAB — CBC
Hematocrit: 38.2 % (ref 34.0–46.6)
Hemoglobin: 12.1 g/dL (ref 11.1–15.9)
MCH: 27.9 pg (ref 26.6–33.0)
MCHC: 31.7 g/dL (ref 31.5–35.7)
MCV: 88 fL (ref 79–97)
Platelets: 300 x10E3/uL (ref 150–450)
RBC: 4.33 x10E6/uL (ref 3.77–5.28)
RDW: 13.2 % (ref 11.7–15.4)
WBC: 8.5 x10E3/uL (ref 3.4–10.8)

## 2024-06-17 LAB — TSH+FREE T4
Free T4: 1.13 ng/dL (ref 0.82–1.77)
TSH: 1.48 u[IU]/mL (ref 0.450–4.500)

## 2024-06-17 LAB — VITAMIN D 25 HYDROXY (VIT D DEFICIENCY, FRACTURES): Vit D, 25-Hydroxy: 54.2 ng/mL (ref 30.0–100.0)

## 2024-06-17 LAB — VITAMIN B12: Vitamin B-12: 583 pg/mL (ref 232–1245)

## 2024-06-17 MED ORDER — LOKELMA 10 G PO PACK: 10.0000 g | PACK | Freq: Every day | ORAL | 0 refills | Status: AC

## 2024-06-20 ENCOUNTER — Other Ambulatory Visit: Payer: Self-pay | Admitting: Family Medicine

## 2024-06-20 DIAGNOSIS — R110 Nausea: Secondary | ICD-10-CM

## 2024-06-22 NOTE — Telephone Encounter (Signed)
 Zofran  replaced with compazine

## 2024-06-23 ENCOUNTER — Other Ambulatory Visit (HOSPITAL_COMMUNITY): Payer: Self-pay

## 2024-06-24 ENCOUNTER — Telehealth: Payer: Self-pay | Admitting: Family Medicine

## 2024-06-24 ENCOUNTER — Ambulatory Visit: Payer: Self-pay | Admitting: *Deleted

## 2024-06-24 NOTE — Telephone Encounter (Signed)
 Reviewed phone note, pt will be evaluated in the ED with further evaluation for hyperkalemia

## 2024-06-24 NOTE — Telephone Encounter (Signed)
 FYI Only or Action Required?: FYI only for provider: ED advised.  Patient was last seen in primary care on 06/16/2024 by Sharma Coyer, MD.  Called Nurse Triage reporting Fatigue (Fatigue, dizziness).  Symptoms began today.  Interventions attempted: Rest, hydration, or home remedies.  Symptoms are: gradually worsening.  Triage Disposition: Go to ED Now (or PCP Triage)  Patient/caregiver understands and will follow disposition?: Yes   Message from [name] sent at 06/24/2024 11:37 AM EST  Reason for Triage: Dizzy and nausea and not sleeping well.   Reason for Disposition  SEVERE dizziness (e.g., unable to stand, requires support to walk, feels like passing out now)  Answer Assessment - Initial Assessment Questions Patient is concerned about the high cost of Rx to treat her elevated potasium level. She is having increased symptoms of dizziness-she feels faint at times and fatigue. Patient states she did not sleep well last night and feels that may be effecting her as well. Patient advised to go to ED per protocol- she is reluctant but has agreed to go. Patient advised 911 for increased symptoms.    1. DESCRIPTION: Describe your dizziness.     Felt faint- patient had to lay down 2. LIGHTHEADED: Do you feel lightheaded? (e.g., somewhat faint, woozy, weak upon standing)     Somewhat faint 3. VERTIGO: Do you feel like either you or the room is spinning or tilting? (i.e., vertigo)     no 4. SEVERITY: How bad is it?  Do you feel like you are going to faint? Can you stand and walk?     severe 5. ONSET:  When did the dizziness begin?     Few days- worse today  7. HEART RATE: Can you tell me your heart rate? How many beats in 15 seconds?  (Note: Not all patients can do this.)       190/96, 86, 183/97, 76 8. CAUSE: What do you think is causing the dizziness? (e.g., decreased fluids or food, diarrhea, emotional distress, heat exposure, new medicine, sudden  standing, vomiting; unknown)     Abnormal +K, did not sleep well last night  10. OTHER SYMPTOMS: Do you have any other symptoms? (e.g., fever, chest pain, vomiting, diarrhea, bleeding)       Fatigue, did not sleep well last  Protocols used: Dizziness - Lightheadedness-A-AH

## 2024-06-24 NOTE — Telephone Encounter (Signed)
 Reviewed telephone report   Agree with urgent evaluation as recommended

## 2024-06-24 NOTE — Telephone Encounter (Signed)
 Copied from CRM [id number]. Topic: Clinical - Medication Question >> Jun 24, 2024 11:34 AM [name] wrote: Reason for CRM: Patient called in to request to know if there is an alternative for the medication to lower her potassium. Per patient she was being charged like 87+ dollars and would like to know if MD can prescribe something else.  sodium zirconium cyclosilicate  (LOKELMA ) 10 g PACK packet.   May you please assist.

## 2024-06-28 ENCOUNTER — Ambulatory Visit: Admitting: Family Medicine

## 2024-06-29 ENCOUNTER — Telehealth: Payer: Self-pay

## 2024-06-29 DIAGNOSIS — E875 Hyperkalemia: Secondary | ICD-10-CM

## 2024-06-29 MED ORDER — SODIUM POLYSTYRENE SULFONATE PO POWD: Freq: Once | ORAL | 0 refills | Status: AC

## 2024-06-29 NOTE — Telephone Encounter (Signed)
 Copied from CRM [id number]. Topic: Clinical - Medication Question >> Jun 29, 2024  1:57 PM [name] wrote: Reason for CRM: Patient;'s husband called in to request to know if there is an alternative for the medication to lower her potassium. Per patient  and her spouse she was being charged like 85 dollars and would like to know if MD can prescribe something else.  sodium zirconium cyclosilicate  (LOKELMA ) 10 g PACK packet.  This is the second call regarding same issue and no update on alternative.    May you please assist.

## 2024-06-29 NOTE — Addendum Note (Signed)
 Addended by: SIMMONS-ROBINSON, Luster Hechler L on: 06/29/2024 04:49 PM   Modules accepted: Orders

## 2024-06-29 NOTE — Telephone Encounter (Signed)
 Please review and advise.

## 2024-06-29 NOTE — Telephone Encounter (Signed)
 Please clarify if patient was evaluated in the ED on 06/24/24 as advised.   If she was not, I have sent in a replacement to help lower her potassium and she will need to return for a lab only visit to repeat a BMP for hyperkalemia within three days of taking a dose of the medication while limiting potassium rich foods in the diet   such as bananas, baked potato, melons, citrus juice, avocado, tomatoes, lima beans, spinach, cantaloupe, raisins, oranges

## 2024-07-01 NOTE — Telephone Encounter (Signed)
 Plan requested additional information. Requested information has been faxed to plan. Status is still pending.

## 2024-07-06 ENCOUNTER — Ambulatory Visit

## 2024-07-27 ENCOUNTER — Ambulatory Visit: Admitting: Podiatry

## 2024-09-28 ENCOUNTER — Ambulatory Visit: Admitting: Family Medicine

## 2024-11-22 ENCOUNTER — Ambulatory Visit: Admitting: Physician Assistant

## 2025-04-04 ENCOUNTER — Ambulatory Visit
